# Patient Record
Sex: Female | Born: 1935 | Race: White | Hispanic: No | Smoking: Never smoker
Health system: Southern US, Community
[De-identification: ages and names within clinical notes are randomized; demographics above are authoritative.]

## PROBLEM LIST (undated history)

## (undated) DIAGNOSIS — K5900 Constipation, unspecified: Secondary | ICD-10-CM

## (undated) DIAGNOSIS — IMO0002 Reserved for concepts with insufficient information to code with codable children: Secondary | ICD-10-CM

## (undated) DIAGNOSIS — R5383 Other fatigue: Secondary | ICD-10-CM

## (undated) DIAGNOSIS — I4891 Unspecified atrial fibrillation: Secondary | ICD-10-CM

## (undated) DIAGNOSIS — I739 Peripheral vascular disease, unspecified: Secondary | ICD-10-CM

## (undated) DIAGNOSIS — N951 Menopausal and female climacteric states: Secondary | ICD-10-CM

## (undated) DIAGNOSIS — I10 Essential (primary) hypertension: Secondary | ICD-10-CM

## (undated) DIAGNOSIS — E785 Hyperlipidemia, unspecified: Secondary | ICD-10-CM

## (undated) DIAGNOSIS — N362 Urethral caruncle: Secondary | ICD-10-CM

## (undated) DIAGNOSIS — I493 Ventricular premature depolarization: Secondary | ICD-10-CM

## (undated) DIAGNOSIS — D649 Anemia, unspecified: Secondary | ICD-10-CM

## (undated) DIAGNOSIS — I495 Sick sinus syndrome: Secondary | ICD-10-CM

## (undated) DIAGNOSIS — C4491 Basal cell carcinoma of skin, unspecified: Secondary | ICD-10-CM

## (undated) DIAGNOSIS — R339 Retention of urine, unspecified: Secondary | ICD-10-CM

## (undated) DIAGNOSIS — R002 Palpitations: Secondary | ICD-10-CM

## (undated) DIAGNOSIS — I491 Atrial premature depolarization: Secondary | ICD-10-CM

## (undated) DIAGNOSIS — R194 Change in bowel habit: Secondary | ICD-10-CM

## (undated) DIAGNOSIS — N6019 Diffuse cystic mastopathy of unspecified breast: Secondary | ICD-10-CM

## (undated) DIAGNOSIS — B019 Varicella without complication: Secondary | ICD-10-CM

## (undated) DIAGNOSIS — I499 Cardiac arrhythmia, unspecified: Secondary | ICD-10-CM

## (undated) DIAGNOSIS — R634 Abnormal weight loss: Secondary | ICD-10-CM

## (undated) DIAGNOSIS — R3915 Urgency of urination: Secondary | ICD-10-CM

## (undated) DIAGNOSIS — M76899 Other specified enthesopathies of unspecified lower limb, excluding foot: Secondary | ICD-10-CM

## (undated) DIAGNOSIS — J309 Allergic rhinitis, unspecified: Secondary | ICD-10-CM

## (undated) DIAGNOSIS — B029 Zoster without complications: Secondary | ICD-10-CM

## (undated) HISTORY — DX: Menopausal and female climacteric states: N95.1

## (undated) HISTORY — PX: ABLATION: SHX5711

## (undated) HISTORY — DX: Reserved for concepts with insufficient information to code with codable children: IMO0002

## (undated) HISTORY — DX: Sick sinus syndrome: I49.5

## (undated) HISTORY — PX: HIP SURGERY: SHX245

## (undated) HISTORY — PX: CHOLECYSTECTOMY: SHX55

## (undated) HISTORY — DX: Hyperlipidemia, unspecified: E78.5

## (undated) HISTORY — DX: Cardiac arrhythmia, unspecified: I49.9

## (undated) HISTORY — DX: Other specified enthesopathies of unspecified lower limb, excluding foot: M76.899

## (undated) HISTORY — DX: Retention of urine, unspecified: R33.9

## (undated) HISTORY — DX: Other fatigue: R53.83

## (undated) HISTORY — PX: APPENDECTOMY: SHX54

## (undated) HISTORY — DX: Allergic rhinitis, unspecified: J30.9

## (undated) HISTORY — PX: CARDIAC SURGERY: SHX584

## (undated) HISTORY — DX: Zoster without complications: B02.9

## (undated) HISTORY — DX: Constipation, unspecified: K59.00

## (undated) HISTORY — DX: Urgency of urination: R39.15

## (undated) HISTORY — DX: Urethral caruncle: N36.2

## (undated) HISTORY — DX: Change in bowel habit: R19.4

## (undated) HISTORY — PX: BREAST CYST ASPIRATION: SHX578

## (undated) HISTORY — PX: BASAL CELL CARCINOMA EXCISION: SHX1214

## (undated) HISTORY — PX: TOTAL ABDOMINAL HYSTERECTOMY W/ BILATERAL SALPINGOOPHORECTOMY: SHX83

## (undated) HISTORY — DX: Diffuse cystic mastopathy of unspecified breast: N60.19

## (undated) HISTORY — DX: Basal cell carcinoma of skin, unspecified: C44.91

## (undated) HISTORY — DX: Palpitations: R00.2

## (undated) HISTORY — PX: CARDIOVERSION: SHX1299

## (undated) HISTORY — DX: Atrial premature depolarization: I49.1

## (undated) HISTORY — DX: Unspecified atrial fibrillation: I48.91

## (undated) HISTORY — DX: Anemia, unspecified: D64.9

## (undated) HISTORY — DX: Abnormal weight loss: R63.4

## (undated) HISTORY — DX: Ventricular premature depolarization: I49.3

## (undated) HISTORY — DX: Varicella without complication: B01.9

## (undated) HISTORY — PX: WISDOM TOOTH EXTRACTION: SHX21

## (undated) HISTORY — DX: Peripheral vascular disease, unspecified: I73.9

## (undated) HISTORY — DX: Essential (primary) hypertension: I10

## (undated) HISTORY — PX: WRIST FRACTURE SURGERY: SHX121

---

## 2004-04-09 ENCOUNTER — Ambulatory Visit: Payer: Self-pay | Admitting: Gastroenterology

## 2004-11-10 ENCOUNTER — Ambulatory Visit: Payer: Self-pay | Admitting: Unknown Physician Specialty

## 2005-12-07 ENCOUNTER — Ambulatory Visit: Payer: Self-pay | Admitting: Unknown Physician Specialty

## 2007-01-10 ENCOUNTER — Ambulatory Visit: Payer: Self-pay | Admitting: Unknown Physician Specialty

## 2007-04-29 DIAGNOSIS — B019 Varicella without complication: Secondary | ICD-10-CM

## 2007-04-29 DIAGNOSIS — B029 Zoster without complications: Secondary | ICD-10-CM

## 2007-04-29 HISTORY — DX: Varicella without complication: B01.9

## 2007-04-29 HISTORY — DX: Zoster without complications: B02.9

## 2008-01-24 ENCOUNTER — Ambulatory Visit: Payer: Self-pay | Admitting: Unknown Physician Specialty

## 2009-02-02 ENCOUNTER — Ambulatory Visit: Payer: Self-pay | Admitting: Unknown Physician Specialty

## 2010-02-08 ENCOUNTER — Ambulatory Visit: Payer: Self-pay | Admitting: Unknown Physician Specialty

## 2011-04-19 ENCOUNTER — Ambulatory Visit: Payer: Self-pay | Admitting: Unknown Physician Specialty

## 2012-04-20 ENCOUNTER — Ambulatory Visit: Payer: Self-pay | Admitting: Physician Assistant

## 2012-09-18 DIAGNOSIS — C44310 Basal cell carcinoma of skin of unspecified parts of face: Secondary | ICD-10-CM | POA: Insufficient documentation

## 2013-03-28 HISTORY — PX: CATARACT EXTRACTION: SUR2

## 2013-04-04 ENCOUNTER — Ambulatory Visit: Payer: Self-pay | Admitting: Ophthalmology

## 2013-04-04 DIAGNOSIS — I10 Essential (primary) hypertension: Secondary | ICD-10-CM

## 2013-04-24 ENCOUNTER — Ambulatory Visit: Payer: Self-pay | Admitting: Physician Assistant

## 2013-05-08 ENCOUNTER — Ambulatory Visit: Payer: Self-pay | Admitting: Ophthalmology

## 2013-09-10 ENCOUNTER — Ambulatory Visit: Payer: Self-pay | Admitting: Ophthalmology

## 2014-04-23 DIAGNOSIS — I480 Paroxysmal atrial fibrillation: Secondary | ICD-10-CM | POA: Insufficient documentation

## 2014-04-23 DIAGNOSIS — E782 Mixed hyperlipidemia: Secondary | ICD-10-CM | POA: Insufficient documentation

## 2014-04-23 DIAGNOSIS — I340 Nonrheumatic mitral (valve) insufficiency: Secondary | ICD-10-CM | POA: Insufficient documentation

## 2014-05-22 ENCOUNTER — Ambulatory Visit: Payer: Self-pay | Admitting: Physician Assistant

## 2014-07-19 NOTE — Op Note (Signed)
PATIENT NAME:  Lisa [name], Lisa [name] MR#:  [id number] DATE OF BIRTH:  [birth year]  DATE OF PROCEDURE:  05/08/2013  PREOPERATIVE DIAGNOSIS:  Senile cataract left eye.  POSTOPERATIVE DIAGNOSIS:  Senile cataract left eye.  PROCEDURE:  Phacoemulsification with posterior chamber intraocular lens implantation of the left eye.  LENS:  ZCB00 22.0-diopter posterior chamber intraocular lens.  ULTRASOUND TIME:  16% of 1 minute, 42 seconds.  CDE 16.0  SURGEON:  Mali Bethanee Redondo, MD  ANESTHESIA:  Topical with tetracaine drops and 2% Xylocaine jelly.  COMPLICATIONS:  None.  DESCRIPTION OF PROCEDURE:  The patient was identified in the holding room and transported to the operating room and placed in the supine position under the operating microscope.  The left eye was identified as the operative eye and it was prepped and draped in the usual sterile ophthalmic fashion.  A 1 millimeter clear-corneal paracentesis was made at the 1:30 position.  The anterior chamber was filled with Viscoat viscoelastic.  A 2.4 millimeter keratome was used to make a near-clear corneal incision at the 10:30 position.  A curvilinear capsulorrhexis was made with a cystotome and capsulorrhexis forceps.  Balanced salt solution was used to hydrodissect and hydrodelineate the nucleus.  Phacoemulsification was then used in stop and chop fashion to remove the lens nucleus and epinucleus.  The remaining cortex was then removed using the irrigation and aspiration handpiece. Provisc was then placed into the capsular bag to distend it for lens placement.  A ZCB00 22.0-diopter lens was then injected into the capsular bag.  The remaining viscoelastic was aspirated.  Wounds were hydrated with balanced salt solution.  The anterior chamber was inflated to a physiologic pressure with balanced salt solution.  0.1 mL of cefuroxime 10 mg/mL were injected into the anterior chamber for a dose of 1 mg of intracameral antibiotic at the completion of the case.  Miostat was placed into the anterior chamber to constrict the pupil.  No wound leaks were noted.  Topical Vigamox drops and Maxitrol ointment were applied to the eye.  The patient was taken to the recovery room in stable condition without complications of anesthesia or surgery.    ____________________________ Wyonia Hough, MD crb:dp D: 05/08/2013 14:42:57 ET T: 05/08/2013 15:25:30 ET JOB#: [id number]  cc: Wyonia Hough, MD, <Dictator> Leandrew Koyanagi MD ELECTRONICALLY SIGNED 05/15/2013 12:12

## 2014-07-19 NOTE — Op Note (Signed)
PATIENT NAME:  Lisa [name], Lisa [name] MR#:  [id number] DATE OF BIRTH:  1928/11/27  DATE OF PROCEDURE:  09/10/2013  PREOPERATIVE DIAGNOSIS:  Senile cataract right eye.  POSTOPERATIVE DIAGNOSIS:  Senile cataract right eye.  PROCEDURE:  Phacoemulsification with posterior chamber intraocular lens implantation of the right eye.  LENS IMPLANT:  ZCB00, 22.5-diopter posterior chamber intraocular lens.  ULTRASOUND TIME:  11% of  1 minute 2 seconds.  CDE 6.6.  SURGEON:  Mali Brasington, MD  ANESTHESIA:  Topical with tetracaine drops and 2% Xylocaine jelly.  COMPLICATIONS:  None.  DESCRIPTION OF PROCEDURE:  The patient was identified in the holding room and transported to the operating room and placed in the supine position under the operating microscope.  The right eye was identified as the operative eye and it was prepped and draped in the usual sterile ophthalmic fashion.  A 1 millimeter clear-corneal paracentesis was made at the 12 o'clock position.  The anterior chamber was filled with Viscoat  viscoelastic.  A 2.4 millimeter keratome was used to make a near-clear corneal incision at the  9 o'clock  position.  A curvilinear capsulorrhexis was made with a cystotome and capsulorrhexis forceps.  Balanced salt solution was used to hydrodissect and hydrodelineate the nucleus.  Phacoemulsification was then used in stop and chop fashion to remove the lens nucleus and epinucleus.  The remaining cortex was then removed using the irrigation and aspiration handpiece. Provisc was then placed into the capsular bag to distend it for lens placement.  A ZCB00, 22.5-diopter lens was then injected into the capsular bag.  The remaining viscoelastic was aspirated.  Wounds were hydrated with balanced salt solution.  The anterior chamber was inflated to a physiologic pressure with balanced salt solution.  0.1 mL of cefuroxime 10 mg/mL were injected into the anterior chamber for a dose of 1 mg of intracameral antibiotic at  the completion of the case. Miostat was placed into the anterior chamber to constrict the pupil.  No wound leaks were noted.  Topical Vigamox drops and Maxitrol ointment were applied to the eye.    The patient was taken to the recovery room in stable condition without complications of anesthesia or surgery.     ____________________________ Wyonia Hough, MD crb:dmm D: 09/10/2013 12:09:48 ET T: 09/10/2013 12:23:21 ET JOB#: [id number]  cc: Wyonia Hough, MD, <Dictator> Leandrew Koyanagi MD ELECTRONICALLY SIGNED 09/11/2013 9:38

## 2014-11-13 ENCOUNTER — Encounter: Payer: Self-pay | Admitting: Obstetrics and Gynecology

## 2014-11-13 ENCOUNTER — Ambulatory Visit (INDEPENDENT_AMBULATORY_CARE_PROVIDER_SITE_OTHER): Payer: 59 | Admitting: Obstetrics and Gynecology

## 2014-11-13 VITALS — BP 127/71 | HR 65 | Ht 62.5 in | Wt 133.0 lb

## 2014-11-13 DIAGNOSIS — R339 Retention of urine, unspecified: Secondary | ICD-10-CM | POA: Diagnosis not present

## 2014-11-13 DIAGNOSIS — N8110 Cystocele, unspecified: Secondary | ICD-10-CM | POA: Diagnosis not present

## 2014-11-13 DIAGNOSIS — D649 Anemia, unspecified: Secondary | ICD-10-CM | POA: Insufficient documentation

## 2014-11-13 DIAGNOSIS — I493 Ventricular premature depolarization: Secondary | ICD-10-CM | POA: Insufficient documentation

## 2014-11-13 DIAGNOSIS — R002 Palpitations: Secondary | ICD-10-CM | POA: Insufficient documentation

## 2014-11-13 DIAGNOSIS — I10 Essential (primary) hypertension: Secondary | ICD-10-CM | POA: Insufficient documentation

## 2014-11-13 DIAGNOSIS — I491 Atrial premature depolarization: Secondary | ICD-10-CM | POA: Insufficient documentation

## 2014-11-13 DIAGNOSIS — N815 Vaginal enterocele: Secondary | ICD-10-CM

## 2014-11-13 DIAGNOSIS — IMO0002 Reserved for concepts with insufficient information to code with codable children: Secondary | ICD-10-CM

## 2014-11-13 DIAGNOSIS — I499 Cardiac arrhythmia, unspecified: Secondary | ICD-10-CM | POA: Insufficient documentation

## 2014-11-13 NOTE — Patient Instructions (Signed)
1.  Continue with Premarin 0.3 mg 3 times a week. 2.  Continue with [place] gel weekly. 3.  Return in 3 months for recheck on pessary

## 2014-11-13 NOTE — Progress Notes (Signed)
Chief complaint: 1.  Pessary check.  The patient is an 81 year old white female status post TAH/BSO in 1975, with history of cystocele, incomplete bladder emptying, and vaginal enterocele, who presents for 3 month follow-up on pessary maintenance.  She is using a gelhorn pessary 2-3/4 inchWith success.  She is taking Premarin 0.3 mg orally 3 times a week and is using TRIMO san gel weekly. The patient denies any vaginal bleeding, discharge, or odor.  She is not experiencing any pelvic pain. She does state that occasionally it feels that the pessary slips to the left with some bladder prolapse.  Past Medical History  Diagnosis Date  . Hyperlipemia   . Hypertension   . Constipation   . Basal cell carcinoma   . Cystocele   . Incomplete bladder emptying   . Menopausal state   . Urethral caruncle   . Urinary urgency    Past Surgical History  Procedure Laterality Date  . Appendectomy    . Cholecystectomy    . Abdominal hysterectomy  1975    and bso  . Cataract extraction    . Basal cell carcinoma excision      Review of Systems  Constitutional: Negative.   HENT:       Recent I, tooth loss  Respiratory: Negative.   Gastrointestinal: Negative.   Genitourinary: Negative.   Musculoskeletal: Negative.   Skin: Negative.    OBJECTIVE: BP 127/71 mmHg  Pulse 65  Ht 5' 2.5" (1.588 m)  Wt 133 lb (60.328 kg)  BMI 23.92 kg/m2 Pleasant, well-appearing white female in no acute distress who appears younger than stated age. Abdomen soft, nontender. Pelvic exam: External genitalia-atrophic changes. BUS-normal. Vagina-vaginal mucosa appears healthy without evidence of ulceration or lesion; minimal white discharge noted; no odor. Cervix-surgically absent. Uterus-surgically absent. Rectovaginal-external exam normal.  Procedure: The gel horn pessary is removed, cleaned, and reinserted.  ASSESSMENT: 1.  Pelvic organ prolapse, stable with pessary use. 2.  Cystocele. 3.  Enterocele. 4.   Normal pessary check.-3 months.  PLAN: 1.  Continue with Premarin 0.3 mg 3 times a week. 2.  Continue with TRIMO san gel weekly 3.  Patient may attempt to adjust pessary if she notes a slipping of the pessary to the side with bladder prolapse. 4.  Return in 3 months for recheck.  Brayton Mars, MD

## 2014-11-13 NOTE — Progress Notes (Signed)
Patient ID: Lisa [name], female   DOB: Aug 08, 1928, 81 y.o.   MRN: [id number] 3 month pessary check No vb,vd, ordor, or pain trimosan- weekly No uti sx

## 2014-12-08 DIAGNOSIS — I6523 Occlusion and stenosis of bilateral carotid arteries: Secondary | ICD-10-CM | POA: Insufficient documentation

## 2015-02-10 ENCOUNTER — Other Ambulatory Visit: Payer: Self-pay | Admitting: Obstetrics and Gynecology

## 2015-02-12 ENCOUNTER — Ambulatory Visit (INDEPENDENT_AMBULATORY_CARE_PROVIDER_SITE_OTHER): Payer: 59 | Admitting: Obstetrics and Gynecology

## 2015-02-12 ENCOUNTER — Encounter: Payer: Self-pay | Admitting: Obstetrics and Gynecology

## 2015-02-12 VITALS — BP 133/84 | HR 65 | Ht 62.5 in | Wt 138.1 lb

## 2015-02-12 DIAGNOSIS — IMO0002 Reserved for concepts with insufficient information to code with codable children: Secondary | ICD-10-CM

## 2015-02-12 DIAGNOSIS — N8110 Cystocele, unspecified: Secondary | ICD-10-CM

## 2015-02-12 DIAGNOSIS — N815 Vaginal enterocele: Secondary | ICD-10-CM | POA: Diagnosis not present

## 2015-02-12 DIAGNOSIS — R339 Retention of urine, unspecified: Secondary | ICD-10-CM | POA: Diagnosis not present

## 2015-02-12 DIAGNOSIS — K469 Unspecified abdominal hernia without obstruction or gangrene: Secondary | ICD-10-CM

## 2015-02-12 NOTE — Patient Instructions (Signed)
1 

## 2015-02-12 NOTE — Progress Notes (Signed)
Patient ID: Lisa [name], female   DOB: 06/02/1928, 81 y.o.   MRN: [id number]   Chief complaint: 1. Pessary check 2. History of incomplete bladder emptying 3. History of cystocele, vaginal enterocele  3 month pessary check-2 and three-quarter inch Gellhorn pessary uti- 3 weeks ago treated with cipro at pcp- no sx today No vb, pain, d/c  Past medical history, past surgical history, problem list, medications, and allergies are reviewed. Status postTAH/BSO   Review of systems: Per history of present illness  OBJECTIVE: BP 133/84 mmHg  Pulse 65  Ht 5' 2.5" (1.588 m)  Wt 138 lb 1.6 oz (62.642 kg)  BMI 24.84 kg/m2  Pleasant, well-appearing white female in no acute distress who appears younger than stated age. Abdomen soft, nontender. Pelvic exam: External genitalia-atrophic changes. BUS-normal. Vagina-vaginal mucosa appears healthy without evidence of ulceration or lesion; minimal white discharge noted; no odor. Cervix-surgically absent. Uterus-surgically absent. Rectovaginal-external exam normal.  Procedure: The gel horn pessary is removed, cleaned, and reinserted.  ASSESSMENT: 1. Pelvic organ prolapse, stable with pessary use. 2. Cystocele. 3. Enterocele. 4. Normal pessary check.-3 months.  PLAN: 1. Continue with Premarin 0.3 mg 3 times a week. 2. Continue with TRIMO san gel weekly 3. Patient may attempt to adjust pessary if she notes a slipping of the pessary to the side with bladder prolapse. 4. Return in 3 months for recheck.  Brayton Mars, MD  Note: This dictation was prepared with Dragon dictation along with smaller phrase technology. Any transcriptional errors that result from this process are unintentional.

## 2015-03-03 ENCOUNTER — Telehealth: Payer: Self-pay | Admitting: Obstetrics and Gynecology

## 2015-03-03 NOTE — Telephone Encounter (Signed)
[facility] [address] told this pt that Dr Tennis Must needed to call ins company in order for her Premarin to be filled.

## 2015-03-03 NOTE — Telephone Encounter (Signed)
Pt was advised her insurance will not pay for premarin tablets. Advised per mad she may try to pay OOP for premarin. If not she can make an appt to discuss alternatives that her insurance will pay for. Pt will call me back with her decision.

## 2015-05-14 ENCOUNTER — Ambulatory Visit (INDEPENDENT_AMBULATORY_CARE_PROVIDER_SITE_OTHER): Payer: 59 | Admitting: Obstetrics and Gynecology

## 2015-05-14 ENCOUNTER — Encounter: Payer: Self-pay | Admitting: Obstetrics and Gynecology

## 2015-05-14 VITALS — BP 155/89 | HR 86 | Ht 62.5 in | Wt 142.7 lb

## 2015-05-14 DIAGNOSIS — N815 Vaginal enterocele: Secondary | ICD-10-CM | POA: Diagnosis not present

## 2015-05-14 DIAGNOSIS — R339 Retention of urine, unspecified: Secondary | ICD-10-CM

## 2015-05-14 DIAGNOSIS — N8110 Cystocele, unspecified: Secondary | ICD-10-CM

## 2015-05-14 DIAGNOSIS — IMO0002 Reserved for concepts with insufficient information to code with codable children: Secondary | ICD-10-CM

## 2015-05-14 NOTE — Patient Instructions (Addendum)
1. Continue with Premarin 0.3 mg 3 times a week. 2. Continue with TRIMO san gel weekly 3. Patient may attempt to adjust pessary if she notes a slipping of the pessary to the side with bladder prolapse. 4. Return in 3 months for recheck.

## 2015-05-14 NOTE — Progress Notes (Signed)
Chief complaint: 1. Pessary check 2. History of incomplete bladder emptying 3. History of cystocele, vaginal enterocele  3 month pessary check-2 and three-quarter inch Gellhorn pessary No vb, pain, d/c  Past medical history, past surgical history, problem list, medications, and allergies are reviewed. Status postTAH/BSO   Review of systems: Per history of present illness  OBJECTIVE: BP 155/89 mmHg  Pulse 86  Ht 5' 2.5" (1.588 m)  Wt 142 lb 11.2 oz (64.728 kg)  BMI 25.67 kg/m2  Pleasant, well-appearing white female in no acute distress who appears younger than stated age. Abdomen soft, nontender. Pelvic exam: External genitalia-atrophic changes. BUS-Urethral caruncle present Vagina-vaginal mucosa appears healthy without evidence of ulceration or lesion; minimal white discharge noted; no odor. Cervix-surgically absent. Uterus-surgically absent. Rectovaginal-external exam normal.  Procedure: The gel horn pessary is removed, cleaned, and reinserted.  ASSESSMENT: 1. Pelvic organ prolapse, stable with pessary use. 2. Cystocele. 3. Enterocele. 4. Normal pessary check.-3 months.  PLAN: 1. Patient stopped Premarin due to cost 2. Continue with TRIMO san gel weekly 3. Patient may attempt to adjust pessary if she notes a slipping of the pessary to the side with bladder prolapse. 4. Return in 3 months for recheck.  A total of 15 minutes were spent face-to-face with the patient during this encounter and over half of that time dealt with counseling and coordination of care.   Brayton Mars, MD  Note: This dictation was prepared with Dragon dictation along with smaller phrase technology. Any transcriptional errors that result from this process are unintentional.

## 2015-07-22 ENCOUNTER — Other Ambulatory Visit: Payer: Self-pay | Admitting: Physician Assistant

## 2015-07-22 DIAGNOSIS — Z1231 Encounter for screening mammogram for malignant neoplasm of breast: Secondary | ICD-10-CM

## 2015-08-03 ENCOUNTER — Ambulatory Visit
Admission: RE | Admit: 2015-08-03 | Discharge: 2015-08-03 | Disposition: A | Discharge status: Home / Self Care | Payer: Medicare Other | Source: Ambulatory Visit | Attending: Physician Assistant | Admitting: Physician Assistant

## 2015-08-03 ENCOUNTER — Other Ambulatory Visit: Payer: Self-pay | Admitting: Physician Assistant

## 2015-08-03 DIAGNOSIS — Z1231 Encounter for screening mammogram for malignant neoplasm of breast: Secondary | ICD-10-CM

## 2015-08-13 ENCOUNTER — Encounter: Payer: Self-pay | Admitting: Obstetrics and Gynecology

## 2015-08-13 ENCOUNTER — Ambulatory Visit (INDEPENDENT_AMBULATORY_CARE_PROVIDER_SITE_OTHER): Payer: Medicare Other | Admitting: Obstetrics and Gynecology

## 2015-08-13 VITALS — BP 127/74 | HR 71 | Ht 64.5 in | Wt 145.5 lb

## 2015-08-13 DIAGNOSIS — R339 Retention of urine, unspecified: Secondary | ICD-10-CM | POA: Diagnosis not present

## 2015-08-13 DIAGNOSIS — N815 Vaginal enterocele: Secondary | ICD-10-CM

## 2015-08-13 DIAGNOSIS — Z4689 Encounter for fitting and adjustment of other specified devices: Secondary | ICD-10-CM

## 2015-08-13 DIAGNOSIS — N8110 Cystocele, unspecified: Secondary | ICD-10-CM

## 2015-08-13 DIAGNOSIS — IMO0002 Reserved for concepts with insufficient information to code with codable children: Secondary | ICD-10-CM

## 2015-08-13 NOTE — Progress Notes (Signed)
Chief complaint: 1. Pessary check 2. History of incomplete bladder emptying 3. History of cystocele, vaginal enterocele  3 month pessary check-2 and three-quarter inch Gellhorn pessary No vb, pain, d/c. Patient reports occasional slippage of bladder  around pessary ; she is dealing with this.  Past medical history, past surgical history, problem list, medications, and allergies are reviewed. Status postTAH/BSO   Review of systems: Per history of present illness   OBJECTIVE : BP 127/74 mmHg  Pulse 71  Ht 5' 4.5" (1.638 m)  Wt 145 lb 8 oz (65.998 kg)  BMI 24.60 kg/m2  Pleasant, well-appearing white female in no acute distress who appears younger than stated age. Abdomen soft, nontender. Pelvic exam: External genitalia-atrophic changes. BUS-Urethral caruncle present Vagina-vaginal mucosa appears healthy without evidence of ulceration or lesion; minimal white discharge noted; no odor. Cervix-surgically absent. Uterus-surgically absent. Rectovaginal-external exam normal.  Procedure: The gel horn pessary is removed, cleaned, and reinserted.  ASSESSMENT: 1. Pelvic organ prolapse, stable with pessary use. 2. Cystocele. 3. Enterocele. 4. Normal pessary check.-3 months.   PLAN: 1. Continue Trimosan gel weekly  2. Return in 3 months for follow-up pessary maintenance  3. Discussed the issue of estrogen replacement therapy. Patient understands that estradiol 1 mg a day may be acceptable (cost wise) since discontinuing her Premarin 6 months ago. She would need to discuss the impact of estradiol on her atrial fibrillation condition that has recently been diagnosed. Cardiology may not want her to be on medication if her A. Fib is not well controlled.  A total of 15 minutes were spent face-to-face with the patient during this encounter and over half of that time dealt with counseling and coordination of care.  Brayton Mars, MD  Note: This dictation was prepared with Dragon  dictation along with smaller phrase technology. Any transcriptional errors that result from this process are unintentional.

## 2015-08-13 NOTE — Patient Instructions (Addendum)
1. Return in 3 months for pessary maintenance  2. Patient may consider discussing estrogen replacement therapy with her cardiologist, being that she has not felt as well since coming off of her estrogen  6 months ago.

## 2015-09-02 ENCOUNTER — Ambulatory Visit
Admission: RE | Admit: 2015-09-02 | Discharge: 2015-09-02 | Disposition: A | Discharge status: Home / Self Care | Payer: Medicare Other | Source: Ambulatory Visit | Attending: Internal Medicine | Admitting: Internal Medicine

## 2015-09-02 ENCOUNTER — Ambulatory Visit: Payer: Medicare Other | Admitting: Registered Nurse

## 2015-09-02 ENCOUNTER — Encounter: Payer: Self-pay | Admitting: *Deleted

## 2015-09-02 ENCOUNTER — Encounter
Admission: RE | Disposition: A | Discharge status: Home / Self Care | Payer: Self-pay | Source: Ambulatory Visit | Attending: Internal Medicine

## 2015-09-02 DIAGNOSIS — D649 Anemia, unspecified: Secondary | ICD-10-CM | POA: Diagnosis not present

## 2015-09-02 DIAGNOSIS — N6019 Diffuse cystic mastopathy of unspecified breast: Secondary | ICD-10-CM | POA: Insufficient documentation

## 2015-09-02 DIAGNOSIS — Z9842 Cataract extraction status, left eye: Secondary | ICD-10-CM | POA: Diagnosis not present

## 2015-09-02 DIAGNOSIS — Z90710 Acquired absence of both cervix and uterus: Secondary | ICD-10-CM | POA: Insufficient documentation

## 2015-09-02 DIAGNOSIS — Z8619 Personal history of other infectious and parasitic diseases: Secondary | ICD-10-CM | POA: Diagnosis not present

## 2015-09-02 DIAGNOSIS — Z7901 Long term (current) use of anticoagulants: Secondary | ICD-10-CM | POA: Insufficient documentation

## 2015-09-02 DIAGNOSIS — E784 Other hyperlipidemia: Secondary | ICD-10-CM | POA: Insufficient documentation

## 2015-09-02 DIAGNOSIS — Z79899 Other long term (current) drug therapy: Secondary | ICD-10-CM | POA: Diagnosis not present

## 2015-09-02 DIAGNOSIS — Z803 Family history of malignant neoplasm of breast: Secondary | ICD-10-CM | POA: Diagnosis not present

## 2015-09-02 DIAGNOSIS — Z9049 Acquired absence of other specified parts of digestive tract: Secondary | ICD-10-CM | POA: Insufficient documentation

## 2015-09-02 DIAGNOSIS — Z8249 Family history of ischemic heart disease and other diseases of the circulatory system: Secondary | ICD-10-CM | POA: Diagnosis not present

## 2015-09-02 DIAGNOSIS — I495 Sick sinus syndrome: Secondary | ICD-10-CM | POA: Diagnosis not present

## 2015-09-02 DIAGNOSIS — Z9841 Cataract extraction status, right eye: Secondary | ICD-10-CM | POA: Insufficient documentation

## 2015-09-02 DIAGNOSIS — Z801 Family history of malignant neoplasm of trachea, bronchus and lung: Secondary | ICD-10-CM | POA: Diagnosis not present

## 2015-09-02 DIAGNOSIS — M792 Neuralgia and neuritis, unspecified: Secondary | ICD-10-CM | POA: Diagnosis not present

## 2015-09-02 DIAGNOSIS — Z8049 Family history of malignant neoplasm of other genital organs: Secondary | ICD-10-CM | POA: Diagnosis not present

## 2015-09-02 DIAGNOSIS — Z9889 Other specified postprocedural states: Secondary | ICD-10-CM | POA: Insufficient documentation

## 2015-09-02 DIAGNOSIS — I10 Essential (primary) hypertension: Secondary | ICD-10-CM | POA: Diagnosis not present

## 2015-09-02 DIAGNOSIS — I480 Paroxysmal atrial fibrillation: Secondary | ICD-10-CM | POA: Diagnosis present

## 2015-09-02 HISTORY — PX: ELECTROPHYSIOLOGIC STUDY: SHX172A

## 2015-09-02 SURGERY — CARDIOVERSION (CATH LAB)
Anesthesia: General

## 2015-09-02 MED ORDER — SODIUM CHLORIDE 0.9 % IV SOLN
INTRAVENOUS | Status: DC
  Administered 2015-09-02: 08:00:00 via INTRAVENOUS

## 2015-09-02 MED ORDER — PROPOFOL 10 MG/ML IV BOLUS
INTRAVENOUS | Status: DC | PRN
  Administered 2015-09-02: 40 mg via INTRAVENOUS
  Administered 2015-09-02: 10 mg via INTRAVENOUS

## 2015-09-02 NOTE — Anesthesia Postprocedure Evaluation (Signed)
Anesthesia Post Note  Patient: [name]  Procedure(s) Performed: Procedure(s) (LRB): Cardioversion (N/A)  Patient location during evaluation: PACU Anesthesia Type: General Level of consciousness: awake and alert Pain management: pain level controlled Vital Signs Assessment: post-procedure vital signs reviewed and stable Respiratory status: spontaneous breathing, nonlabored ventilation, respiratory function stable and patient connected to nasal cannula oxygen Cardiovascular status: blood pressure returned to baseline and stable Postop Assessment: no signs of nausea or vomiting Anesthetic complications: no    Last Vitals:  Filed Vitals:   09/02/15 0807 09/02/15 0823  BP:  136/81  Pulse: 65 70  Temp:    Resp: 13 18    Last Pain: There were no vitals filed for this visit.               Martha Clan

## 2015-09-02 NOTE — Discharge Instructions (Signed)
Electrical Cardioversion, Care After °Refer to this sheet in the next few weeks. These instructions provide you with information on caring for yourself after your procedure. Your health care provider may also give you more specific instructions. Your treatment has been planned according to current medical practices, but problems sometimes occur. Call your health care provider if you have any problems or questions after your procedure. °WHAT TO EXPECT AFTER THE PROCEDURE °After your procedure, it is typical to have the following sensations: °· Some redness on the skin where the shocks were delivered. If this is tender, a sunburn lotion or hydrocortisone cream may help. °· Possible return of an abnormal heart rhythm within hours or days after the procedure. °HOME CARE INSTRUCTIONS °· Take medicines only as directed by your health care provider. Be sure you understand how and when to take your medicine. °· Learn how to feel your pulse and check it often. °· Limit your activity for 48 hours after the procedure or as directed by your health care provider. °· Avoid or minimize caffeine and other stimulants as directed by your health care provider. °SEEK MEDICAL CARE IF: °· You feel like your heart is beating too fast or your pulse is not regular. °· You have any questions about your medicines. °· You have bleeding that will not stop. °SEEK IMMEDIATE MEDICAL CARE IF: °· You are dizzy or feel faint. °· It is hard to breathe or you feel short of breath. °· There is a change in discomfort in your chest. °· Your speech is slurred or you have trouble moving an arm or leg on one side of your body. °· You get a serious muscle cramp that does not go away. °· Your fingers or toes turn cold or blue. °  °This information is not intended to replace advice given to you by your health care provider. Make sure you discuss any questions you have with your health care provider. °  °Document Released: 01/02/2013 Document Revised: 04/04/2014  Document Reviewed: 01/02/2013 °Elsevier Interactive Patient Education ©2016 [employer] ° °

## 2015-09-02 NOTE — CV Procedure (Signed)
Electrical Cardioversion Procedure Note MAUDA MARKIN [id number] [birth year]  Procedure: Electrical Cardioversion Indications:  Atrial Fibrillation  Procedure Details Consent: Risks of procedure as well as the alternatives and risks of each were explained to the (patient/caregiver).  Consent for procedure obtained. Time Out: Verified patient identification, verified procedure, site/side was marked, verified correct patient position, special equipment/implants available, medications/allergies/relevent history reviewed, required imaging and test results available.  Performed  Patient placed on cardiac monitor, pulse oximetry, supplemental oxygen as necessary.  Sedation given: Benzodiazepines and Short-acting barbiturates Pacer pads placed anterior and posterior chest.  Cardioverted 1 time(s).  Cardioverted at 120J.  Evaluation Findings: Post procedure EKG shows: NSR Complications: None Patient did tolerate procedure well.   Corey Skains 09/02/2015, 7:52 AM

## 2015-09-02 NOTE — Anesthesia Procedure Notes (Signed)
Date/Time: 09/02/2015 7:44 AM Performed by: Doreen Salvage Pre-anesthesia Checklist: Patient identified, Emergency Drugs available, Suction available and Patient being monitored Patient Re-evaluated:Patient Re-evaluated prior to inductionOxygen Delivery Method: Nasal cannula Intubation Type: IV induction Dental Injury: Teeth and Oropharynx as per pre-operative assessment  Comments: Nasal cannula with etCO2 monitoring

## 2015-09-02 NOTE — Transfer of Care (Signed)
Immediate Anesthesia Transfer of Care Note  Patient: Lisa [name]  Procedure(s) Performed: Procedure(s): Cardioversion (N/A)  Patient Location: PACU and Short Stay  Anesthesia Type:General  Level of Consciousness: awake, alert  and oriented  Airway & Oxygen Therapy: Patient Spontanous Breathing and Patient connected to nasal cannula oxygen  Post-op Assessment: Report given to RN and Post -op Vital signs reviewed and stable  Post vital signs: Reviewed and stable  Last Vitals:  Filed Vitals:   09/02/15 0753 09/02/15 0754  BP:  128/68  Pulse: 66 68  Temp:    Resp: 16 17    Complications: No apparent anesthesia complications

## 2015-09-02 NOTE — Anesthesia Preprocedure Evaluation (Signed)
Anesthesia Evaluation  Patient identified by MRN, date of birth, ID band Patient awake    Reviewed: Allergy & Precautions, H&P , NPO status , Patient's Chart, lab work & pertinent test results, reviewed documented beta blocker date and time   History of Anesthesia Complications Negative for: history of anesthetic complications  Airway Mallampati: III  TM Distance: >3 FB Neck ROM: full    Dental no notable dental hx. (+) Implants, Teeth Intact   Pulmonary shortness of breath (since a-fib started) and with exertion, neg sleep apnea, neg COPD, neg recent URI,    Pulmonary exam normal breath sounds clear to auscultation       Cardiovascular Exercise Tolerance: Good hypertension, (-) angina(-) CAD, (-) Past MI, (-) Cardiac Stents and (-) CABG Normal cardiovascular exam+ dysrhythmias Atrial Fibrillation + Valvular Problems/Murmurs  Rhythm:regular Rate:Normal     Neuro/Psych negative neurological ROS  negative psych ROS   GI/Hepatic negative GI ROS, Neg liver ROS,   Endo/Other  negative endocrine ROS  Renal/GU negative Renal ROS  negative genitourinary   Musculoskeletal   Abdominal   Peds  Hematology negative hematology ROS (+)   Anesthesia Other Findings Past Medical History:   Hyperlipemia                                                 Hypertension                                                 Constipation                                                 Cystocele                                                    Incomplete bladder emptying                                  Menopausal state                                             Urethral caruncle                                            Urinary urgency                                              A-fib (HCC)  Basal cell carcinoma                                         Reproductive/Obstetrics negative OB  ROS                             Anesthesia Physical Anesthesia Plan  ASA: II  Anesthesia Plan: General   Post-op Pain Management:    Induction:   Airway Management Planned:   Additional Equipment:   Intra-op Plan:   Post-operative Plan:   Informed Consent: I have reviewed the patients History and Physical, chart, labs and discussed the procedure including the risks, benefits and alternatives for the proposed anesthesia with the patient or authorized representative who has indicated his/her understanding and acceptance.   Dental Advisory Given  Plan Discussed with: Anesthesiologist, CRNA and Surgeon  Anesthesia Plan Comments:         Anesthesia Quick Evaluation

## 2015-09-03 ENCOUNTER — Encounter: Payer: Self-pay | Admitting: Internal Medicine

## 2015-10-08 ENCOUNTER — Ambulatory Visit: Payer: Medicare Other | Admitting: Anesthesiology

## 2015-10-08 ENCOUNTER — Ambulatory Visit
Admission: RE | Admit: 2015-10-08 | Discharge: 2015-10-08 | Disposition: A | Discharge status: Home / Self Care | Payer: Medicare Other | Source: Ambulatory Visit | Attending: Internal Medicine | Admitting: Internal Medicine

## 2015-10-08 ENCOUNTER — Encounter: Payer: Self-pay | Admitting: *Deleted

## 2015-10-08 ENCOUNTER — Encounter
Admission: RE | Disposition: A | Discharge status: Home / Self Care | Payer: Self-pay | Source: Ambulatory Visit | Attending: Internal Medicine

## 2015-10-08 DIAGNOSIS — Z9842 Cataract extraction status, left eye: Secondary | ICD-10-CM | POA: Insufficient documentation

## 2015-10-08 DIAGNOSIS — Z9841 Cataract extraction status, right eye: Secondary | ICD-10-CM | POA: Diagnosis not present

## 2015-10-08 DIAGNOSIS — Z8619 Personal history of other infectious and parasitic diseases: Secondary | ICD-10-CM | POA: Diagnosis not present

## 2015-10-08 DIAGNOSIS — Z9049 Acquired absence of other specified parts of digestive tract: Secondary | ICD-10-CM | POA: Insufficient documentation

## 2015-10-08 DIAGNOSIS — E784 Other hyperlipidemia: Secondary | ICD-10-CM | POA: Diagnosis not present

## 2015-10-08 DIAGNOSIS — Z9889 Other specified postprocedural states: Secondary | ICD-10-CM | POA: Insufficient documentation

## 2015-10-08 DIAGNOSIS — Z7902 Long term (current) use of antithrombotics/antiplatelets: Secondary | ICD-10-CM | POA: Insufficient documentation

## 2015-10-08 DIAGNOSIS — I495 Sick sinus syndrome: Secondary | ICD-10-CM | POA: Insufficient documentation

## 2015-10-08 DIAGNOSIS — Z79899 Other long term (current) drug therapy: Secondary | ICD-10-CM | POA: Diagnosis not present

## 2015-10-08 DIAGNOSIS — Z90710 Acquired absence of both cervix and uterus: Secondary | ICD-10-CM | POA: Insufficient documentation

## 2015-10-08 DIAGNOSIS — Z8249 Family history of ischemic heart disease and other diseases of the circulatory system: Secondary | ICD-10-CM | POA: Insufficient documentation

## 2015-10-08 DIAGNOSIS — M792 Neuralgia and neuritis, unspecified: Secondary | ICD-10-CM | POA: Insufficient documentation

## 2015-10-08 DIAGNOSIS — Z888 Allergy status to other drugs, medicaments and biological substances status: Secondary | ICD-10-CM | POA: Diagnosis not present

## 2015-10-08 DIAGNOSIS — Z801 Family history of malignant neoplasm of trachea, bronchus and lung: Secondary | ICD-10-CM | POA: Diagnosis not present

## 2015-10-08 DIAGNOSIS — Z8049 Family history of malignant neoplasm of other genital organs: Secondary | ICD-10-CM | POA: Insufficient documentation

## 2015-10-08 DIAGNOSIS — I10 Essential (primary) hypertension: Secondary | ICD-10-CM | POA: Insufficient documentation

## 2015-10-08 DIAGNOSIS — I38 Endocarditis, valve unspecified: Secondary | ICD-10-CM | POA: Insufficient documentation

## 2015-10-08 DIAGNOSIS — I481 Persistent atrial fibrillation: Secondary | ICD-10-CM | POA: Insufficient documentation

## 2015-10-08 DIAGNOSIS — Z803 Family history of malignant neoplasm of breast: Secondary | ICD-10-CM | POA: Diagnosis not present

## 2015-10-08 DIAGNOSIS — J309 Allergic rhinitis, unspecified: Secondary | ICD-10-CM | POA: Insufficient documentation

## 2015-10-08 HISTORY — PX: ELECTROPHYSIOLOGIC STUDY: SHX172A

## 2015-10-08 SURGERY — CARDIOVERSION (CATH LAB)
Anesthesia: General

## 2015-10-08 MED ORDER — LIDOCAINE HCL (CARDIAC) 20 MG/ML IV SOLN
INTRAVENOUS | Status: DC | PRN
  Administered 2015-10-08: 40 mg via INTRATRACHEAL

## 2015-10-08 MED ORDER — SODIUM CHLORIDE 0.9 % IV SOLN
INTRAVENOUS | Status: DC | PRN
  Administered 2015-10-08: 07:00:00 via INTRAVENOUS

## 2015-10-08 MED ORDER — PROPOFOL 10 MG/ML IV BOLUS
INTRAVENOUS | Status: DC | PRN
  Administered 2015-10-08: 40 mg via INTRAVENOUS

## 2015-10-08 NOTE — CV Procedure (Signed)
Electrical Cardioversion Procedure Note Lisa [name] [id number] 02-06-1929  Procedure: Electrical Cardioversion Indications:  Atrial Fibrillation  Procedure Details Consent: Risks of procedure as well as the alternatives and risks of each were explained to the (patient/caregiver).  Consent for procedure obtained. Time Out: Verified patient identification, verified procedure, site/side was marked, verified correct patient position, special equipment/implants available, medications/allergies/relevent history reviewed, required imaging and test results available.  Performed  Patient placed on cardiac monitor, pulse oximetry, supplemental oxygen as necessary.  Sedation given: Benzodiazepines and Short-acting barbiturates Pacer pads placed anterior and posterior chest.  Cardioverted 1 time(s).  Cardioverted at 120J.  Evaluation Findings: Post procedure EKG shows: NSR Complications: None Patient did tolerate procedure well.   Corey Skains 10/08/2015, 8:00 AM

## 2015-10-08 NOTE — Transfer of Care (Signed)
Immediate Anesthesia Transfer of Care Note  Patient: Lisa [name]  Procedure(s) Performed: Procedure(s): CARDIOVERSION (N/A)  Patient Location: Radiology  Anesthesia Type:General  Level of Consciousness: awake, alert , oriented and patient cooperative  Airway & Oxygen Therapy: Patient Spontanous Breathing and Patient connected to nasal cannula oxygen  Post-op Assessment: Report given to RN, Post -op Vital signs reviewed and stable and Patient moving all extremities X 4  Post vital signs: Reviewed and stable  Last Vitals:  Filed Vitals:   10/08/15 0740 10/08/15 0741  BP:  131/65  Pulse: 54 57  Temp:    Resp: 13 27    Last Pain: There were no vitals filed for this visit.       Complications: No apparent anesthesia complications

## 2015-10-08 NOTE — Discharge Instructions (Signed)
Electrical Cardioversion, Care After °Refer to this sheet in the next few weeks. These instructions provide you with information on caring for yourself after your procedure. Your health care provider may also give you more specific instructions. Your treatment has been planned according to current medical practices, but problems sometimes occur. Call your health care provider if you have any problems or questions after your procedure. °WHAT TO EXPECT AFTER THE PROCEDURE °After your procedure, it is typical to have the following sensations: °· Some redness on the skin where the shocks were delivered. If this is tender, a sunburn lotion or hydrocortisone cream may help. °· Possible return of an abnormal heart rhythm within hours or days after the procedure. °HOME CARE INSTRUCTIONS °· Take medicines only as directed by your health care provider. Be sure you understand how and when to take your medicine. °· Learn how to feel your pulse and check it often. °· Limit your activity for 48 hours after the procedure or as directed by your health care provider. °· Avoid or minimize caffeine and other stimulants as directed by your health care provider. °SEEK MEDICAL CARE IF: °· You feel like your heart is beating too fast or your pulse is not regular. °· You have any questions about your medicines. °· You have bleeding that will not stop. °SEEK IMMEDIATE MEDICAL CARE IF: °· You are dizzy or feel faint. °· It is hard to breathe or you feel short of breath. °· There is a change in discomfort in your chest. °· Your speech is slurred or you have trouble moving an arm or leg on one side of your body. °· You get a serious muscle cramp that does not go away. °· Your fingers or toes turn cold or blue. °  °This information is not intended to replace advice given to you by your health care provider. Make sure you discuss any questions you have with your health care provider. °  °Document Released: 01/02/2013 Document Revised: 04/04/2014  Document Reviewed: 01/02/2013 °Elsevier Interactive Patient Education ©2016 [employer] ° °

## 2015-10-08 NOTE — Anesthesia Preprocedure Evaluation (Signed)
Anesthesia Evaluation   Patient awake    Reviewed: Allergy & Precautions, NPO status , Patient's Chart, lab work & pertinent test results  Airway Mallampati: II       Dental  (+) Implants   Pulmonary neg pulmonary ROS,    Pulmonary exam normal        Cardiovascular hypertension, Pt. on medications + dysrhythmias Atrial Fibrillation  Rhythm:Regular     Neuro/Psych negative neurological ROS     GI/Hepatic negative GI ROS, Neg liver ROS,   Endo/Other  negative endocrine ROS  Renal/GU negative Renal ROS     Musculoskeletal   Abdominal Normal abdominal exam  (+)   Peds  Hematology negative hematology ROS (+) anemia ,   Anesthesia Other Findings   Reproductive/Obstetrics                             Anesthesia Physical Anesthesia Plan  ASA: III  Anesthesia Plan: General   Post-op Pain Management:    Induction: Intravenous  Airway Management Planned: Natural Airway and Nasal Cannula  Additional Equipment:   Intra-op Plan:   Post-operative Plan:   Informed Consent: I have reviewed the patients History and Physical, chart, labs and discussed the procedure including the risks, benefits and alternatives for the proposed anesthesia with the patient or authorized representative who has indicated his/her understanding and acceptance.     Plan Discussed with: CRNA  Anesthesia Plan Comments:         Anesthesia Quick Evaluation

## 2015-10-08 NOTE — Anesthesia Postprocedure Evaluation (Signed)
Anesthesia Post Note  Patient: Lisa [name]  Procedure(s) Performed: Procedure(s) (LRB): CARDIOVERSION (N/A)  Patient location during evaluation: PACU Anesthesia Type: General Level of consciousness: awake Pain management: satisfactory to patient Vital Signs Assessment: post-procedure vital signs reviewed and stable Respiratory status: nonlabored ventilation Cardiovascular status: stable Anesthetic complications: no    Last Vitals:  Filed Vitals:   10/08/15 0741 10/08/15 0745  BP: 131/65 127/63  Pulse: 57 56  Temp:    Resp: 27 13    Last Pain: There were no vitals filed for this visit.               VAN STAVEREN,Akiel Fennell

## 2015-11-12 ENCOUNTER — Ambulatory Visit: Payer: Medicare Other | Admitting: Obstetrics and Gynecology

## 2015-11-19 ENCOUNTER — Ambulatory Visit: Payer: Medicare Other | Admitting: Obstetrics and Gynecology

## 2015-12-10 ENCOUNTER — Encounter: Payer: Self-pay | Admitting: Obstetrics and Gynecology

## 2015-12-10 ENCOUNTER — Ambulatory Visit (INDEPENDENT_AMBULATORY_CARE_PROVIDER_SITE_OTHER): Payer: Medicare Other | Admitting: Obstetrics and Gynecology

## 2015-12-10 VITALS — BP 146/83 | HR 110 | Ht 62.5 in | Wt 140.1 lb

## 2015-12-10 DIAGNOSIS — N8110 Cystocele, unspecified: Secondary | ICD-10-CM

## 2015-12-10 DIAGNOSIS — N815 Vaginal enterocele: Secondary | ICD-10-CM

## 2015-12-10 DIAGNOSIS — Z4689 Encounter for fitting and adjustment of other specified devices: Secondary | ICD-10-CM | POA: Diagnosis not present

## 2015-12-10 DIAGNOSIS — IMO0002 Reserved for concepts with insufficient information to code with codable children: Secondary | ICD-10-CM

## 2015-12-10 NOTE — Patient Instructions (Signed)
1. Return in 3 months for pessary maintenance 2. Continue using Trimosan gel 2-3 times a week

## 2015-12-10 NOTE — Progress Notes (Signed)
Pt presents for 21mo pessary check.  Chief complaint: (Last visit 08/13/2015) 1. Pessary check  2. History of incomplete bladder emptying 3. History of cystocele, vaginal enterocele  3 month pessary check-2 and three-quarter inch Gellhorn pessary No vb, pain, d/c. Patient reports occasional slippage of bladder  around pessary ; she is dealing with this.  Patient is to have cardiac ablation for cardiac arrhythmia (A. fib) in 2 weeks  Past medical history, past surgical history, problem list, medications, and allergies are reviewed. Status postTAH/BSO   Review of systems: Per history of present illness   OBJECTIVE : BP (!) 146/83 (BP Location: Left Arm, Patient Position: Sitting, Cuff Size: Normal)   Pulse (!) 110   Ht 5' 2.5" (1.588 m)   Wt 140 lb 1.6 oz (63.5 kg)   BMI 25.22 kg/m  Pleasant, well-appearing white female in no acute distress who appears younger than stated age. Abdomen soft, nontender. Pelvic exam: External genitalia-atrophic changes. BUS-Urethral caruncle present Vagina-vaginal mucosa appears healthy without evidence of ulceration or lesion; minimal white discharge noted; no odor. Cervix-surgically absent. Uterus-surgically absent. Rectovaginal-external exam normal.  Procedure: The gel horn pessary is removed, cleaned, and reinserted.  ASSESSMENT: 1. Pelvic organ prolapse, stable with pessary use. 2. Cystocele. 3. Enterocele. 4. Normal pessary check.-3 months.   PLAN: 1. Continue Trimosan gel weekly  2. Return in 3 months for follow-up pessary maintenance  3. Cardiac ablation in 2 weeks  A total of 15 minutes were spent face-to-face with the patient during this encounter and over half of that time dealt with counseling and coordination of care.  Brayton Mars, MD  Note: This dictation was prepared with Dragon dictation along with smaller phrase technology. Any transcriptional errors that result from this process are  unintentional.

## 2016-03-10 ENCOUNTER — Ambulatory Visit: Payer: Medicare Other | Admitting: Obstetrics and Gynecology

## 2016-04-12 ENCOUNTER — Ambulatory Visit: Payer: Medicare Other | Admitting: Obstetrics and Gynecology

## 2016-04-28 ENCOUNTER — Encounter: Payer: Self-pay | Admitting: Obstetrics and Gynecology

## 2016-04-28 ENCOUNTER — Ambulatory Visit (INDEPENDENT_AMBULATORY_CARE_PROVIDER_SITE_OTHER): Payer: Medicare Other | Admitting: Obstetrics and Gynecology

## 2016-04-28 VITALS — BP 144/67 | HR 69 | Ht 62.5 in | Wt 139.9 lb

## 2016-04-28 DIAGNOSIS — R339 Retention of urine, unspecified: Secondary | ICD-10-CM | POA: Insufficient documentation

## 2016-04-28 DIAGNOSIS — N815 Vaginal enterocele: Secondary | ICD-10-CM

## 2016-04-28 DIAGNOSIS — Z4689 Encounter for fitting and adjustment of other specified devices: Secondary | ICD-10-CM | POA: Diagnosis not present

## 2016-04-28 DIAGNOSIS — N8111 Cystocele, midline: Secondary | ICD-10-CM | POA: Diagnosis not present

## 2016-04-28 NOTE — Patient Instructions (Signed)
1. Return in 3 months for pessary maintenance 2. Continue using trauma San gel intravaginally once or twice a week

## 2016-04-28 NOTE — Progress Notes (Signed)
Pt presents for 34mo pessary check.  Chief complaint: (Last visit 12/10/2015) 1. Pessary check  2. History of incomplete bladder emptying 3. History of cystocele, vaginal enterocele  4 month pessary check-2 and three-quarter inch Gellhorn pessary (pessary evaluation was delayed 4 weeks) No vb, pain, d/c. Patient reports occasional slippage of bladder around pessary ; she is dealing with this. Over the past several weeks she has noted increased vaginal odor  She is status post cardiac ablation approximately 3 months ago and is doing well  Past medical history, past surgical history, problem list, medications, and allergies are reviewed. Status postTAH/BSO   Review of systems: Per history of present illness  OBJECTIVE : BP (!) 144/67   Pulse 69   Ht 5' 2.5" (1.588 m)   Wt 139 lb 14.4 oz (63.5 kg)   BMI 25.18 kg/m  Pleasant, well-appearing white female in no acute distress who appears younger than stated age. Abdomen soft, nontender. Pelvic exam: External genitalia-atrophic changes. BUS-Urethral caruncle present Vagina-vaginal mucosa appears healthy without evidence of ulceration or lesion; minimal white discharge noted; no odor. Cervix-surgically absent. Uterus-surgically absent. Rectovaginal-external exam normal.  Procedure: The gel horn pessary is removed, cleaned, and reinserted.  ASSESSMENT: 1. Pelvic organ prolapse, stable with pessary use. 2. Cystocele. 3. Enterocele. 4. Normal pessary check.-4 months.  PLAN: 1. Pessary is removed, cleaned, and reinserted. 2. Continue with primary Trimo San gel weekly or twice weekly 3. Return in 3 months for pessary maintenance  A total of 15 minutes were spent face-to-face with the patient during this encounter and over half of that time dealt with counseling and coordination of care.  Brayton Mars, MD  Note: This dictation was prepared with Dragon dictation along with smaller phrase technology. Any  transcriptional errors that result from this process are unintentional.

## 2016-07-26 ENCOUNTER — Encounter: Payer: Self-pay | Admitting: Obstetrics and Gynecology

## 2016-07-26 ENCOUNTER — Ambulatory Visit (INDEPENDENT_AMBULATORY_CARE_PROVIDER_SITE_OTHER): Payer: Medicare Other | Admitting: Obstetrics and Gynecology

## 2016-07-26 VITALS — BP 148/69 | HR 72 | Ht 62.5 in | Wt 133.9 lb

## 2016-07-26 DIAGNOSIS — Z4689 Encounter for fitting and adjustment of other specified devices: Secondary | ICD-10-CM

## 2016-07-26 DIAGNOSIS — N8111 Cystocele, midline: Secondary | ICD-10-CM | POA: Diagnosis not present

## 2016-07-26 DIAGNOSIS — K469 Unspecified abdominal hernia without obstruction or gangrene: Secondary | ICD-10-CM

## 2016-07-26 DIAGNOSIS — R339 Retention of urine, unspecified: Secondary | ICD-10-CM | POA: Diagnosis not present

## 2016-07-26 NOTE — Patient Instructions (Signed)
1. Return in 3 months for pessary maintenance 2. Continue to use Trimosan gel intravaginal 2 or 3 times equally 3. At next visit, pessary may be left in situ if no symptoms are noted

## 2016-07-26 NOTE — Progress Notes (Signed)
Pt presents for 3 mo pessary check.  Chief complaint: (Last visit 04/28/2016) 1. Pessary check  2. History of incomplete bladder emptying 3. History of cystocele, vaginal enterocele  4 month pessary check-2 and three-quarter inch Gellhorn pessary (pessary evaluation was delayed 4 weeks) No vb, pain, d/c. Patient reports occasional slippage of bladder around pessary ; she is dealing with this. Over the past several weeks she has noted increased vaginal odor  She is status post cardiac ablation approximately 3 months ago and is doing well  Past medical history, past surgical history, problem list, medications, and allergies are reviewed. Status postTAH/BSO   Review of systems: Per history of present illness  OBJECTIVE : BP (!) 148/69   Pulse 72   Ht 5' 2.5" (1.588 m)   Wt 133 lb 14.4 oz (60.7 kg)   BMI 24.10 kg/m  Pleasant, well-appearing white female in no acute distress who appears younger than stated age. Abdomen soft, nontender. Pelvic exam: External genitalia-atrophic changes. BUS-Urethral caruncle present Vagina-vaginal mucosa appears healthy without evidence of ulceration or lesion; no discharge; no odor. Cervix-surgically absent. Uterus-surgically absent. Rectovaginal-external exam normal.  Procedure: The gel horn pessary is removed, cleaned, and reinserted.  ASSESSMENT: 1. Pelvic organ prolapse, stable with pessary use. 2. Cystocele. 3. Enterocele. 4. Normal pessary check.-3 months.  PLAN: 1. Pessary is removed, cleaned, and reinserted. 2. Continue with primary Trimo San gel weekly or twice weekly 3. Return in 3 months for pessary maintenance; may keep pessary in situ if asymptomatic  A total of 15 minutes were spent face-to-face with the patient during this encounter and over half of that time dealt with counseling and coordination of care.  Brayton Mars, MD  Note: This dictation was prepared with Dragon dictation along with  smaller phrase technology. Any transcriptional errors that result from this process are unintentional.

## 2016-08-10 ENCOUNTER — Other Ambulatory Visit: Payer: Self-pay | Admitting: Physician Assistant

## 2016-08-10 DIAGNOSIS — Z1231 Encounter for screening mammogram for malignant neoplasm of breast: Secondary | ICD-10-CM

## 2016-09-12 ENCOUNTER — Ambulatory Visit
Admission: RE | Admit: 2016-09-12 | Discharge: 2016-09-12 | Disposition: A | Discharge status: Home / Self Care | Payer: Medicare Other | Source: Ambulatory Visit | Attending: Physician Assistant | Admitting: Physician Assistant

## 2016-09-12 DIAGNOSIS — Z1231 Encounter for screening mammogram for malignant neoplasm of breast: Secondary | ICD-10-CM | POA: Diagnosis not present

## 2016-09-30 ENCOUNTER — Other Ambulatory Visit
Admission: RE | Admit: 2016-09-30 | Discharge: 2016-09-30 | Disposition: A | Discharge status: Home / Self Care | Payer: Medicare Other | Source: Ambulatory Visit | Attending: Nurse Practitioner | Admitting: Nurse Practitioner

## 2016-09-30 ENCOUNTER — Other Ambulatory Visit: Payer: Self-pay | Admitting: Nurse Practitioner

## 2016-09-30 DIAGNOSIS — R5383 Other fatigue: Secondary | ICD-10-CM

## 2016-09-30 DIAGNOSIS — R194 Change in bowel habit: Secondary | ICD-10-CM

## 2016-09-30 DIAGNOSIS — R634 Abnormal weight loss: Secondary | ICD-10-CM

## 2016-09-30 HISTORY — DX: Abnormal weight loss: R63.4

## 2016-09-30 HISTORY — DX: Other fatigue: R53.83

## 2016-09-30 HISTORY — DX: Change in bowel habit: R19.4

## 2016-10-06 ENCOUNTER — Ambulatory Visit: Payer: Medicare Other

## 2016-10-10 ENCOUNTER — Other Ambulatory Visit
Admission: RE | Admit: 2016-10-10 | Discharge: 2016-10-10 | Disposition: A | Discharge status: Home / Self Care | Payer: Medicare Other | Source: Ambulatory Visit | Attending: Nurse Practitioner | Admitting: Nurse Practitioner

## 2016-10-10 DIAGNOSIS — R197 Diarrhea, unspecified: Secondary | ICD-10-CM | POA: Diagnosis present

## 2016-10-10 LAB — GASTROINTESTINAL PANEL BY PCR, STOOL (REPLACES STOOL CULTURE)

## 2016-10-10 LAB — C DIFFICILE QUICK SCREEN W PCR REFLEX
C Diff antigen: NEGATIVE
C Diff interpretation: NOT DETECTED
C Diff toxin: NEGATIVE

## 2016-10-11 ENCOUNTER — Ambulatory Visit
Admission: RE | Admit: 2016-10-11 | Discharge: 2016-10-11 | Disposition: A | Discharge status: Home / Self Care | Payer: Medicare Other | Source: Ambulatory Visit | Attending: Nurse Practitioner | Admitting: Nurse Practitioner

## 2016-10-11 DIAGNOSIS — R634 Abnormal weight loss: Secondary | ICD-10-CM | POA: Insufficient documentation

## 2016-10-11 DIAGNOSIS — K5730 Diverticulosis of large intestine without perforation or abscess without bleeding: Secondary | ICD-10-CM | POA: Diagnosis not present

## 2016-10-11 DIAGNOSIS — I700 Atherosclerosis of aorta: Secondary | ICD-10-CM | POA: Insufficient documentation

## 2016-10-11 MED ORDER — IOPAMIDOL (ISOVUE-300) INJECTION 61%
100.0000 mL | Freq: Once | INTRAVENOUS | Status: AC | PRN
  Administered 2016-10-11: 100 mL via INTRAVENOUS

## 2016-10-11 MED ORDER — IOPAMIDOL (ISOVUE-M 300) INJECTION 61%: 15.0000 mL | Freq: Once | INTRAMUSCULAR | Status: DC | PRN

## 2016-11-01 ENCOUNTER — Encounter: Payer: Medicare Other | Admitting: Obstetrics and Gynecology

## 2016-11-05 ENCOUNTER — Encounter: Payer: Self-pay | Admitting: *Deleted

## 2016-11-05 ENCOUNTER — Observation Stay
Admission: EM | Admit: 2016-11-05 | Discharge: 2016-11-09 | Disposition: A | Discharge status: Home / Self Care | Payer: Medicare Other | Attending: Internal Medicine | Admitting: Internal Medicine

## 2016-11-05 ENCOUNTER — Emergency Department: Payer: Medicare Other

## 2016-11-05 DIAGNOSIS — M5126 Other intervertebral disc displacement, lumbar region: Secondary | ICD-10-CM | POA: Insufficient documentation

## 2016-11-05 DIAGNOSIS — E785 Hyperlipidemia, unspecified: Secondary | ICD-10-CM | POA: Diagnosis not present

## 2016-11-05 DIAGNOSIS — I10 Essential (primary) hypertension: Secondary | ICD-10-CM | POA: Diagnosis not present

## 2016-11-05 DIAGNOSIS — Z7902 Long term (current) use of antithrombotics/antiplatelets: Secondary | ICD-10-CM | POA: Diagnosis not present

## 2016-11-05 DIAGNOSIS — W010XXA Fall on same level from slipping, tripping and stumbling without subsequent striking against object, initial encounter: Secondary | ICD-10-CM | POA: Diagnosis not present

## 2016-11-05 DIAGNOSIS — I700 Atherosclerosis of aorta: Secondary | ICD-10-CM | POA: Insufficient documentation

## 2016-11-05 DIAGNOSIS — Z955 Presence of coronary angioplasty implant and graft: Secondary | ICD-10-CM | POA: Insufficient documentation

## 2016-11-05 DIAGNOSIS — S32010A Wedge compression fracture of first lumbar vertebra, initial encounter for closed fracture: Secondary | ICD-10-CM | POA: Diagnosis present

## 2016-11-05 DIAGNOSIS — Z419 Encounter for procedure for purposes other than remedying health state, unspecified: Secondary | ICD-10-CM

## 2016-11-05 DIAGNOSIS — R52 Pain, unspecified: Secondary | ICD-10-CM | POA: Diagnosis present

## 2016-11-05 DIAGNOSIS — M545 Low back pain: Secondary | ICD-10-CM

## 2016-11-05 DIAGNOSIS — D72829 Elevated white blood cell count, unspecified: Secondary | ICD-10-CM | POA: Diagnosis not present

## 2016-11-05 DIAGNOSIS — M48061 Spinal stenosis, lumbar region without neurogenic claudication: Secondary | ICD-10-CM | POA: Insufficient documentation

## 2016-11-05 DIAGNOSIS — R0602 Shortness of breath: Secondary | ICD-10-CM

## 2016-11-05 DIAGNOSIS — M5459 Other low back pain: Secondary | ICD-10-CM

## 2016-11-05 DIAGNOSIS — Z85828 Personal history of other malignant neoplasm of skin: Secondary | ICD-10-CM | POA: Insufficient documentation

## 2016-11-05 DIAGNOSIS — Z79899 Other long term (current) drug therapy: Secondary | ICD-10-CM | POA: Diagnosis not present

## 2016-11-05 DIAGNOSIS — N281 Cyst of kidney, acquired: Secondary | ICD-10-CM | POA: Insufficient documentation

## 2016-11-05 DIAGNOSIS — I4891 Unspecified atrial fibrillation: Secondary | ICD-10-CM | POA: Insufficient documentation

## 2016-11-05 LAB — URINALYSIS, COMPLETE (UACMP) WITH MICROSCOPIC
BACTERIA UA: NONE SEEN
Bilirubin Urine: NEGATIVE
Glucose, UA: NEGATIVE mg/dL
Hgb urine dipstick: NEGATIVE
KETONES UR: 20 mg/dL — AB
Nitrite: NEGATIVE
PH: 5 (ref 5.0–8.0)
Protein, ur: 100 mg/dL — AB
SQUAMOUS EPITHELIAL / LPF: NONE SEEN
Specific Gravity, Urine: 1.032 — ABNORMAL HIGH (ref 1.005–1.030)

## 2016-11-05 LAB — CBC WITH DIFFERENTIAL/PLATELET
Basophils Absolute: 0.1 10*3/uL (ref 0–0.1)
Basophils Relative: 1 %
EOS PCT: 0 %
Eosinophils Absolute: 0 10*3/uL (ref 0–0.7)
HCT: 41.3 % (ref 35.0–47.0)
Hemoglobin: 13.7 g/dL (ref 12.0–16.0)
LYMPHS ABS: 1.1 10*3/uL (ref 1.0–3.6)
LYMPHS PCT: 8 %
MCH: 30.2 pg (ref 26.0–34.0)
MCHC: 33.2 g/dL (ref 32.0–36.0)
MCV: 90.8 fL (ref 80.0–100.0)
MONO ABS: 0.9 10*3/uL (ref 0.2–0.9)
Monocytes Relative: 7 %
Neutro Abs: 11.2 10*3/uL — ABNORMAL HIGH (ref 1.4–6.5)
Neutrophils Relative %: 84 %
PLATELETS: 180 10*3/uL (ref 150–440)
RBC: 4.55 MIL/uL (ref 3.80–5.20)
RDW: 14.6 % — AB (ref 11.5–14.5)
WBC: 13.4 10*3/uL — ABNORMAL HIGH (ref 3.6–11.0)

## 2016-11-05 LAB — COMPREHENSIVE METABOLIC PANEL
ALT: 23 U/L (ref 14–54)
AST: 26 U/L (ref 15–41)
Albumin: 3.6 g/dL (ref 3.5–5.0)
Alkaline Phosphatase: 79 U/L (ref 38–126)
Anion gap: 8 (ref 5–15)
BUN: 27 mg/dL — AB (ref 6–20)
CHLORIDE: 105 mmol/L (ref 101–111)
CO2: 26 mmol/L (ref 22–32)
Calcium: 9 mg/dL (ref 8.9–10.3)
Creatinine, Ser: 0.71 mg/dL (ref 0.44–1.00)
GFR calc Af Amer: >60 mL/min (ref >60)
Glucose, Bld: 126 mg/dL — ABNORMAL HIGH (ref 65–99)
Potassium: 3.5 mmol/L (ref 3.5–5.1)
Sodium: 139 mmol/L (ref 135–145)
Total Bilirubin: 0.8 mg/dL (ref 0.3–1.2)
Total Protein: 6.4 g/dL — ABNORMAL LOW (ref 6.5–8.1)

## 2016-11-05 MED ORDER — MORPHINE SULFATE (PF) 2 MG/ML IV SOLN
2.0000 mg | Freq: Once | INTRAVENOUS | Status: AC
  Administered 2016-11-05: 2 mg via INTRAVENOUS

## 2016-11-05 MED ORDER — ONDANSETRON HCL 4 MG/2ML IJ SOLN
4.0000 mg | Freq: Once | INTRAMUSCULAR | Status: AC
  Administered 2016-11-05: 4 mg via INTRAVENOUS

## 2016-11-05 MED ORDER — SODIUM CHLORIDE 0.9 % IV SOLN
1000.0000 mL | Freq: Once | INTRAVENOUS | Status: AC
  Administered 2016-11-05: 1000 mL via INTRAVENOUS

## 2016-11-05 MED ORDER — MORPHINE SULFATE (PF) 2 MG/ML IV SOLN
INTRAVENOUS | Status: AC
  Filled 2016-11-05: qty 1

## 2016-11-05 MED ORDER — ONDANSETRON HCL 4 MG/2ML IJ SOLN
INTRAMUSCULAR | Status: AC
  Filled 2016-11-05: qty 2

## 2016-11-05 NOTE — ED Notes (Signed)
Patient taken to CT scan.

## 2016-11-05 NOTE — ED Notes (Signed)
Volume obtained on bladder scan was 97ml. Dr. Corky Downs aware.

## 2016-11-05 NOTE — ED Provider Notes (Signed)
[facility] Emergency Department Provider Note   ____________________________________________    I have reviewed the triage vital signs and the nursing notes.   HISTORY  Chief Complaint Urinary Retention     HPI Lisa [name] is a 83 y.o. female Who presents with complaints of difficulty urinating. Patient reports yesterday she started having difficulty urinating and was only able to urinate a small amount.Todayshe has had an even harder time. She reports she fell 4 days ago onto her back and has had some lower back pain. No numbness tingling or weakness in her lower legs. She does have a pessary for a prolapsed bladder. She denies fevers or chills. No nausea or vomiting. No abdominal pain. She is on eliquis for atrial fibrillation. She reports she had urinary retention once many many years ago because of diphenhydramine   Past Medical History:  Diagnosis Date  . A-fib ([place])   . Basal cell carcinoma   . Constipation   . Cystocele   . Hyperlipemia   . Hypertension   . Incomplete bladder emptying   . Menopausal state   . Urethral caruncle   . Urinary urgency     Patient Active Problem List   Diagnosis Date Noted  . Cystocele, midline 04/28/2016  . Incomplete bladder emptying 04/28/2016  . Cystocele 02/12/2015  . Vaginal enterocele 02/12/2015  . Absolute anemia 11/13/2014  . Benign essential HTN 11/13/2014  . APC (atrial premature contractions) 11/13/2014  . Awareness of heartbeats 11/13/2014  . Beat, premature ventricular 11/13/2014  . Arrhythmia, sinus node 11/13/2014  . Combined fat and carbohydrate induced hyperlipemia 04/23/2014  . MI (mitral incompetence) 04/23/2014  . AF (paroxysmal atrial fibrillation) ([place]) 04/23/2014  . Basal cell carcinoma of face 09/18/2012    Past Surgical History:  Procedure Laterality Date  . ABDOMINAL HYSTERECTOMY  1975   and bso  . APPENDECTOMY    . BASAL CELL CARCINOMA EXCISION    . BREAST  CYST ASPIRATION Left   . CARDIOVERSION    . CATARACT EXTRACTION    . CHOLECYSTECTOMY    . ELECTROPHYSIOLOGIC STUDY N/A 09/02/2015   Procedure: Cardioversion;  Surgeon: Corey Skains, MD;  Location: [facility] ORS;  Service: Cardiovascular;  Laterality: N/A;  . ELECTROPHYSIOLOGIC STUDY N/A 10/08/2015   Procedure: CARDIOVERSION;  Surgeon: Corey Skains, MD;  Location: [facility] ORS;  Service: Cardiovascular;  Laterality: N/A;    Prior to Admission medications   Medication Sig Start Date End Date Taking? Authorizing Provider  amiodarone (PACERONE) 400 MG tablet Take 200 mg by mouth daily.     [provider]  amLODipine (NORVASC) 5 MG tablet Take 10 mg by mouth.  04/18/16 04/18/17  [provider]  apixaban (ELIQUIS) 5 MG TABS tablet Take 5 mg by mouth 2 (two) times daily.  03/16/15   [provider]  calcium carbonate (TUMS) 500 MG chewable tablet Chew by mouth.    [provider]  Cholecalciferol (VITAMIN D3) 1000 UNITS CAPS Take 1 capsule by mouth daily.     [provider]  cyanocobalamin ([facility] VITAMIN B12) 2000 MCG tablet Take 2,000 mcg by mouth daily.     [provider]  diltiazem (CARDIZEM CD) 180 MG 24 hr capsule Take 1 capsule by mouth 2 (two) times daily. 09/19/15   [provider]  Lactobacillus ([facility] PROBIOTIC ACIDOPHILUS) 10 MG CAPS Take by mouth.    [provider]  losartan (COZAAR) 25 MG tablet Take by mouth. 05/17/16 05/17/17  [provider]  magnesium oxide (MAG-OX) 400 MG tablet Take 400 mg by mouth daily.     [provider]  Multiple Vitamin (MULTI-VITAMINS) TABS Take by mouth.    [provider]  OXYQUINOLONE SULFATE VAGINAL (TRIMO-SAN) 0.025 % GEL Place vaginally.    [provider]  Psyllium (METAMUCIL FIBER PO) Take 1 packet by mouth daily.    [provider]  pyridoxine (B-6) 100 MG tablet Take 100 mg by mouth daily.    [provider]  rosuvastatin  (CRESTOR) 5 MG tablet Take 5 mg by mouth daily at 6 PM.  01/14/15   [provider]     Allergies Ace inhibitors; Benadryl [diphenhydramine hcl (sleep)]; Diphenhydramine-acetaminophen; and Tylenol pm extra [diphenhydramine-apap (sleep)]  Family History  Problem Relation Age of Onset  . Heart failure Father   . Breast cancer Sister 73  . Ovarian cancer Sister   . Heart failure Brother   . Colon cancer Neg Hx     Social History Social History  Substance Use Topics  . Smoking status: Never Smoker  . Smokeless tobacco: Never Used  . Alcohol use No    Review of Systems  Constitutional: No fever/chills Eyes: No visual changes.  ENT: no neck pain Cardiovascular: Denies chest pain. Respiratory: Denies shortness of breath. Gastrointestinal: No abdominal pain.  No nausea, no vomiting.   Genitourinary: as above. Musculoskeletal: low back pain as above Skin: Negative for rash. Neurological: Negative for numbness or weakness   ____________________________________________   PHYSICAL EXAM:  VITAL SIGNS: ED Triage Vitals  Enc Vitals Group     BP 11/05/16 2047 (!) 211/92     Pulse Rate 11/05/16 2047 92     Resp 11/05/16 2047 18     Temp 11/05/16 2047 98 F (36.7 C)     Temp Source 11/05/16 2047 Oral     SpO2 11/05/16 2047 96 %     Weight 11/05/16 2048 59 kg (130 lb)     Height 11/05/16 2048 1.6 m (5\' 3" )     Head Circumference --      Peak Flow --      Pain Score 11/05/16 2039 3     Pain Loc --      Pain Edu? --      Excl. in [place]? --     Constitutional: Alert and oriented. No acute distress. Pleasant and interactive Eyes: Conjunctivae are normal.  Head: Atraumatic. Nose: No congestion/rhinnorhea. Mouth/Throat: Mucous membranes are moist.   Neck:  Painless ROM Cardiovascular: Normal rate, regular rhythm. Kermit Balo peripheral circulation. Respiratory: Normal respiratory effort.  No retractions.  Gastrointestinal: Soft and nontender. No distention.     Genitourinary: pessary is in place Musculoskeletal: Normal strength in the lower extremities.no numbness or tingling. Warm and well perfused Neurologic:  Normal speech and language. No gross focal neurologic deficits are appreciated.  Skin:  Skin is warm, dry and intact. No rash noted. Psychiatric: Mood and affect are normal.   ____________________________________________   LABS (all labs ordered are listed, but only abnormal results are displayed)  Labs Reviewed  COMPREHENSIVE METABOLIC PANEL - Abnormal; Notable for the following:       Result Value   Glucose, Bld 126 (*)    BUN 27 (*)    Total Protein 6.4 (*)    All other components within normal limits  CBC WITH DIFFERENTIAL/PLATELET - Abnormal; Notable for the following:    WBC 13.4 (*)    RDW 14.6 (*)  Neutro Abs 11.2 (*)    All other components within normal limits  URINALYSIS, COMPLETE (UACMP) WITH MICROSCOPIC - Abnormal; Notable for the following:    Color, Urine AMBER (*)    APPearance HAZY (*)    Specific Gravity, Urine 1.032 (*)    Ketones, ur 20 (*)    Protein, ur 100 (*)    Leukocytes, UA TRACE (*)    All other components within normal limits   ____________________________________________  EKG  None ____________________________________________  RADIOLOGY  CT lumbar spine ____________________________________________   PROCEDURES  Procedure(s) performed: No    Critical Care performed:No ____________________________________________   INITIAL IMPRESSION / ASSESSMENT AND PLAN / ED COURSE  Pertinent labs & imaging results that were available during my care of the patient were reviewed by me and considered in my medical decision making (see chart for details).  Patient presents with complaints of urinary retention. She reports fullness in her bladder and only small amounts. Bladder scan does not show significant urine however Foley placed with only about 20 or 30 cc out. Kidney function is  unremarkable, patient is somewhat dehydrated, IV fluids started. Urinalysis pending  Discussed with [facility] neurosurgery. They feel cauda equina syndrome is unlikely without perineal numbness or llower extremity weakness especially given no retention. I will obtain MRI of the lumbar spine to further evaluate.   ____________________________________________   FINAL CLINICAL IMPRESSION(S) / ED DIAGNOSES  Final diagnoses:  Closed compression fracture of first lumbar vertebra, initial encounter ([place])      NEW MEDICATIONS STARTED DURING THIS VISIT:  New Prescriptions   No medications on file     Note:  This document was prepared using Dragon voice recognition software and may include unintentional dictation errors.    Lavonia Drafts, MD 11/05/16 [phone]

## 2016-11-05 NOTE — ED Triage Notes (Signed)
Per EMS report, patient c/o urinary retention today and some of yesterday. Patient is s/p fall for 6 days and c/o lumbar back pain, but denies numbness or weakness to legs.

## 2016-11-06 ENCOUNTER — Observation Stay: Payer: Medicare Other

## 2016-11-06 DIAGNOSIS — R52 Pain, unspecified: Secondary | ICD-10-CM | POA: Diagnosis present

## 2016-11-06 LAB — TSH: TSH: 2.124 u[IU]/mL (ref 0.350–4.500)

## 2016-11-06 MED ORDER — MORPHINE SULFATE (PF) 2 MG/ML IV SOLN
INTRAVENOUS | Status: AC
  Filled 2016-11-06: qty 1

## 2016-11-06 MED ORDER — DOCUSATE SODIUM 100 MG PO CAPS
100.0000 mg | ORAL_CAPSULE | Freq: Two times a day (BID) | ORAL | Status: DC
  Administered 2016-11-06 – 2016-11-09 (×5): 100 mg via ORAL
  Filled 2016-11-06 (×6): qty 1

## 2016-11-06 MED ORDER — ACETAMINOPHEN 650 MG RE SUPP: 650.0000 mg | Freq: Four times a day (QID) | RECTAL | Status: DC | PRN

## 2016-11-06 MED ORDER — LABETALOL HCL 5 MG/ML IV SOLN
5.0000 mg | INTRAVENOUS | Status: DC | PRN
  Administered 2016-11-07 – 2016-11-08 (×2): 5 mg via INTRAVENOUS
  Filled 2016-11-06 (×2): qty 4

## 2016-11-06 MED ORDER — ACETAMINOPHEN 325 MG PO TABS
650.0000 mg | ORAL_TABLET | Freq: Four times a day (QID) | ORAL | Status: DC | PRN
  Administered 2016-11-08 – 2016-11-09 (×2): 650 mg via ORAL
  Filled 2016-11-06 (×3): qty 2

## 2016-11-06 MED ORDER — ONDANSETRON HCL 4 MG/2ML IJ SOLN: 4.0000 mg | Freq: Four times a day (QID) | INTRAMUSCULAR | Status: DC | PRN

## 2016-11-06 MED ORDER — APIXABAN 2.5 MG PO TABS: 2.5000 mg | ORAL_TABLET | Freq: Two times a day (BID) | ORAL | Status: DC

## 2016-11-06 MED ORDER — MORPHINE SULFATE (PF) 2 MG/ML IV SOLN
2.0000 mg | INTRAVENOUS | Status: DC | PRN
  Administered 2016-11-06: 2 mg via INTRAVENOUS

## 2016-11-06 MED ORDER — LOSARTAN POTASSIUM 50 MG PO TABS
50.0000 mg | ORAL_TABLET | Freq: Every day | ORAL | Status: DC
  Administered 2016-11-06 – 2016-11-09 (×3): 50 mg via ORAL
  Filled 2016-11-06 (×3): qty 1

## 2016-11-06 MED ORDER — SODIUM CHLORIDE 0.9 % IV SOLN
INTRAVENOUS | Status: DC
Start: 2016-11-06 — End: 2016-11-06
  Administered 2016-11-06: 04:00:00 via INTRAVENOUS

## 2016-11-06 MED ORDER — MAGNESIUM HYDROXIDE 400 MG/5ML PO SUSP
30.0000 mL | Freq: Every day | ORAL | Status: DC | PRN
Start: 2016-11-06 — End: 2016-11-09

## 2016-11-06 MED ORDER — TRAMADOL HCL 50 MG PO TABS
100.0000 mg | ORAL_TABLET | Freq: Four times a day (QID) | ORAL | Status: DC | PRN
  Administered 2016-11-06 – 2016-11-07 (×2): 100 mg via ORAL
  Filled 2016-11-06 (×2): qty 2

## 2016-11-06 MED ORDER — VITAMIN B-12 1000 MCG PO TABS
1000.0000 ug | ORAL_TABLET | Freq: Every day | ORAL | Status: DC
  Administered 2016-11-06 – 2016-11-09 (×3): 1000 ug via ORAL
  Filled 2016-11-06 (×3): qty 1

## 2016-11-06 MED ORDER — VITAMIN D 1000 UNITS PO TABS
1000.0000 [IU] | ORAL_TABLET | Freq: Every day | ORAL | Status: DC
  Administered 2016-11-06 – 2016-11-09 (×3): 1000 [IU] via ORAL
  Filled 2016-11-06 (×3): qty 1

## 2016-11-06 MED ORDER — HEPARIN SODIUM (PORCINE) 5000 UNIT/ML IJ SOLN: 5000.0000 [IU] | Freq: Three times a day (TID) | INTRAMUSCULAR | Status: DC

## 2016-11-06 MED ORDER — APIXABAN 5 MG PO TABS: 5.0000 mg | ORAL_TABLET | Freq: Two times a day (BID) | ORAL | Status: DC

## 2016-11-06 MED ORDER — LOSARTAN POTASSIUM 25 MG PO TABS: 25.0000 mg | ORAL_TABLET | Freq: Every day | ORAL | Status: DC

## 2016-11-06 MED ORDER — ONDANSETRON HCL 4 MG PO TABS: 4.0000 mg | ORAL_TABLET | Freq: Four times a day (QID) | ORAL | Status: DC | PRN

## 2016-11-06 NOTE — ED Notes (Signed)
Pt back from MRI 

## 2016-11-06 NOTE — NC FL2 (Signed)
[place] LEVEL OF CARE SCREENING TOOL     IDENTIFICATION  Patient Name: Lisa [name] Birthdate: 1928/11/07 Sex: female Admission Date (Current Location): 11/05/2016  [place] and [place] Number:  Engineering geologist and Address:  [facility], [address] [place], [place] [address]      Provider Number: [id number]  Attending Physician Name and Address:  Vaughan Basta, *  Relative Name and Phone Number:  Zenovia Jordan Daughter [phone]     Current Level of Care: Hospital Recommended Level of Care: [place] Prior Approval Number:    Date Approved/Denied: 11/06/16 PASRR Number: [id number]  Discharge Plan: SNF    Current Diagnoses: Patient Active Problem List   Diagnosis Date Noted  . Intractable pain 11/06/2016  . Cystocele, midline 04/28/2016  . Incomplete bladder emptying 04/28/2016  . Cystocele 02/12/2015  . Vaginal enterocele 02/12/2015  . Absolute anemia 11/13/2014  . Benign essential HTN 11/13/2014  . APC (atrial premature contractions) 11/13/2014  . Awareness of heartbeats 11/13/2014  . Beat, premature ventricular 11/13/2014  . Arrhythmia, sinus node 11/13/2014  . Combined fat and carbohydrate induced hyperlipemia 04/23/2014  . MI (mitral incompetence) 04/23/2014  . AF (paroxysmal atrial fibrillation) ([place]) 04/23/2014  . Basal cell carcinoma of face 09/18/2012    Orientation RESPIRATION BLADDER Height & Weight     Self, Time, Situation, Place  Normal Continent Weight: 140 lb 1.6 oz (63.5 kg) Height:  5\' 3"  (160 cm)  BEHAVIORAL SYMPTOMS/MOOD NEUROLOGICAL BOWEL NUTRITION STATUS      Continent Diet (Heart healthy)  AMBULATORY STATUS COMMUNICATION OF NEEDS Skin   Extensive Assist Verbally Surgical wounds                       Personal Care Assistance Level of Assistance  Bathing, Feeding, Dressing Bathing Assistance: Maximum assistance Feeding assistance:  Independent Dressing Assistance: Maximum assistance     Functional Limitations Info             SPECIAL CARE FACTORS FREQUENCY  PT (By licensed PT)     PT Frequency: Up to 5X per day 5 days per week              Contractures Contractures Info: Not present    Additional Factors Info  Code Status, Allergies Code Status Info: Full Allergies Info: Ace Inhibitors, Benadryl Diphenhydramine Hcl (Sleep), Diphenhydramine-acetaminophen, Tylenol Pm Extra Diphenhydramine-apap (Sleep)           Current Medications (11/06/2016):  This is the current hospital active medication list Current Facility-Administered Medications  Medication Dose Route Frequency Provider Last Rate Last Dose  . acetaminophen (TYLENOL) tablet 650 mg  650 mg Oral Q6H PRN Harrie Foreman, MD       Or  . acetaminophen (TYLENOL) suppository 650 mg  650 mg Rectal Q6H PRN Harrie Foreman, MD      . apixaban Arne Cleveland) tablet 2.5 mg  2.5 mg Oral BID Harrie Foreman, MD      . cholecalciferol (VITAMIN D) tablet 1,000 Units  1,000 Units Oral Daily Harrie Foreman, MD   1,000 Units at 11/06/16 1054  . docusate sodium (COLACE) capsule 100 mg  100 mg Oral BID Harrie Foreman, MD   100 mg at 11/06/16 1055  . labetalol (NORMODYNE,TRANDATE) injection 5-10 mg  5-10 mg Intravenous Q2H PRN Harrie Foreman, MD      . losartan (COZAAR) tablet 50 mg  50 mg Oral Daily Vaughan Basta, MD  50 mg at 11/06/16 1055  . morphine 2 MG/ML injection 2 mg  2 mg Intravenous Q4H PRN Harrie Foreman, MD   2 mg at 11/06/16 0343  . morphine 2 MG/ML injection           . ondansetron (ZOFRAN) tablet 4 mg  4 mg Oral Q6H PRN Harrie Foreman, MD       Or  . ondansetron [facility]) injection 4 mg  4 mg Intravenous Q6H PRN Harrie Foreman, MD      . traMADol Veatrice Bourbon) tablet 100 mg  100 mg Oral Q6H PRN Harrie Foreman, MD   100 mg at 11/06/16 1114  . vitamin B-12 (CYANOCOBALAMIN) tablet 1,000 mcg  1,000 mcg Oral Daily  Harrie Foreman, MD   1,000 mcg at 11/06/16 1055     Discharge Medications: Please see discharge summary for a list of discharge medications.  Relevant Imaging Results:  Relevant Lab Results:   Additional Information SS# [id number]  Zettie Pho, LCSW

## 2016-11-06 NOTE — Care Management Note (Signed)
Case Management Note  Patient Details  Name: Lisa [name] MRN: [id number] Date of Birth: 1929/03/06  Subjective/Objective:    83yo Lisa Lisa [name] was admitted per back pain and difficulty walking and was found to have [name] L1 compression fracture. She lives alone but reports that her sister from [place] will be staying with her for [name] week. She also has neighbors and friends who can "check in on me." She has [name] RW and [name] cane at home. Takes chronic Eliquis for [name]-Fib. Lisa [name] reports that if she cannot drive herself she has friends who can transport her to her appointments. She normally performs all ADLs and drives her own car. [facility] [address]. PCP=Dr Paulita Cradle. No home oxygen. Has no home health services but chose [facility] to be her provider if [facility] services are ordered. Lisa [name] is requesting rehab and the weekend CSW reports that is possible with [facility] primary despite OBS status. Case management will follow for discharge planning.               Action/Plan:   Expected Discharge Date:  11/07/16               Expected Discharge Plan:  [place]  In-House Referral:  NA  Discharge planning Services  NA  Post Acute Care Choice:  Home Health Choice offered to:  Patient  DME Arranged:    DME Agency:     HH Arranged:    [place] Agency:  [facility] (prefers [facility])  Status of Service:  In process, will continue to follow  If discussed at Long Length of Stay Meetings, dates discussed:    Additional Comments:  Lisa Sheek A, RN 11/06/2016, 3:20 PM

## 2016-11-06 NOTE — ED Notes (Signed)
Addressed pt's BP with MD Marcille Blanco. Per MD, pt ok to move to floor with current BP and regular BP medication given in AM.

## 2016-11-06 NOTE — ED Notes (Signed)
Pt and daughter updated on admission process.  Daughter states she will take pt's belongings home except for her glasses.  Pt states she is comfortable right now and doesn't have pain as long as she doesn't move.  Remote for tv given.  Denies any needs at this time.

## 2016-11-06 NOTE — ED Notes (Signed)
Lights dimmed for comfort. Pt updated on admission process. Pt verbalizes understanding. Vss.

## 2016-11-06 NOTE — ED Notes (Signed)
Pt transported to MRI with EDT [place].

## 2016-11-06 NOTE — Progress Notes (Signed)
Apixaban changed to 2.5 mg BID for age >81 y/o and TBW <60 kg per MED 710-02.

## 2016-11-06 NOTE — Care Management Obs Status (Signed)
[facility] NOTIFICATION   Patient Details  Name: Lisa [name] MRN: [id number] Date of Birth: March 05, 1929   Medicare Observation Status Notification Given:  Yes    Juvencio Verdi A, RN 11/06/2016, 1:50 PM

## 2016-11-06 NOTE — Progress Notes (Signed)
Eliquis held due to pending surgery.

## 2016-11-06 NOTE — ED Notes (Signed)
Received call from 1A in order to address BP.

## 2016-11-06 NOTE — Consult Note (Addendum)
ORTHOPAEDIC CONSULTATION  REQUESTING PHYSICIAN: Vaughan Basta, *  Chief Complaint:   Mid lower back pain.  History of Present Illness: Lisa [name] is a 83 y.o. female history of atrial fibrillation, hypertension, and hyperlipidemia who lives independently. Apparently, she slipped and fell onto her buttock 6 days ago while [place] the beach. She elected not to seek treatment at that time, feeling that she merely "pulled something". She returned home 3 days ago and apparently had an x-ray of her lumbar spine anchor to clinic, but was not informed of its results. Because of continued pain, she presented to the emergency room last evening. X-rays and subsequent MRI scanning confirm the presence of a significant L1 compression fracture, so the patient was admitted for pain control. The patient also noted some difficulty initiating urination as well as difficulty emptying her bladder, prompting the MRI scan to be performed last evening. The patient denies any numbness or paresthesias to either lower extremity. She also denies any significant back problems in the past.  Past Medical History:  Diagnosis Date  . A-fib ([place])   . Basal cell carcinoma   . Constipation   . Cystocele   . Hyperlipemia   . Hypertension   . Incomplete bladder emptying   . Menopausal state   . Urethral caruncle   . Urinary urgency    Past Surgical History:  Procedure Laterality Date  . ABDOMINAL HYSTERECTOMY  1975   and bso  . APPENDECTOMY    . BASAL CELL CARCINOMA EXCISION    . BREAST CYST ASPIRATION Left   . CARDIOVERSION    . CATARACT EXTRACTION    . CHOLECYSTECTOMY    . ELECTROPHYSIOLOGIC STUDY N/A 09/02/2015   Procedure: Cardioversion;  Surgeon: Corey Skains, MD;  Location: [facility] ORS;  Service: Cardiovascular;  Laterality: N/A;  . ELECTROPHYSIOLOGIC STUDY N/A 10/08/2015   Procedure: CARDIOVERSION;  Surgeon: Corey Skains, MD;   Location: [facility] ORS;  Service: Cardiovascular;  Laterality: N/A;   Social History   Social History  . Marital status: Widowed    Spouse name: N/A  . Number of children: N/A  . Years of education: N/A   Social History Main Topics  . Smoking status: Never Smoker  . Smokeless tobacco: Never Used  . Alcohol use No  . Drug use: No  . Sexual activity: No   Other Topics Concern  . None   Social History Narrative  . None   Family History  Problem Relation Age of Onset  . Heart failure Father   . Breast cancer Sister 63  . Ovarian cancer Sister   . Heart failure Brother   . Colon cancer Neg Hx    Allergies  Allergen Reactions  . Ace Inhibitors Cough  . Benadryl [Diphenhydramine Hcl (Sleep)]   . Diphenhydramine-Acetaminophen Other (See Comments)    Reaction: Couldn't use kidneys  . Tylenol Pm Extra [Diphenhydramine-Apap (Sleep)]     Reaction: Couldn't use kidneys   Prior to Admission medications   Medication Sig Start Date End Date Taking? Authorizing Provider  apixaban (ELIQUIS) 5 MG TABS tablet Take 5 mg by mouth 2 (two) times daily.  03/16/15  Yes [provider]  Cholecalciferol (VITAMIN D3) 1000 UNITS CAPS Take 1 capsule by mouth daily.    Yes [provider]  losartan (COZAAR) 25 MG tablet Take 25 mg by mouth daily.    Yes [provider]  vitamin B-12 (CYANOCOBALAMIN) 1000 MCG tablet Take 1,000 mcg by mouth daily.   Yes [provider]   Ct Lumbar Spine Wo Contrast  Result Date: 11/05/2016 CLINICAL DATA:  Slipped and fell on back 6 days ago, with persistent lower back pain. Initial encounter. EXAM: CT LUMBAR SPINE WITHOUT CONTRAST TECHNIQUE: Multidetector CT imaging of the lumbar spine was performed without intravenous contrast administration. Multiplanar CT image reconstructions were also generated. COMPARISON:  CT of the abdomen and pelvis performed 10/11/2016 FINDINGS: Segmentation: 5 lumbar type vertebrae. Alignment: Normal.  Vertebrae: There is severe acute compression fracture of vertebral body L1, with approximately 70% loss of height. There is approximately 8 mm of retropulsion, with narrowing of the spinal canal to 9 mm in AP dimension. Mild endplate sclerotic change is noted at L2-L3 and at L5-S1. Paraspinal and other soft tissues: The visualized paraspinal musculature is grossly unremarkable. Scattered calcification is noted along the abdominal aorta and its branches. Visualized small and large bowel loops are grossly unremarkable. A right renal cyst is noted. Disc levels: Mild vacuum phenomenon is noted at L1-L2. Vacuum phenomenon is noted at T11-T12. Posterior disc protrusions are noted at L2-L3 and L5-S1, without definite evidence of impression on exiting nerve roots. Remaining intervertebral disc spaces are preserved. IMPRESSION: 1. Severe acute compression fracture of vertebral body L1, with approximately 70% loss of height. Approximately 8 mm of retropulsion, with narrowing of the spinal canal to 9 mm in AP dimension. 2. Mild degenerative change along the lower thoracic and lumbar spine. 3. Scattered aortic atherosclerosis. 4. Right renal cyst noted. Electronically Signed   By: Garald Balding M.D.   On: 11/05/2016 22:02   Mr Lumbar Spine Limited Wo Contrast  Result Date: 11/06/2016 CLINICAL DATA:  83 y/o F; fell on back 6 days ago with lower back pain. Unable to void 2 days. EXAM: MRI LUMBAR SPINE WITHOUT CONTRAST TECHNIQUE: Multiplanar, multisequence MR imaging of the lumbar spine was performed. No intravenous contrast was administered. COMPARISON:  11/05/2016 CT of the abdomen and pelvis. FINDINGS: Segmentation:  Standard. Alignment:  Physiologic. Vertebrae: L1 vertebral body compression deformity with 70% severe loss of height and edema in the vertebral body indicating recent injury. 6 mm retropulsion of the vertebral body. Edema within opposing L2 and L3 endplates without loss of height may represent degenerative  changes or bone contusion. Edema within the anterior opposing T11-12 endplates is likely degenerative. Conus medullaris: Extends to the L2 level and appears normal. Paraspinal and other soft tissues: Large partially visualized right kidney upper pole cysts. Disc levels: L1-2: Retropulsion of L1 vertebral body results in mild-to-moderate canal stenosis. No significant foraminal stenosis. L2-3: Moderate disc bulge and mild facet hypertrophy. Mild bilateral foraminal stenosis. No significant canal stenosis. L3-4: Small disc bulge eccentric to the right with mild facet hypertrophy. Mild right foraminal stenosis. No significant canal stenosis. L4-5: Small disc bulge with mild facet hypertrophy. Mild bilateral foraminal stenosis. No significant canal stenosis. L5-S1: Small disc bulge with marginal osteophytes and moderate facet hypertrophy. Moderate to severe bilateral foraminal stenosis. No significant canal stenosis. IMPRESSION: 1. Recent L1 severe compression deformity with 70% loss of height and 6 mm bony retropulsion. 2. L1-2 mild-to-moderate canal stenosis due to bony retropulsion. 3. L2-3 endplate edema without vertebral body loss of height may be degenerative disease with bone contusion. 4. No other acute fracture identified. 5. Lumbar spondylosis greatest at L5-S1 level were disc and facet disease results in moderate to severe bilateral foraminal stenosis. Electronically Signed   By: Kristine Garbe M.D.   On: 11/06/2016 01:26   Dg Chest [facility] 1 [address]  Result Date: 11/06/2016 CLINICAL DATA:  L1 compression deformity, shortness of Breath EXAM: PORTABLE CHEST 1 VIEW COMPARISON:  10/11/2016 FINDINGS: Cardiac shadow is mildly enlarged. Aortic calcifications are seen. The lungs are well aerated bilaterally very minimal blunting of left costophrenic angle is noted which is stable from prior CT examination. Calcified granuloma is noted in the left lung base as well. IMPRESSION: Chronic changes without acute  abnormality. Electronically Signed   By: Inez Catalina M.D.   On: 11/06/2016 08:52    Positive ROS: All other systems have been reviewed and were otherwise negative with the exception of those mentioned in the HPI and as above.  Physical Exam: General:  Alert, no acute distress Psychiatric:  Patient is competent for consent with normal mood and affect   Cardiovascular:  No pedal edema Respiratory:  No wheezing, non-labored breathing GI:  Abdomen is soft and non-tender Skin:  No lesions in the area of chief complaint Neurologic:  Sensation intact distally Lymphatic:  No axillary or cervical lymphadenopathy  Orthopedic Exam:  Orthopedic examination is limited to the patient's back and lower extremities. Skin inspection of the lower back is unremarkable. She has moderate focal tenderness to percussion over the upper lumbar spine region. Examination of both lower extremities demonstrates that she is neurovascularly intact to both lower extremities. She is able to actively dorsiflex and plantarflex both ankles and toes bilaterally. Sensation is intact to light touch to all distributions of both lower extremity. She has excellent capillary refill to both feet. She has intact sensation to the perineal region and good anal sphincter tone.  X-rays:  X-rays of the lumbar spine are available for review. The findings are as described above. There is a significant L1 compression fracture measuring approximately 70% with mild retropulsion of the posterior cortex.  An MRI scan of the lumbar spine also is available for review. By report, the scan demonstrates an acute/subacute L1 compression fracture with 6 mm of retropulsion of bone into the canal. However, the canal still appears to have sufficient to accommodate the spinal cord. There does not appear to be any intra-cord damage. Both the MRI scan and plain radiographs were reviewed by myself and discussed with the patient and her  daughter.  Assessment: Significant L1 compression fracture without evidence of cauda equina syndrome.  Plan: The treatment options are discussed with the patient and her daughter, who is at the bedside. After discussing both nonsurgical treatment and the possible use of kyphoplasty to stabilize the fracture, the patient would like to proceed with a kyphoplasty. Therefore, I will discuss this with Dr. Rudene Christians, my partner who performs this procedure. Meanwhile, I would hold her Eliquis so that Dr. Rudene Christians can do this procedure as soon as possible, if he feels that she is an appropriate candidate. Meanwhile, the patient can be mobilized as symptoms permit, and provided appropriate pain medication as necessary for comfort.  Thank you for ask me to participate in the care of this most pleasant woman. I will be happy to follow her with you.   Pascal Lux, MD  Beeper #:  [phone]  11/06/2016 1:01 PM

## 2016-11-06 NOTE — H&P (Signed)
Lisa [name] is an 83 y.o. female.   Chief Complaint: Urinary retention HPI: The patient with past medical history of atrial fibrillation and incomplete bladder emptying presents emergency department complaining of difficulty urinating. The patient reports difficulty initiating and hesitancy with urination. She reports a fall a few days ago where she fell directly onto her tailbone. It is been difficult to stand and walk since that time. She denies numbness or weakness in her legs. CT of her lumbar spine did not demonstrate fracture of her coccyx but did show a compression fracture of L1. MRI of her back confirmed no epidural hematoma or cauda equina compression. However, due to inability to ambulate due to severe pain the emergency department staff called the hospitalist service for admission.  Past Medical History:  Diagnosis Date  . A-fib ([place])   . Basal cell carcinoma   . Constipation   . Cystocele   . Hyperlipemia   . Hypertension   . Incomplete bladder emptying   . Menopausal state   . Urethral caruncle   . Urinary urgency     Past Surgical History:  Procedure Laterality Date  . ABDOMINAL HYSTERECTOMY  1975   and bso  . APPENDECTOMY    . BASAL CELL CARCINOMA EXCISION    . BREAST CYST ASPIRATION Left   . CARDIOVERSION    . CATARACT EXTRACTION    . CHOLECYSTECTOMY    . ELECTROPHYSIOLOGIC STUDY N/A 09/02/2015   Procedure: Cardioversion;  Surgeon: Corey Skains, MD;  Location: [facility] ORS;  Service: Cardiovascular;  Laterality: N/A;  . ELECTROPHYSIOLOGIC STUDY N/A 10/08/2015   Procedure: CARDIOVERSION;  Surgeon: Corey Skains, MD;  Location: [facility] ORS;  Service: Cardiovascular;  Laterality: N/A;    Family History  Problem Relation Age of Onset  . Heart failure Father   . Breast cancer Sister 19  . Ovarian cancer Sister   . Heart failure Brother   . Colon cancer Neg Hx    Social History:  reports that she has never smoked. She has never used smokeless tobacco. She  reports that she does not drink alcohol or use drugs.  Allergies:  Allergies  Allergen Reactions  . Ace Inhibitors Cough  . Benadryl [Diphenhydramine Hcl (Sleep)]   . Diphenhydramine-Acetaminophen Other (See Comments)    Reaction: Couldn't use kidneys  . Tylenol Pm Extra [Diphenhydramine-Apap (Sleep)]     Reaction: Couldn't use kidneys    Medications Prior to Admission  Medication Sig Dispense Refill  . apixaban (ELIQUIS) 5 MG TABS tablet Take 5 mg by mouth 2 (two) times daily.     . Cholecalciferol (VITAMIN D3) 1000 UNITS CAPS Take 1 capsule by mouth daily.     Marland Kitchen losartan (COZAAR) 25 MG tablet Take 25 mg by mouth daily.     . vitamin B-12 (CYANOCOBALAMIN) 1000 MCG tablet Take 1,000 mcg by mouth daily.      Results for orders placed or performed during the hospital encounter of 11/05/16 (from the past 48 hour(s))  Comprehensive metabolic panel     Status: Abnormal   Collection Time: 11/05/16  9:21 PM  Result Value Ref Range   Sodium 139 135 - 145 mmol/L   Potassium 3.5 3.5 - 5.1 mmol/L   Chloride 105 101 - 111 mmol/L   CO2 26 22 - 32 mmol/L   Glucose, Bld 126 (H) 65 - 99 mg/dL   BUN 27 (H) 6 - 20 mg/dL   Creatinine, Ser 0.71 0.44 - 1.00 mg/dL   Calcium 9.0 8.9 -  10.3 mg/dL   Total Protein 6.4 (L) 6.5 - 8.1 g/dL   Albumin 3.6 3.5 - 5.0 g/dL   AST 26 15 - 41 U/L   ALT 23 14 - 54 U/L   Alkaline Phosphatase 79 38 - 126 U/L   Total Bilirubin 0.8 0.3 - 1.2 mg/dL   GFR calc non Af Amer >60 >60 mL/min   GFR calc Af Amer >60 >60 mL/min    Comment: (NOTE) The eGFR has been calculated using the CKD EPI equation. This calculation has not been validated in all clinical situations. eGFR's persistently <60 mL/min signify possible Chronic Kidney Disease.    Anion gap 8 5 - 15  CBC with Differential     Status: Abnormal   Collection Time: 11/05/16  9:21 PM  Result Value Ref Range   WBC 13.4 (H) 3.6 - 11.0 K/uL   RBC 4.55 3.80 - 5.20 MIL/uL   Hemoglobin 13.7 12.0 - 16.0 g/dL   HCT  41.3 35.0 - 47.0 %   MCV 90.8 80.0 - 100.0 fL   MCH 30.2 26.0 - 34.0 pg   MCHC 33.2 32.0 - 36.0 g/dL   RDW 14.6 (H) 11.5 - 14.5 %   Platelets 180 150 - 440 K/uL   Neutrophils Relative % 84 %   Neutro Abs 11.2 (H) 1.4 - 6.5 K/uL   Lymphocytes Relative 8 %   Lymphs Abs 1.1 1.0 - 3.6 K/uL   Monocytes Relative 7 %   Monocytes Absolute 0.9 0.2 - 0.9 K/uL   Eosinophils Relative 0 %   Eosinophils Absolute 0.0 0 - 0.7 K/uL   Basophils Relative 1 %   Basophils Absolute 0.1 0 - 0.1 K/uL  Urinalysis, Complete w Microscopic     Status: Abnormal   Collection Time: 11/05/16  9:21 PM  Result Value Ref Range   Color, Urine AMBER (A) YELLOW    Comment: BIOCHEMICALS MAY BE AFFECTED BY COLOR   APPearance HAZY (A) CLEAR   Specific Gravity, Urine 1.032 (H) 1.005 - 1.030   pH 5.0 5.0 - 8.0   Glucose, UA NEGATIVE NEGATIVE mg/dL   Hgb urine dipstick NEGATIVE NEGATIVE   Bilirubin Urine NEGATIVE NEGATIVE   Ketones, ur 20 (A) NEGATIVE mg/dL   Protein, ur 100 (A) NEGATIVE mg/dL   Nitrite NEGATIVE NEGATIVE   Leukocytes, UA TRACE (A) NEGATIVE   RBC / HPF 6-30 0 - 5 RBC/hpf   WBC, UA 0-5 0 - 5 WBC/hpf   Bacteria, UA NONE SEEN NONE SEEN   Squamous Epithelial / LPF NONE SEEN NONE SEEN   Mucous PRESENT   TSH     Status: None   Collection Time: 11/05/16  9:21 PM  Result Value Ref Range   TSH 2.124 0.350 - 4.500 uIU/mL    Comment: Performed by a 3rd Generation assay with a functional sensitivity of <=0.01 uIU/mL.   Ct Lumbar Spine Wo Contrast  Result Date: 11/05/2016 CLINICAL DATA:  Slipped and fell on back 6 days ago, with persistent lower back pain. Initial encounter. EXAM: CT LUMBAR SPINE WITHOUT CONTRAST TECHNIQUE: Multidetector CT imaging of the lumbar spine was performed without intravenous contrast administration. Multiplanar CT image reconstructions were also generated. COMPARISON:  CT of the abdomen and pelvis performed 10/11/2016 FINDINGS: Segmentation: 5 lumbar type vertebrae. Alignment: Normal.  Vertebrae: There is severe acute compression fracture of vertebral body L1, with approximately 70% loss of height. There is approximately 8 mm of retropulsion, with narrowing of the spinal canal to 9 mm in  AP dimension. Mild endplate sclerotic change is noted at L2-L3 and at L5-S1. Paraspinal and other soft tissues: The visualized paraspinal musculature is grossly unremarkable. Scattered calcification is noted along the abdominal aorta and its branches. Visualized small and large bowel loops are grossly unremarkable. A right renal cyst is noted. Disc levels: Mild vacuum phenomenon is noted at L1-L2. Vacuum phenomenon is noted at T11-T12. Posterior disc protrusions are noted at L2-L3 and L5-S1, without definite evidence of impression on exiting nerve roots. Remaining intervertebral disc spaces are preserved. IMPRESSION: 1. Severe acute compression fracture of vertebral body L1, with approximately 70% loss of height. Approximately 8 mm of retropulsion, with narrowing of the spinal canal to 9 mm in AP dimension. 2. Mild degenerative change along the lower thoracic and lumbar spine. 3. Scattered aortic atherosclerosis. 4. Right renal cyst noted. Electronically Signed   By: Garald Balding M.D.   On: 11/05/2016 22:02   Mr Lumbar Spine Limited Wo Contrast  Result Date: 11/06/2016 CLINICAL DATA:  83 y/o F; fell on back 6 days ago with lower back pain. Unable to void 2 days. EXAM: MRI LUMBAR SPINE WITHOUT CONTRAST TECHNIQUE: Multiplanar, multisequence MR imaging of the lumbar spine was performed. No intravenous contrast was administered. COMPARISON:  11/05/2016 CT of the abdomen and pelvis. FINDINGS: Segmentation:  Standard. Alignment:  Physiologic. Vertebrae: L1 vertebral body compression deformity with 70% severe loss of height and edema in the vertebral body indicating recent injury. 6 mm retropulsion of the vertebral body. Edema within opposing L2 and L3 endplates without loss of height may represent degenerative  changes or bone contusion. Edema within the anterior opposing T11-12 endplates is likely degenerative. Conus medullaris: Extends to the L2 level and appears normal. Paraspinal and other soft tissues: Large partially visualized right kidney upper pole cysts. Disc levels: L1-2: Retropulsion of L1 vertebral body results in mild-to-moderate canal stenosis. No significant foraminal stenosis. L2-3: Moderate disc bulge and mild facet hypertrophy. Mild bilateral foraminal stenosis. No significant canal stenosis. L3-4: Small disc bulge eccentric to the right with mild facet hypertrophy. Mild right foraminal stenosis. No significant canal stenosis. L4-5: Small disc bulge with mild facet hypertrophy. Mild bilateral foraminal stenosis. No significant canal stenosis. L5-S1: Small disc bulge with marginal osteophytes and moderate facet hypertrophy. Moderate to severe bilateral foraminal stenosis. No significant canal stenosis. IMPRESSION: 1. Recent L1 severe compression deformity with 70% loss of height and 6 mm bony retropulsion. 2. L1-2 mild-to-moderate canal stenosis due to bony retropulsion. 3. L2-3 endplate edema without vertebral body loss of height may be degenerative disease with bone contusion. 4. No other acute fracture identified. 5. Lumbar spondylosis greatest at L5-S1 level were disc and facet disease results in moderate to severe bilateral foraminal stenosis. Electronically Signed   By: Kristine Garbe M.D.   On: 11/06/2016 01:26    Review of Systems  Constitutional: Negative for chills and fever.  HENT: Negative for sore throat and tinnitus.   Eyes: Negative for blurred vision and redness.  Respiratory: Negative for cough and shortness of breath.   Cardiovascular: Negative for chest pain, palpitations, orthopnea and PND.  Gastrointestinal: Negative for abdominal pain, diarrhea, nausea and vomiting.  Genitourinary: Negative for dysuria, frequency and urgency.  Musculoskeletal: Positive for back  pain and falls. Negative for joint pain and myalgias.  Skin: Negative for rash.       No lesions  Neurological: Negative for speech change, focal weakness and weakness.  Endo/Heme/Allergies: Does not bruise/bleed easily.       No  temperature intolerance  Psychiatric/Behavioral: Negative for depression and suicidal ideas.    Blood pressure (!) 197/81, pulse 83, temperature 97.8 F (36.6 C), temperature source Oral, resp. rate 18, height 5' 3"  (1.6 m), weight 63.5 kg (140 lb 1.6 oz), SpO2 94 %. Physical Exam  Vitals reviewed. Constitutional: She is oriented to person, place, and time. She appears well-developed and well-nourished. No distress.  HENT:  Head: Normocephalic and atraumatic.  Mouth/Throat: Oropharynx is clear and moist.  Eyes: Pupils are equal, round, and reactive to light. Conjunctivae and EOM are normal. No scleral icterus.  Neck: Normal range of motion. Neck supple. No JVD present. No tracheal deviation present. No thyromegaly present.  Cardiovascular: Normal rate, regular rhythm and normal heart sounds.  Exam reveals no gallop and no friction rub.   No murmur heard. Respiratory: Effort normal and breath sounds normal.  GI: Soft. Bowel sounds are normal. She exhibits no distension. There is no tenderness.  Genitourinary:  Genitourinary Comments: Deferred  Musculoskeletal: Normal range of motion. She exhibits no edema.  Lymphadenopathy:    She has no cervical adenopathy.  Neurological: She is alert and oriented to person, place, and time. No cranial nerve deficit. She exhibits normal muscle tone.  Skin: Skin is warm and dry. No rash noted. No erythema.  Psychiatric: She has a normal mood and affect. Her behavior is normal. Judgment and thought content normal.     Assessment/Plan This is an 83 year old female admitted for intractable pain. 1. Intractable pain: Lower back; fractured lumbar vertebrae. Manage pain and consult orthopedic surgery. 2. Leukocytosis: Unclear  etiology. Chest x-ray not yet obtained. Rule out pneumonia. Urine is negative for infection. May be inflammatory response from fall and pain. 3. A. fib: Rate controlled; continue Eliquis 4. Hypertension: Uncontrolled; likely secondary to pain. Continue antihypertensives per home regimen. Labetalol as needed 5. DVT prophylaxis: SCDs 6. GI prophylaxis: None The patient is a full code. Time spent on admission orders and patient care possibly 45 minutes   Harrie Foreman, MD 11/06/2016, 7:06 AM

## 2016-11-06 NOTE — Progress Notes (Signed)
Per Dr. Roland Rack, please hold Eliquis pending surgical consult with Dr. Rudene Christians tomorrow. Order was not d/c'd, report given to oncoming shift

## 2016-11-06 NOTE — Progress Notes (Signed)
[facility] [place] NAME: Lisa [name]    MR#:  [id number]  DATE OF BIRTH:  April 19, 1928  SUBJECTIVE:  CHIEF COMPLAINT:   Chief Complaint  Patient presents with  . Urinary Retention    Came with a fall, and have pain in back- have L1 compression fracture and have pain with movements today. No localized weakness.  REVIEW OF SYSTEMS:  CONSTITUTIONAL: No fever, fatigue or weakness.  EYES: No blurred or double vision.  EARS, NOSE, AND THROAT: No tinnitus or ear pain.  RESPIRATORY: No cough, shortness of breath, wheezing or hemoptysis.  CARDIOVASCULAR: No chest pain, orthopnea, edema.  GASTROINTESTINAL: No nausea, vomiting, diarrhea or abdominal pain.  GENITOURINARY: No dysuria, hematuria.  ENDOCRINE: No polyuria, nocturia,  HEMATOLOGY: No anemia, easy bruising or bleeding SKIN: No rash or lesion. MUSCULOSKELETAL: No joint pain or arthritis.  Have back pain. NEUROLOGIC: No tingling, numbness, weakness.  PSYCHIATRY: No anxiety or depression.   ROS  DRUG ALLERGIES:   Allergies  Allergen Reactions  . Ace Inhibitors Cough  . Benadryl [Diphenhydramine Hcl (Sleep)]   . Diphenhydramine-Acetaminophen Other (See Comments)    Reaction: Couldn't use kidneys  . Tylenol Pm Extra [Diphenhydramine-Apap (Sleep)]     Reaction: Couldn't use kidneys    VITALS:  Blood pressure (!) 169/73, pulse 77, temperature 98.6 F (37 C), temperature source Oral, resp. rate 16, height 5\' 3"  (1.6 m), weight 63.5 kg (140 lb 1.6 oz), SpO2 94 %.  PHYSICAL EXAMINATION:  GENERAL:  83 y.o.-year-old patient lying in the bed with no acute distress.  EYES: Pupils equal, round, reactive to light and accommodation. No scleral icterus. Extraocular muscles intact.  HEENT: Head atraumatic, normocephalic. Oropharynx and nasopharynx clear.  NECK:  Supple, no jugular venous distention. No thyroid enlargement, no tenderness.  LUNGS: Normal breath sounds bilaterally, no  wheezing, rales,rhonchi or crepitation. No use of accessory muscles of respiration.  CARDIOVASCULAR: S1, S2 normal. No murmurs, rubs, or gallops.  ABDOMEN: Soft, nontender, nondistended. Bowel sounds present. No organomegaly or mass. Tender in lower back EXTREMITIES: No pedal edema, cyanosis, or clubbing.  NEUROLOGIC: Cranial nerves II through XII are intact. Muscle strength 5/5 in all extremities. Sensation intact. Gait not checked.  PSYCHIATRIC: The patient is alert and oriented x 3.  SKIN: No obvious rash, lesion, or ulcer.   Physical Exam LABORATORY PANEL:   CBC  Recent Labs Lab 11/05/16 2121  WBC 13.4*  HGB 13.7  HCT 41.3  PLT 180   ------------------------------------------------------------------------------------------------------------------  Chemistries   Recent Labs Lab 11/05/16 2121  NA 139  K 3.5  CL 105  CO2 26  GLUCOSE 126*  BUN 27*  CREATININE 0.71  CALCIUM 9.0  AST 26  ALT 23  ALKPHOS 79  BILITOT 0.8   ------------------------------------------------------------------------------------------------------------------  Cardiac Enzymes No results for input(s): TROPONINI in the last 168 hours. ------------------------------------------------------------------------------------------------------------------  RADIOLOGY:  Ct Lumbar Spine Wo Contrast  Result Date: 11/05/2016 CLINICAL DATA:  Slipped and fell on back 6 days ago, with persistent lower back pain. Initial encounter. EXAM: CT LUMBAR SPINE WITHOUT CONTRAST TECHNIQUE: Multidetector CT imaging of the lumbar spine was performed without intravenous contrast administration. Multiplanar CT image reconstructions were also generated. COMPARISON:  CT of the abdomen and pelvis performed 10/11/2016 FINDINGS: Segmentation: 5 lumbar type vertebrae. Alignment: Normal. Vertebrae: There is severe acute compression fracture of vertebral body L1, with approximately 70% loss of height. There is approximately 8 mm of  retropulsion, with narrowing of the spinal canal to 9  mm in AP dimension. Mild endplate sclerotic change is noted at L2-L3 and at L5-S1. Paraspinal and other soft tissues: The visualized paraspinal musculature is grossly unremarkable. Scattered calcification is noted along the abdominal aorta and its branches. Visualized small and large bowel loops are grossly unremarkable. A right renal cyst is noted. Disc levels: Mild vacuum phenomenon is noted at L1-L2. Vacuum phenomenon is noted at T11-T12. Posterior disc protrusions are noted at L2-L3 and L5-S1, without definite evidence of impression on exiting nerve roots. Remaining intervertebral disc spaces are preserved. IMPRESSION: 1. Severe acute compression fracture of vertebral body L1, with approximately 70% loss of height. Approximately 8 mm of retropulsion, with narrowing of the spinal canal to 9 mm in AP dimension. 2. Mild degenerative change along the lower thoracic and lumbar spine. 3. Scattered aortic atherosclerosis. 4. Right renal cyst noted. Electronically Signed   By: Garald Balding M.D.   On: 11/05/2016 22:02   Mr Lumbar Spine Limited Wo Contrast  Result Date: 11/06/2016 CLINICAL DATA:  83 y/o F; fell on back 6 days ago with lower back pain. Unable to void 2 days. EXAM: MRI LUMBAR SPINE WITHOUT CONTRAST TECHNIQUE: Multiplanar, multisequence MR imaging of the lumbar spine was performed. No intravenous contrast was administered. COMPARISON:  11/05/2016 CT of the abdomen and pelvis. FINDINGS: Segmentation:  Standard. Alignment:  Physiologic. Vertebrae: L1 vertebral body compression deformity with 70% severe loss of height and edema in the vertebral body indicating recent injury. 6 mm retropulsion of the vertebral body. Edema within opposing L2 and L3 endplates without loss of height may represent degenerative changes or bone contusion. Edema within the anterior opposing T11-12 endplates is likely degenerative. Conus medullaris: Extends to the L2 level and  appears normal. Paraspinal and other soft tissues: Large partially visualized right kidney upper pole cysts. Disc levels: L1-2: Retropulsion of L1 vertebral body results in mild-to-moderate canal stenosis. No significant foraminal stenosis. L2-3: Moderate disc bulge and mild facet hypertrophy. Mild bilateral foraminal stenosis. No significant canal stenosis. L3-4: Small disc bulge eccentric to the right with mild facet hypertrophy. Mild right foraminal stenosis. No significant canal stenosis. L4-5: Small disc bulge with mild facet hypertrophy. Mild bilateral foraminal stenosis. No significant canal stenosis. L5-S1: Small disc bulge with marginal osteophytes and moderate facet hypertrophy. Moderate to severe bilateral foraminal stenosis. No significant canal stenosis. IMPRESSION: 1. Recent L1 severe compression deformity with 70% loss of height and 6 mm bony retropulsion. 2. L1-2 mild-to-moderate canal stenosis due to bony retropulsion. 3. L2-3 endplate edema without vertebral body loss of height may be degenerative disease with bone contusion. 4. No other acute fracture identified. 5. Lumbar spondylosis greatest at L5-S1 level were disc and facet disease results in moderate to severe bilateral foraminal stenosis. Electronically Signed   By: Kristine Garbe M.D.   On: 11/06/2016 01:26   Dg Chest Port 1 View  Result Date: 11/06/2016 CLINICAL DATA:  L1 compression deformity, shortness of Breath EXAM: PORTABLE CHEST 1 VIEW COMPARISON:  10/11/2016 FINDINGS: Cardiac shadow is mildly enlarged. Aortic calcifications are seen. The lungs are well aerated bilaterally very minimal blunting of left costophrenic angle is noted which is stable from prior CT examination. Calcified granuloma is noted in the left lung base as well. IMPRESSION: Chronic changes without acute abnormality. Electronically Signed   By: Inez Catalina M.D.   On: 11/06/2016 08:52    ASSESSMENT AND PLAN:   Active Problems:   Intractable  pain  1. Intractable pain: Lower back; fractured lumbar vertebrae. Manage pain  and consult orthopedic surgery.   May need vertebral fusion? 2. Leukocytosis: Unclear etiology. Chest x-ray - no acute findings. Urine is negative for infection. May be inflammatory response from fall and pain. 3. A. fib: Rate controlled; continue Eliquis   If she needs procedure for vertebra, may need to hold eliquis. She had 3 falls in last 12-14 months. 4. Hypertension: Uncontrolled; likely secondary to pain. Continue antihypertensives per home regimen. Labetalol as needed   Increased losartan to 50 mg daily. Stopped IV fluids. 5. DVT prophylaxis: SCDs 6. GI prophylaxis: None    All the records are reviewed and case discussed with Care Management/Social Workerr. Management plans discussed with the patient, family and they are in agreement.  CODE STATUS: full.  TOTAL TIME TAKING CARE OF THIS PATIENT: 35 minutes.     POSSIBLE D/C IN 1-2 DAYS, DEPENDING ON CLINICAL CONDITION.   Vaughan Basta M.D on 11/06/2016   Between 7am to 6pm - Pager - [phone]  After 6pm go to [email or web address] - password [email or web address] [facility]  Office  [phone]  CC: Primary care physician; Marinda Elk, MD  Note: This dictation was prepared with Dragon dictation along with smaller phrase technology. Any transcriptional errors that result from this process are unintentional.

## 2016-11-06 NOTE — ED Provider Notes (Signed)
-----------------------------------------   1:28 AM on 11/06/2016 -----------------------------------------  Assume care of patient at change of shift. She is now back from MRI. Resting in no acute distress. Denies pain at rest but reports severe pain on movement. Awaiting results of MRI.  ----------------------------------------- 2:04 AM on 11/06/2016 -----------------------------------------  MRI lumbar spine interpreted per Dr. Toney Reil: 1. Recent L1 severe compression deformity with 70% loss of height  and 6 mm bony retropulsion.  2. L1-2 mild-to-moderate canal stenosis due to bony retropulsion.  3. L2-3 endplate edema without vertebral body loss of height may be  degenerative disease with bone contusion.  4. No other acute fracture identified.  5. Lumbar spondylosis greatest at L5-S1 level were disc and facet  disease results in moderate to severe bilateral foraminal stenosis.   Updated patient and her daughter of MRI results. We'll discuss with hospitalist to evaluate in the emergency department for admission for intractable back pain, physical therapy and orthopedics consults.   Paulette Blanch, MD 11/06/16 [phone]

## 2016-11-07 LAB — MISC LABCORP TEST (SEND OUT): Labcorp test code: 1453

## 2016-11-07 LAB — CBC
HEMATOCRIT: 40.3 % (ref 35.0–47.0)
Hemoglobin: 13.5 g/dL (ref 12.0–16.0)
MCH: 30.6 pg (ref 26.0–34.0)
MCHC: 33.5 g/dL (ref 32.0–36.0)
MCV: 91.2 fL (ref 80.0–100.0)
PLATELETS: 184 10*3/uL (ref 150–440)
RBC: 4.41 MIL/uL (ref 3.80–5.20)
RDW: 14.9 % — AB (ref 11.5–14.5)
WBC: 8.3 10*3/uL (ref 3.6–11.0)

## 2016-11-07 LAB — GLUCOSE, CAPILLARY: Glucose-Capillary: 135 mg/dL — ABNORMAL HIGH (ref 65–99)

## 2016-11-07 MED ORDER — HEPARIN SODIUM (PORCINE) 5000 UNIT/ML IJ SOLN
5000.0000 [IU] | Freq: Three times a day (TID) | INTRAMUSCULAR | Status: DC
  Administered 2016-11-07: 5000 [IU] via SUBCUTANEOUS
  Filled 2016-11-07 (×2): qty 1

## 2016-11-07 MED ORDER — POLYETHYLENE GLYCOL 3350 17 G PO PACK
17.0000 g | PACK | Freq: Every day | ORAL | Status: DC
  Administered 2016-11-07 – 2016-11-09 (×2): 17 g via ORAL
  Filled 2016-11-07 (×2): qty 1

## 2016-11-07 MED ORDER — BISACODYL 10 MG RE SUPP: 10.0000 mg | Freq: Every day | RECTAL | Status: DC | PRN

## 2016-11-07 MED ORDER — DEXAMETHASONE SODIUM PHOSPHATE 4 MG/ML IJ SOLN
4.0000 mg | Freq: Two times a day (BID) | INTRAMUSCULAR | Status: DC
  Administered 2016-11-07 – 2016-11-09 (×4): 4 mg via INTRAVENOUS
  Filled 2016-11-07 (×5): qty 1

## 2016-11-07 MED ORDER — CEFAZOLIN SODIUM-DEXTROSE 1-4 GM/50ML-% IV SOLN
1.0000 g | Freq: Once | INTRAVENOUS | Status: AC
  Administered 2016-11-08: 1 g via INTRAVENOUS
  Filled 2016-11-07: qty 50

## 2016-11-07 NOTE — Progress Notes (Signed)
A&O x4. VSS. Foley in place. Tolerating well. Has been in bed all day. Consent for surgery signed and placed in chart. Tolerated food and meds well.

## 2016-11-07 NOTE — Progress Notes (Signed)
ANTICOAGULATION CONSULT NOTE - Initial Consult  Pharmacy Consult for Heparin Prophylaxis (subqutaneous) Indication: VTE prophylaxis  Allergies  Allergen Reactions  . Ace Inhibitors Cough  . Benadryl [Diphenhydramine Hcl (Sleep)]   . Diphenhydramine-Acetaminophen Other (See Comments)    Reaction: Couldn't use kidneys  . Tylenol Pm Extra [Diphenhydramine-Apap (Sleep)]     Reaction: Couldn't use kidneys    Patient Measurements: Height: 5\' 3"  (160 cm) Weight: 142 lb (64.4 kg) IBW/kg (Calculated) : 52.4 Heparin Dosing Weight:    Vital Signs: Temp: 98 F (36.7 C) (08/13 1554) Temp Source: Oral (08/13 1554) BP: 151/70 (08/13 1554) Pulse Rate: 70 (08/13 1554)  Labs:  Recent Labs  11/05/16 2121  HGB 13.7  HCT 41.3  PLT 180  CREATININE 0.71    Estimated Creatinine Clearance: 46.4 mL/min (by C-G formula based on SCr of 0.71 mg/dL).   Medical History: Past Medical History:  Diagnosis Date  . A-fib ([place])   . Basal cell carcinoma   . Constipation   . Cystocele   . Hyperlipemia   . Hypertension   . Incomplete bladder emptying   . Menopausal state   . Urethral caruncle   . Urinary urgency     Medications:  Scheduled:  . cholecalciferol  1,000 Units Oral Daily  . dexamethasone  4 mg Intravenous Q12H  . docusate sodium  100 mg Oral BID  . heparin subcutaneous  5,000 Units Subcutaneous Q8H  . losartan  50 mg Oral Daily  . polyethylene glycol  17 g Oral Daily  . vitamin B-12  1,000 mcg Oral Daily    Assessment: 83 yo F on Apixaban at home for Afib. Last dose per Med Rec was on 11/05/16.   Goal of Therapy:  Monitor platelets by anticoagulation protocol: Yes   Plan:  Will order Heparin 5000 units subcutaneously q8h for VTE prophylaxis- per discussion with Dr. Anselm Jungling.  Patient scheduled for surgery tomorrow 8/14. CBC ordered.  Mendel Binsfeld A 11/07/2016,4:28 PM

## 2016-11-07 NOTE — Care Management Note (Signed)
Case Management Note  Patient Details  Name: Lisa [name] MRN: [id number] Date of Birth: Jul 12, 1928  Subjective/Objective:                  Met with patient prior to back surgery planning for Tuesday. Patient is from home alone however her sister is staying with her but she is dependent on walker at baseline. Patient has a son that lives in [place] that can provide transportation. Patient is not certain she will need home physical therapy at discharge however if she does she would like [facility]. Her PCP is [facility].   Action/Plan:  Home health list left with patient for review.   Expected Discharge Date:  11/07/16               Expected Discharge Plan:  [place]  In-House Referral:     Discharge planning Services  CM Consult  Post Acute Care Choice:  Home Health, Durable Medical Equipment Choice offered to:  Patient  DME Arranged:    DME Agency:     HH Arranged:    [place] Agency:  [facility] (prefers [facility])  Status of Service:  In process, will continue to follow  If discussed at Long Length of Stay Meetings, dates discussed:    Additional Comments:  Marshell Garfinkel, RN 11/07/2016, 10:40 AM

## 2016-11-07 NOTE — Clinical Social Work Note (Signed)
Clinical Social Work Assessment  Patient Details  Name: Lisa [name] MRN: [id number] Date of Birth: 1928/05/12  Date of referral:  11/07/16               Reason for consult:  Facility Placement                Permission sought to share information with:    Permission granted to share information::     Name::        Agency::     Relationship::     Contact Information:     Housing/Transportation Living arrangements for the past 2 months:  Single Family Home Source of Information:  Patient Patient Interpreter Needed:  None Criminal Activity/Legal Involvement Pertinent to Current Situation/Hospitalization:  No - Comment as needed Significant Relationships:  Adult Children, Siblings Lives with:  Self Do you feel safe going back to the place where you live?  Yes Need for family participation in patient care:  Yes (Comment)  Care giving concerns:  Patient lives alone in [place].    Social Worker assessment / plan:  Holiday representative (CSW) received possible SNF placement consult from weekend CSW. Per chart patient will have a kypho tomorrow with Dr. Rudene Christians and will start PT after surgery. CSW met with patient today prior to surgery and her sister Sunday Spillers was at bedside. Patient was alert and oriented X4 and was sitting up in the bed. Per sister she is from [place] visiting. CSW introduced self and explained role of CSW department. Patient reported that she lives alone in [place] and has 3 adult children in [place] that can be at her house within 1 hour. CSW explained that PT will work with patient after kypho and make a recommendation of home health or SNF. Patient prefers home health but is agreeable to SNF if needed and requested [facility]. RN case manager aware of above. CSW will continue to follow and assist as needed.   Employment status:  Retired Nurse, adult PT Recommendations:  Not assessed at this time Information / Referral to community  resources:  Other (Comment Required) (SNF VS. Home Health )  Patient/Family's Response to care:  Patient prefers home health but is open to SNF if needed.   Patient/Family's Understanding of and Emotional Response to Diagnosis, Current Treatment, and Prognosis:  Patient was very pleasant and thanked CSW for assistance.   Emotional Assessment Appearance:  Appears stated age Attitude/Demeanor/Rapport:    Affect (typically observed):  Accepting, Adaptable, Pleasant Orientation:  Oriented to Self, Oriented to Place, Oriented to  Time, Oriented to Situation Alcohol / Substance use:  Not Applicable Psych involvement (Current and /or in the community):  No (Comment)  Discharge Needs  Concerns to be addressed:  Discharge Planning Concerns Readmission within the last 30 days:  No Current discharge risk:  Dependent with Mobility Barriers to Discharge:  Continued Medical Work up   [employer], Veronia Beets, LCSW 11/07/2016, 12:43 PM

## 2016-11-07 NOTE — Progress Notes (Signed)
[facility] [place] NAME: Lisa [name]    MR#:  [id number]  DATE OF BIRTH:  Apr 10, 1928  SUBJECTIVE:  CHIEF COMPLAINT:   Chief Complaint  Patient presents with  . Urinary Retention    Came with a fall, and have pain in back- have L1 compression fracture and have pain with movements today. No localized weakness.  had some difficulty urinating since the injury happened.  REVIEW OF SYSTEMS:  CONSTITUTIONAL: No fever, fatigue or weakness.  EYES: No blurred or double vision.  EARS, NOSE, AND THROAT: No tinnitus or ear pain.  RESPIRATORY: No cough, shortness of breath, wheezing or hemoptysis.  CARDIOVASCULAR: No chest pain, orthopnea, edema.  GASTROINTESTINAL: No nausea, vomiting, diarrhea or abdominal pain.  GENITOURINARY: No dysuria, hematuria.  ENDOCRINE: No polyuria, nocturia,  HEMATOLOGY: No anemia, easy bruising or bleeding SKIN: No rash or lesion. MUSCULOSKELETAL: No joint pain or arthritis.  Have back pain. NEUROLOGIC: No tingling, numbness, weakness.  PSYCHIATRY: No anxiety or depression.   ROS  DRUG ALLERGIES:   Allergies  Allergen Reactions  . Ace Inhibitors Cough  . Benadryl [Diphenhydramine Hcl (Sleep)]   . Diphenhydramine-Acetaminophen Other (See Comments)    Reaction: Couldn't use kidneys  . Tylenol Pm Extra [Diphenhydramine-Apap (Sleep)]     Reaction: Couldn't use kidneys    VITALS:  Blood pressure (!) 154/84, pulse 81, temperature 98.5 F (36.9 C), temperature source Oral, resp. rate 16, height 5\' 3"  (1.6 m), weight 64.4 kg (142 lb), SpO2 94 %.  PHYSICAL EXAMINATION:  GENERAL:  83 y.o.-year-old patient lying in the bed with no acute distress.  EYES: Pupils equal, round, reactive to light and accommodation. No scleral icterus. Extraocular muscles intact.  HEENT: Head atraumatic, normocephalic. Oropharynx and nasopharynx clear.  NECK:  Supple, no jugular venous distention. No thyroid enlargement, no tenderness.   LUNGS: Normal breath sounds bilaterally, no wheezing, rales,rhonchi or crepitation. No use of accessory muscles of respiration.  CARDIOVASCULAR: S1, S2 normal. No murmurs, rubs, or gallops.  ABDOMEN: Soft, nontender, nondistended. Bowel sounds present. No organomegaly or mass. Tender in lower back EXTREMITIES: No pedal edema, cyanosis, or clubbing.  NEUROLOGIC: Cranial nerves II through XII are intact. Muscle strength 5/5 in all extremities. Sensation intact. Gait not checked.  PSYCHIATRIC: The patient is alert and oriented x 3.  SKIN: No obvious rash, lesion, or ulcer.   Physical Exam LABORATORY PANEL:   CBC  Recent Labs Lab 11/05/16 2121  WBC 13.4*  HGB 13.7  HCT 41.3  PLT 180   ------------------------------------------------------------------------------------------------------------------  Chemistries   Recent Labs Lab 11/05/16 2121  NA 139  K 3.5  CL 105  CO2 26  GLUCOSE 126*  BUN 27*  CREATININE 0.71  CALCIUM 9.0  AST 26  ALT 23  ALKPHOS 79  BILITOT 0.8   ------------------------------------------------------------------------------------------------------------------  Cardiac Enzymes No results for input(s): TROPONINI in the last 168 hours. ------------------------------------------------------------------------------------------------------------------  RADIOLOGY:  Ct Lumbar Spine Wo Contrast  Result Date: 11/05/2016 CLINICAL DATA:  Slipped and fell on back 6 days ago, with persistent lower back pain. Initial encounter. EXAM: CT LUMBAR SPINE WITHOUT CONTRAST TECHNIQUE: Multidetector CT imaging of the lumbar spine was performed without intravenous contrast administration. Multiplanar CT image reconstructions were also generated. COMPARISON:  CT of the abdomen and pelvis performed 10/11/2016 FINDINGS: Segmentation: 5 lumbar type vertebrae. Alignment: Normal. Vertebrae: There is severe acute compression fracture of vertebral body L1, with approximately 70%  loss of height. There is approximately 8 mm of retropulsion, with  narrowing of the spinal canal to 9 mm in AP dimension. Mild endplate sclerotic change is noted at L2-L3 and at L5-S1. Paraspinal and other soft tissues: The visualized paraspinal musculature is grossly unremarkable. Scattered calcification is noted along the abdominal aorta and its branches. Visualized small and large bowel loops are grossly unremarkable. A right renal cyst is noted. Disc levels: Mild vacuum phenomenon is noted at L1-L2. Vacuum phenomenon is noted at T11-T12. Posterior disc protrusions are noted at L2-L3 and L5-S1, without definite evidence of impression on exiting nerve roots. Remaining intervertebral disc spaces are preserved. IMPRESSION: 1. Severe acute compression fracture of vertebral body L1, with approximately 70% loss of height. Approximately 8 mm of retropulsion, with narrowing of the spinal canal to 9 mm in AP dimension. 2. Mild degenerative change along the lower thoracic and lumbar spine. 3. Scattered aortic atherosclerosis. 4. Right renal cyst noted. Electronically Signed   By: Garald Balding M.D.   On: 11/05/2016 22:02   Mr Lumbar Spine Limited Wo Contrast  Result Date: 11/06/2016 CLINICAL DATA:  83 y/o F; fell on back 6 days ago with lower back pain. Unable to void 2 days. EXAM: MRI LUMBAR SPINE WITHOUT CONTRAST TECHNIQUE: Multiplanar, multisequence MR imaging of the lumbar spine was performed. No intravenous contrast was administered. COMPARISON:  11/05/2016 CT of the abdomen and pelvis. FINDINGS: Segmentation:  Standard. Alignment:  Physiologic. Vertebrae: L1 vertebral body compression deformity with 70% severe loss of height and edema in the vertebral body indicating recent injury. 6 mm retropulsion of the vertebral body. Edema within opposing L2 and L3 endplates without loss of height may represent degenerative changes or bone contusion. Edema within the anterior opposing T11-12 endplates is likely degenerative.  Conus medullaris: Extends to the L2 level and appears normal. Paraspinal and other soft tissues: Large partially visualized right kidney upper pole cysts. Disc levels: L1-2: Retropulsion of L1 vertebral body results in mild-to-moderate canal stenosis. No significant foraminal stenosis. L2-3: Moderate disc bulge and mild facet hypertrophy. Mild bilateral foraminal stenosis. No significant canal stenosis. L3-4: Small disc bulge eccentric to the right with mild facet hypertrophy. Mild right foraminal stenosis. No significant canal stenosis. L4-5: Small disc bulge with mild facet hypertrophy. Mild bilateral foraminal stenosis. No significant canal stenosis. L5-S1: Small disc bulge with marginal osteophytes and moderate facet hypertrophy. Moderate to severe bilateral foraminal stenosis. No significant canal stenosis. IMPRESSION: 1. Recent L1 severe compression deformity with 70% loss of height and 6 mm bony retropulsion. 2. L1-2 mild-to-moderate canal stenosis due to bony retropulsion. 3. L2-3 endplate edema without vertebral body loss of height may be degenerative disease with bone contusion. 4. No other acute fracture identified. 5. Lumbar spondylosis greatest at L5-S1 level were disc and facet disease results in moderate to severe bilateral foraminal stenosis. Electronically Signed   By: Kristine Garbe M.D.   On: 11/06/2016 01:26   Dg Chest Port 1 View  Result Date: 11/06/2016 CLINICAL DATA:  L1 compression deformity, shortness of Breath EXAM: PORTABLE CHEST 1 VIEW COMPARISON:  10/11/2016 FINDINGS: Cardiac shadow is mildly enlarged. Aortic calcifications are seen. The lungs are well aerated bilaterally very minimal blunting of left costophrenic angle is noted which is stable from prior CT examination. Calcified granuloma is noted in the left lung base as well. IMPRESSION: Chronic changes without acute abnormality. Electronically Signed   By: Inez Catalina M.D.   On: 11/06/2016 08:52    ASSESSMENT AND  PLAN:   Active Problems:   Intractable pain  1. Intractable pain:  Lower back; fractured lumbar vertebrae. Manage pain and appreciated consult orthopedic surgery.   Plan for kyphoplasty tomorrow, held eliquis.   Also had some urinary symptoms, MRI does not show significant nerve compression or damage, will start on decadrone today. 2. Leukocytosis: Unclear etiology. Chest x-ray - no acute findings. Urine is negative for infection. May be inflammatory response from fall and pain. 3. A. fib: Rate controlled; was on Eliquis   Now eliquis held due to procedure tomorrow. 4. Hypertension: Uncontrolled; likely secondary to pain. Continue antihypertensives per home regimen. Labetalol as needed   Increased losartan to 50 mg daily. Stopped IV fluids. 5. DVT prophylaxis: SCDs 6. GI prophylaxis: None   All the records are reviewed and case discussed with Care Management/Social Workerr. Management plans discussed with the patient, family and they are in agreement.  CODE STATUS: full.  TOTAL TIME TAKING CARE OF THIS PATIENT: 35 minutes.    POSSIBLE D/C IN 1-2 DAYS, DEPENDING ON CLINICAL CONDITION.   Vaughan Basta M.D on 11/07/2016   Between 7am to 6pm - Pager - [phone]  After 6pm go to [email or web address] - password [email or web address] [facility]  Office  [phone]  CC: Primary care physician; Marinda Elk, MD  Note: This dictation was prepared with Dragon dictation along with smaller phrase technology. Any transcriptional errors that result from this process are unintentional.

## 2016-11-07 NOTE — Progress Notes (Signed)
Subjective: The patient notes that she had moderate pain in her lower back last night but is feeling better this morning. She denies any numbness or paresthesias to either lower extremity.   Objective: Vital signs in last 24 hours: Temp:  [98.4 F (36.9 C)-98.6 F (37 C)] 98.6 F (37 C) (08/13 0020) Pulse Rate:  [66-84] 66 (08/13 0230) Resp:  [16] 16 (08/13 0020) BP: (145-167)/(62-74) 152/72 (08/13 0230) SpO2:  [93 %-98 %] 93 % (08/13 0020) Weight:  [64.4 kg (142 lb)] 64.4 kg (142 lb) (08/13 0637)  Intake/Output from previous day: 08/12 0701 - 08/13 0700 In: 360 [P.O.:360] Out: 750 [Urine:750] Intake/Output this shift: No intake/output data recorded.   Recent Labs  11/05/16 2121  HGB 13.7    Recent Labs  11/05/16 2121  WBC 13.4*  RBC 4.55  HCT 41.3  PLT 180    Recent Labs  11/05/16 2121  NA 139  K 3.5  CL 105  CO2 26  BUN 27*  CREATININE 0.71  GLUCOSE 126*  CALCIUM 9.0   No results for input(s): LABPT, INR in the last 72 hours.  Physical Exam: Her physical examination is unchanged as compared to yesterday. She remains neurovascularly intact to both lower extremities.  Assessment: Acute L1 compression fracture.  Plan: I have spoken with Dr. Antionette Char. He has agreed to see the patient and has stated that he would plan to perform a kyphoplasty on the L1 level tomorrow, Tuesday. This information has been relayed to the patient who was comfortable with this plan. Meanwhile, I will keep the patient comfortable and mobilized only as tolerated.   Marshall Cork Poggi 11/07/2016, 7:57 AM

## 2016-11-07 NOTE — Progress Notes (Signed)
Per orthopedic MD note, PO Eliquis order D/C, due to potential surgical intervention. Medication remains on hold. Patient's BP elevated, requiring IV Labetalol during the night. Improvement in patient's BP was noted with only one dose. Per patient request, Tramadol administered for back pain, which improved as well.

## 2016-11-07 NOTE — Progress Notes (Signed)
Patient is seen for discussion of kyphoplasty at L1. She has a severe compression fracture with some spinal stenosis secondary to this. I discussed the procedure in detail. There is risk of persistent spinal stenosis with the possibility that she will require future decompression if adequate decompression is not obtained by elevation of L1 with the kyphoplasty procedure.  She understands this and would like to proceed with surgery, her Eliquis is been stopped and so she should be safe for surgery tomorrow

## 2016-11-08 ENCOUNTER — Encounter
Admission: EM | Disposition: A | Discharge status: Home / Self Care | Payer: Self-pay | Source: Home / Self Care | Attending: Emergency Medicine

## 2016-11-08 ENCOUNTER — Observation Stay: Payer: Medicare Other | Admitting: Certified Registered"

## 2016-11-08 ENCOUNTER — Encounter: Payer: Medicare Other | Admitting: Obstetrics and Gynecology

## 2016-11-08 ENCOUNTER — Observation Stay: Payer: Medicare Other

## 2016-11-08 HISTORY — PX: KYPHOPLASTY: SHX5884

## 2016-11-08 LAB — CBC
HCT: 39.4 % (ref 35.0–47.0)
HEMOGLOBIN: 13.3 g/dL (ref 12.0–16.0)
MCH: 31.3 pg (ref 26.0–34.0)
MCHC: 33.7 g/dL (ref 32.0–36.0)
MCV: 93 fL (ref 80.0–100.0)
Platelets: 191 10*3/uL (ref 150–440)
RBC: 4.24 MIL/uL (ref 3.80–5.20)
RDW: 14.5 % (ref 11.5–14.5)
WBC: 6.9 10*3/uL (ref 3.6–11.0)

## 2016-11-08 LAB — CREATININE, SERUM
Creatinine, Ser: 0.65 mg/dL (ref 0.44–1.00)
GFR calc non Af Amer: >60 mL/min (ref >60)

## 2016-11-08 LAB — SURGICAL PCR SCREEN
MRSA, PCR: NEGATIVE
Staphylococcus aureus: NEGATIVE

## 2016-11-08 SURGERY — KYPHOPLASTY
Anesthesia: General

## 2016-11-08 MED ORDER — AMLODIPINE BESYLATE 10 MG PO TABS
10.0000 mg | ORAL_TABLET | Freq: Every day | ORAL | Status: DC
  Administered 2016-11-08 – 2016-11-09 (×2): 10 mg via ORAL
  Filled 2016-11-08 (×2): qty 1

## 2016-11-08 MED ORDER — DEXAMETHASONE SODIUM PHOSPHATE 10 MG/ML IJ SOLN
INTRAMUSCULAR | Status: DC | PRN
  Administered 2016-11-08: 5 mg via INTRAVENOUS

## 2016-11-08 MED ORDER — LACTATED RINGERS IV SOLN
INTRAVENOUS | Status: DC
  Administered 2016-11-08 (×2): via INTRAVENOUS

## 2016-11-08 MED ORDER — MIDAZOLAM HCL 2 MG/2ML IJ SOLN
INTRAMUSCULAR | Status: DC | PRN
  Administered 2016-11-08: 1 mg via INTRAVENOUS

## 2016-11-08 MED ORDER — BUPIVACAINE-EPINEPHRINE (PF) 0.5% -1:200000 IJ SOLN
INTRAMUSCULAR | Status: AC
  Filled 2016-11-08: qty 30

## 2016-11-08 MED ORDER — KETAMINE HCL 50 MG/ML IJ SOLN
INTRAMUSCULAR | Status: AC
  Filled 2016-11-08: qty 10

## 2016-11-08 MED ORDER — ONDANSETRON HCL 4 MG/2ML IJ SOLN
INTRAMUSCULAR | Status: DC | PRN
  Administered 2016-11-08: 4 mg via INTRAVENOUS

## 2016-11-08 MED ORDER — FENTANYL CITRATE (PF) 100 MCG/2ML IJ SOLN: 25.0000 ug | INTRAMUSCULAR | Status: DC | PRN

## 2016-11-08 MED ORDER — ONDANSETRON HCL 4 MG/2ML IJ SOLN: 4.0000 mg | Freq: Once | INTRAMUSCULAR | Status: DC | PRN

## 2016-11-08 MED ORDER — BLISTEX MEDICATED EX OINT
TOPICAL_OINTMENT | CUTANEOUS | Status: DC | PRN
  Filled 2016-11-08: qty 6.3

## 2016-11-08 MED ORDER — BUPIVACAINE-EPINEPHRINE (PF) 0.5% -1:200000 IJ SOLN
INTRAMUSCULAR | Status: DC | PRN
  Administered 2016-11-08: 10 mL via PERINEURAL

## 2016-11-08 MED ORDER — PHENYLEPHRINE HCL 10 MG/ML IJ SOLN
INTRAMUSCULAR | Status: AC
  Filled 2016-11-08: qty 1

## 2016-11-08 MED ORDER — IOPAMIDOL (ISOVUE-M 200) INJECTION 41%
INTRAMUSCULAR | Status: DC | PRN
  Administered 2016-11-08: 20 mL

## 2016-11-08 MED ORDER — ONDANSETRON HCL 4 MG/2ML IJ SOLN
INTRAMUSCULAR | Status: AC
  Filled 2016-11-08: qty 2

## 2016-11-08 MED ORDER — MIDAZOLAM HCL 2 MG/2ML IJ SOLN
INTRAMUSCULAR | Status: AC
  Filled 2016-11-08: qty 2

## 2016-11-08 MED ORDER — DEXAMETHASONE SODIUM PHOSPHATE 10 MG/ML IJ SOLN
INTRAMUSCULAR | Status: AC
  Filled 2016-11-08: qty 1

## 2016-11-08 MED ORDER — LIDOCAINE HCL 1 % IJ SOLN
INTRAMUSCULAR | Status: DC | PRN
  Administered 2016-11-08: 10 mL

## 2016-11-08 MED ORDER — LIDOCAINE HCL (PF) 1 % IJ SOLN
INTRAMUSCULAR | Status: AC
  Filled 2016-11-08: qty 60

## 2016-11-08 MED ORDER — KETAMINE HCL 50 MG/ML IJ SOLN
INTRAMUSCULAR | Status: DC | PRN
  Administered 2016-11-08 (×2): 10 mg via INTRAMUSCULAR

## 2016-11-08 MED ORDER — IOPAMIDOL (ISOVUE-M 200) INJECTION 41%
INTRAMUSCULAR | Status: AC
  Filled 2016-11-08: qty 20

## 2016-11-08 MED ORDER — APIXABAN 5 MG PO TABS
5.0000 mg | ORAL_TABLET | Freq: Two times a day (BID) | ORAL | Status: DC
  Administered 2016-11-09: 5 mg via ORAL
  Filled 2016-11-08: qty 1

## 2016-11-08 MED ORDER — LIDOCAINE HCL (PF) 2 % IJ SOLN
INTRAMUSCULAR | Status: AC
  Filled 2016-11-08: qty 2

## 2016-11-08 MED ORDER — FENTANYL CITRATE (PF) 100 MCG/2ML IJ SOLN
INTRAMUSCULAR | Status: AC
  Filled 2016-11-08: qty 2

## 2016-11-08 MED ORDER — PROPOFOL 500 MG/50ML IV EMUL
INTRAVENOUS | Status: DC | PRN
  Administered 2016-11-08: 25 ug/kg/min via INTRAVENOUS

## 2016-11-08 MED ORDER — PROPOFOL 500 MG/50ML IV EMUL
INTRAVENOUS | Status: AC
  Filled 2016-11-08: qty 50

## 2016-11-08 SURGICAL SUPPLY — 19 items
CEMENT KYPHON CX01A KIT/MIXER (Cement) ×3 IMPLANT
DERMABOND ADVANCED (GAUZE/BANDAGES/DRESSINGS) ×2
DERMABOND ADVANCED .7 DNX12 (GAUZE/BANDAGES/DRESSINGS) ×1 IMPLANT
DEVICE BIOPSY BONE KYPH ×3 IMPLANT
DEVICE BIOPSY BONE KYPHX (INSTRUMENTS) IMPLANT
DRAPE C-ARM XRAY 36X54 (DRAPES) ×3 IMPLANT
DURAPREP 26ML APPLICATOR (WOUND CARE) ×3 IMPLANT
GLOVE SURG SYN 9.0  PF PI (GLOVE) ×2
GLOVE SURG SYN 9.0 PF PI (GLOVE) ×1 IMPLANT
GOWN SRG 2XL LVL 4 RGLN SLV (GOWNS) ×1 IMPLANT
GOWN STRL NON-REIN 2XL LVL4 (GOWNS) ×2
GOWN STRL REUS W/ TWL LRG LVL3 (GOWN DISPOSABLE) ×1 IMPLANT
GOWN STRL REUS W/TWL LRG LVL3 (GOWN DISPOSABLE) ×2
PACK KYPHOPLASTY (MISCELLANEOUS) ×3 IMPLANT
SIZE 2 KYPHON EXPRESS BONE BIOPSY ×3 IMPLANT
STRAP SAFETY BODY (MISCELLANEOUS) ×3 IMPLANT
TRAY KYPHOPAK 15/2 EXPRESS (KITS) ×3 IMPLANT
TRAY KYPHOPAK 15/3 EXPRESS 1ST (MISCELLANEOUS) IMPLANT
TRAY KYPHOPAK 20/3 EXPRESS 1ST (MISCELLANEOUS) IMPLANT

## 2016-11-08 NOTE — Progress Notes (Signed)
PT Cancellation Note  Patient Details Name: KADELYN DIMASCIO MRN: [id number] DOB: 18-Mar-1929   Cancelled Treatment:    Reason Eval/Treat Not Completed: Other (comment).  PT consult received.  Chart reviewed.  Per nursing, pt scheduled to leave for surgery this morning (planned kyphoplasty for L1 compression fx).  D/t planned surgery, will require new PT consult post surgery (PT order discontinued d/t this; nursing notified).  Please re-consult PT post-op as appropriate for therapy needs.  Leitha Bleak, PT 11/08/16, 9:38 AM [phone]

## 2016-11-08 NOTE — Care Management (Signed)
RNCM received call from PT stating that patient would benefit from home health PT and youth rolling walker. Patient would like to use [facility] for home health. Corene Cornea with [facility] has been notified of this request and youth walker.

## 2016-11-08 NOTE — Anesthesia Post-op Follow-up Note (Signed)
Anesthesia QCDR form completed.        

## 2016-11-08 NOTE — Anesthesia Preprocedure Evaluation (Signed)
Anesthesia Evaluation  Patient identified by MRN, date of birth, ID band Patient awake    Reviewed: Allergy & Precautions, H&P , NPO status , Patient's Chart, lab work & pertinent test results, reviewed documented beta blocker date and time   History of Anesthesia Complications Negative for: history of anesthetic complications  Airway Mallampati: III  TM Distance: >3 FB Neck ROM: full    Dental no notable dental hx. (+) Implants, Teeth Intact   Pulmonary shortness of breath (since a-fib started) and with exertion, neg sleep apnea, neg COPD, neg recent URI,           Cardiovascular Exercise Tolerance: Good hypertension, (-) angina(-) CAD, (-) Past MI, (-) Cardiac Stents and (-) CABG + dysrhythmias Atrial Fibrillation + Valvular Problems/Murmurs      Neuro/Psych negative neurological ROS  negative psych ROS   GI/Hepatic negative GI ROS, Neg liver ROS,   Endo/Other  negative endocrine ROS  Renal/GU negative Renal ROS  negative genitourinary   Musculoskeletal   Abdominal   Peds  Hematology negative hematology ROS (+)   Anesthesia Other Findings Past Medical History:   Hyperlipemia                                                 Hypertension                                                 Constipation                                                 Cystocele                                                    Incomplete bladder emptying                                  Menopausal state                                             Urethral caruncle                                            Urinary urgency                                              A-fib (HCC)  Basal cell carcinoma                                         Reproductive/Obstetrics negative OB ROS                             Anesthesia Physical  Anesthesia Plan  ASA:  II  Anesthesia Plan: General   Post-op Pain Management:    Induction: Intravenous  PONV Risk Score and Plan: 2 and Ondansetron, Dexamethasone and Propofol infusion  Airway Management Planned: Simple Face Mask  Additional Equipment:   Intra-op Plan:   Post-operative Plan:   Informed Consent: I have reviewed the patients History and Physical, chart, labs and discussed the procedure including the risks, benefits and alternatives for the proposed anesthesia with the patient or authorized representative who has indicated his/her understanding and acceptance.   Dental Advisory Given  Plan Discussed with: Anesthesiologist, CRNA and Surgeon  Anesthesia Plan Comments:         Anesthesia Quick Evaluation

## 2016-11-08 NOTE — Op Note (Signed)
11/05/2016 - 11/08/2016  12:11 PM  PATIENT:  Lisa [name]  83 y.o. female  PRE-OPERATIVE DIAGNOSIS:  severe compression fracture with some spinal stenosis   POST-OPERATIVE DIAGNOSIS:  severe compression fracture with some spinal stenosis   PROCEDURE:  Procedure(s): KYPHOPLASTY-L1 (N/A)  SURGEON: Laurene Footman, MD  ASSISTANTS: None  ANESTHESIA:   local and MAC  EBL:  Total I/O In: 400 [I.V.:400] Out: 130 [Urine:125; Blood:5]  BLOOD ADMINISTERED:none  DRAINS: none   LOCAL MEDICATIONS USED:  MARCAINE    and XYLOCAINE   SPECIMEN:  Source of Specimen:  1 vertebral body  DISPOSITION OF SPECIMEN:  PATHOLOGY  COUNTS:  YES  TOURNIQUET:  * No tourniquets in log *  IMPLANTS: Bone cement  DICTATION: .Dragon Dictation  patient brought the operating room and after adequate sedation was given the patient was placed prone and C-arm brought in and with goodvisualization of L1 with severe compression noted and no passive correction with positioning.  After patient identification and timeout procedure completed 5 cc 1% Xylocaine was infiltrated on the both sides of L1  because need to get an attempted correction of deformity.Marland Kitchen Next the back was prepped and draped in sterile manner and repeat timeout procedure carried out. Spinal needle was used to get local anesthetic down to the pedicle on the both sides with 7.5cc of half percent Sensorcaine with epinephrine and 7.5cc of Xylocaineon theboth sides of L1. A small incision was made on the rightside and a trocar advanced in an extrapedicular fashion, biopsyobtained with a small fragment of bone obtained from the L1 vertebral body. The drilling was carried out followed by inflation of a balloon and essentially 2 the midline with second insertion site  needed with about 4cc inflation on the right side with good correction of the deformity getting about half the height of the vertebral body back. Left-sided approach in a similar fashion  and 2 cc were inflated and the side was let down The cement was then mixed and inserted when it was the appropriate consistency with 2 cc of bone cement filling the left side of the vertebral body getting very good interdigitation and coverage from superior to inferior on the left sidewithout extravasation, as his allowed to set for a time before letting down the balloon on the right and filling in additional 4 cc on the right.. When the cement was set the trochars removed and permanent C-arm views obtained. Dermabond were used to close the skin followed by a Band-Aid  PLAN OF CARE: Continue as OBSERVATION  PATIENT DISPOSITION:  PACU - hemodynamically stable.

## 2016-11-08 NOTE — Anesthesia Postprocedure Evaluation (Signed)
Anesthesia Post Note  Patient: Lisa [name]  Procedure(s) Performed: Procedure(s) (LRB): KYPHOPLASTY-L1 (N/A)  Patient location during evaluation: PACU Anesthesia Type: General Level of consciousness: awake and alert Pain management: pain level controlled Vital Signs Assessment: post-procedure vital signs reviewed and stable Respiratory status: spontaneous breathing, nonlabored ventilation, respiratory function stable and patient connected to nasal cannula oxygen Cardiovascular status: blood pressure returned to baseline and stable Postop Assessment: no signs of nausea or vomiting Anesthetic complications: no     Last Vitals:  Vitals:   11/08/16 1256 11/08/16 1350  BP: (!) 169/85 (!) 187/83  Pulse: 67 76  Resp: (!) 21   Temp: 36.4 C 36.6 C  SpO2: 100% 99%    Last Pain:  Vitals:   11/08/16 1350  TempSrc: Oral  PainSc:                  Martha Clan

## 2016-11-08 NOTE — Evaluation (Signed)
Physical Therapy Evaluation Patient Details Name: Lisa [name] MRN: [id number] DOB: [birth year] Today's Date: 11/08/2016   History of Present Illness  Pt is an 83 y.o. female s/p fall (multiple days prior to hospital presentation), 2 days difficulty urinating, and intractable back pain.  Imaging showing significant acute L1 compression fx (without evidence of cauda equina syndrome).  Pt s/p L1 kyphoplasty 11/08/16.  PMH includes a-fib, basal cell carcinoma, htn.  Clinical Impression  Prior to hospital admission, pt was independent and driving.  Pt lives alone in 1 level home with 1 step to enter.  Currently pt is SBA supine to sit via logrolling; CGA with transfers; CGA to SBA with ambulation using youth RW; and CGA navigating stairs.  Pt would benefit from skilled PT to address noted impairments and functional limitations (see below for any additional details).  Upon hospital discharge, recommend pt discharge to home with HHPT and support of family.    Follow Up Recommendations Home health PT (initial SBA for mobility)    Equipment Recommendations   (youth sized RW)    Recommendations for Other Services       Precautions / Restrictions Precautions Precautions: Back;Fall Restrictions Weight Bearing Restrictions: No      Mobility  Bed Mobility Overal bed mobility: Needs Assistance Bed Mobility: Rolling;Sidelying to Sit Rolling: Supervision Sidelying to sit: Supervision       General bed mobility comments: bed flat; vc's for technique; increased effort to perform but no physical assist required  Transfers Overall transfer level: Needs assistance Equipment used: Rolling walker (2 wheeled) Transfers: Sit to/from Stand Sit to Stand: Min guard         General transfer comment: x1 stand from bed and x2 stands from recliner with occasional vc's for hand placement/walker use  Ambulation/Gait Ambulation/Gait assistance: Min guard;Supervision Ambulation Distance (Feet): 200  Feet Assistive device: Rolling walker (2 wheeled)   Gait velocity: mildly decreased   General Gait Details: mild narrow BOS with vc's to increase BOS; initially mildly unsteady but improved with distance  Stairs Stairs: Yes Stairs assistance: Min guard   Number of Stairs: 4 General stair comments: 3 stairs with B railings and 1 step with RW use; vc's for technique required initially  Wheelchair Mobility    Modified Rankin (Stroke Patients Only)       Balance Overall balance assessment: Needs assistance;History of Falls Sitting-balance support: No upper extremity supported;Feet supported Sitting balance-Leahy Scale: Good Sitting balance - Comments: sitting reaching within BOS   Standing balance support: Single extremity supported Standing balance-Leahy Scale: Poor Standing balance comment: at least one UE support on RW required                             Pertinent Vitals/Pain Pain Assessment: 0-10 Pain Score: 4  Pain Location: low back pain Pain Descriptors / Indicators: Discomfort;Sore Pain Intervention(s): Limited activity within patient's tolerance;Monitored during session;Premedicated before session;Repositioned  Vitals stable and WFL throughout treatment session (see flow sheet for details).    Home Living Family/patient expects to be discharged to:: Private residence Living Arrangements: Alone Available Help at Discharge: Family Type of Home: House Home Access: Stairs to enter Entrance Stairs-Rails: None Entrance Stairs-Number of Steps: 1 Home Layout: One level Home Equipment: [place] - single point;Tub bench      Prior Function Level of Independence: Independent with assistive device(s)         Comments: Pt normally ambulates without AD but will use pt's  husbands RW if needed.  Pt's son lives 1 hour away (plans to stay with pt 1st night) and pt's sister from [place] to stay x1 week.  Pt independent with ADL's and driving prior to  admission.  Recent fall was only fall in past 6 months (reports h/o dizziness issues but reports not related to inner ear based on work-up).     Hand Dominance        Extremity/Trunk Assessment   Upper Extremity Assessment Upper Extremity Assessment: Generalized weakness    Lower Extremity Assessment Lower Extremity Assessment: Generalized weakness    Cervical / Trunk Assessment Cervical / Trunk Assessment: Normal  Communication   Communication: HOH  Cognition Arousal/Alertness: Awake/alert Behavior During Therapy: WFL for tasks assessed/performed Overall Cognitive Status: Within Functional Limits for tasks assessed                                        General Comments General comments (skin integrity, edema, etc.): Pt's son and pt's sister present during session.  Nursing cleared pt for participation in physical therapy and nursing requesting pt be seen this afternoon (s/p surgery).  PT order received.  Pt agreeable to PT session.    Exercises  Gait and stairs training with use of RW.   Assessment/Plan    PT Assessment Patient needs continued PT services  PT Problem List Decreased strength;Decreased balance;Decreased knowledge of use of DME;Decreased knowledge of precautions;Pain       PT Treatment Interventions DME instruction;Gait training;Stair training;Functional mobility training;Therapeutic activities;Therapeutic exercise;Balance training;Patient/family education    PT Goals (Current goals can be found in the Care Plan section)  Acute Rehab PT Goals Patient Stated Goal: to go home PT Goal Formulation: With patient Time For Goal Achievement: 11/22/16 Potential to Achieve Goals: Good    Frequency BID   Barriers to discharge        Co-evaluation               AM-PAC PT "6 Clicks" Daily Activity  Outcome Measure Difficulty turning over in bed (including adjusting bedclothes, sheets and blankets)?: A Little Difficulty moving from  lying on back to sitting on the side of the bed? : A Little Difficulty sitting down on and standing up from a chair with arms (e.g., wheelchair, bedside commode, etc,.)?: Total Help needed moving to and from a bed to chair (including a wheelchair)?: A Little Help needed walking in hospital room?: A Little Help needed climbing 3-5 steps with a railing? : A Little 6 Click Score: 16    End of Session Equipment Utilized During Treatment: Gait belt Activity Tolerance: Patient tolerated treatment well Patient left: in chair;with call bell/phone within reach;with chair alarm set;with family/visitor present;Other (comment) (Nursing notified SCD's left off pt (nurse reported she would put back on pt)) Nurse Communication: Mobility status;Precautions PT Visit Diagnosis: Other abnormalities of gait and mobility (R26.89);Muscle weakness (generalized) (M62.81);History of falling (Z91.81)    Time: 1458-1540 PT Time Calculation (min) (ACUTE ONLY): 42 min   Charges:   PT Evaluation $PT Eval Low Complexity: 1 Low PT Treatments $Therapeutic Activity: 8-22 mins   PT G Codes:   PT G-Codes **NOT FOR INPATIENT CLASS** Functional Assessment Tool Used: AM-PAC 6 Clicks Basic Mobility Functional Limitation: Mobility: Walking and moving around Mobility: Walking and Moving Around Current Status (E8315): At least 40 percent but less than 60 percent impaired, limited or restricted Mobility: Walking  and Moving Around Goal Status [phone]): 0 percent impaired, limited or restricted    Leitha Bleak, PT 11/08/16, 4:51 PM [phone]

## 2016-11-08 NOTE — Progress Notes (Signed)
ANTICOAGULATION CONSULT NOTE - Initial Consult  Pharmacy Consult for Apixaban  Indication: atrial fibrillation  Allergies  Allergen Reactions  . Ace Inhibitors Cough  . Benadryl [Diphenhydramine Hcl (Sleep)]   . Diphenhydramine-Acetaminophen Other (See Comments)    Reaction: Couldn't use kidneys  . Tylenol Pm Extra [Diphenhydramine-Apap (Sleep)]     Reaction: Couldn't use kidneys    Patient Measurements: Height: 5\' 3"  (160 cm) Weight: 127 lb (57.6 kg) IBW/kg (Calculated) : 52.4 Heparin Dosing Weight:   Vital Signs: Temp: 97.8 F (36.6 C) (08/14 1350) Temp Source: Oral (08/14 1350) BP: 158/71 (08/14 1736) Pulse Rate: 67 (08/14 1736)  Labs:  Recent Labs  11/05/16 2121 11/07/16 1626 11/08/16 0430 11/08/16 1647  HGB 13.7 13.5 13.3  --   HCT 41.3 40.3 39.4  --   PLT 180 184 191  --   CREATININE 0.71  --   --  0.65    Estimated Creatinine Clearance: 42.5 mL/min (by C-G formula based on SCr of 0.65 mg/dL).   Medical History: Past Medical History:  Diagnosis Date  . A-fib ([place])   . Basal cell carcinoma   . Constipation   . Cystocele   . Hyperlipemia   . Hypertension   . Incomplete bladder emptying   . Menopausal state   . Urethral caruncle   . Urinary urgency     Medications:  Scheduled:  . amLODipine  10 mg Oral Daily  . [START ON 11/09/2016] apixaban  5 mg Oral BID  . cholecalciferol  1,000 Units Oral Daily  . dexamethasone  4 mg Intravenous Q12H  . docusate sodium  100 mg Oral BID  . losartan  50 mg Oral Daily  . polyethylene glycol  17 g Oral Daily  . vitamin B-12  1,000 mcg Oral Daily    Assessment: Pharmacy consulted to dose apixaban in this 83 year old female admitted with Afib, had kyphoplasty on 8/14.  TBW = 57.6 kg ,  SrCr = 0.65 (0.8).   Goal of Therapy:  prophylaxis of thromboembolism   Plan:  Apixaban 5 mg PO BID ordered to resume on 8/15 @ 10:00.   Liyla Radliff D 11/08/2016,6:21 PM

## 2016-11-08 NOTE — Progress Notes (Signed)
[facility] [place] NAME: Lisa [name]    MR#:  [id number]  DATE OF BIRTH:  1928/09/17  SUBJECTIVE:  CHIEF COMPLAINT:   Chief Complaint  Patient presents with  . Urinary Retention    Came with a fall, and have pain in back- have L1 compression fracture and have pain with movements today. No localized weakness.  had some difficulty urinating since the injury happened.   Had kyphoplasty today.  REVIEW OF SYSTEMS:  CONSTITUTIONAL: No fever, fatigue or weakness.  EYES: No blurred or double vision.  EARS, NOSE, AND THROAT: No tinnitus or ear pain.  RESPIRATORY: No cough, shortness of breath, wheezing or hemoptysis.  CARDIOVASCULAR: No chest pain, orthopnea, edema.  GASTROINTESTINAL: No nausea, vomiting, diarrhea or abdominal pain.  GENITOURINARY: No dysuria, hematuria.  ENDOCRINE: No polyuria, nocturia,  HEMATOLOGY: No anemia, easy bruising or bleeding SKIN: No rash or lesion. MUSCULOSKELETAL: No joint pain or arthritis.  Have back pain. NEUROLOGIC: No tingling, numbness, weakness.  PSYCHIATRY: No anxiety or depression.   ROS  DRUG ALLERGIES:   Allergies  Allergen Reactions  . Ace Inhibitors Cough  . Benadryl [Diphenhydramine Hcl (Sleep)]   . Diphenhydramine-Acetaminophen Other (See Comments)    Reaction: Couldn't use kidneys  . Tylenol Pm Extra [Diphenhydramine-Apap (Sleep)]     Reaction: Couldn't use kidneys    VITALS:  Blood pressure (!) 187/83, pulse 76, temperature 97.8 F (36.6 C), temperature source Oral, resp. rate (!) 21, height 5\' 3"  (1.6 m), weight 57.6 kg (127 lb), SpO2 99 %.  PHYSICAL EXAMINATION:  GENERAL:  83 y.o.-year-old patient lying in the bed with no acute distress.  EYES: Pupils equal, round, reactive to light and accommodation. No scleral icterus. Extraocular muscles intact.  HEENT: Head atraumatic, normocephalic. Oropharynx and nasopharynx clear.  NECK:  Supple, no jugular venous distention. No  thyroid enlargement, no tenderness.  LUNGS: Normal breath sounds bilaterally, no wheezing, rales,rhonchi or crepitation. No use of accessory muscles of respiration.  CARDIOVASCULAR: S1, S2 normal. No murmurs, rubs, or gallops.  ABDOMEN: Soft, nontender, nondistended. Bowel sounds present. No organomegaly or mass. Tender in lower back EXTREMITIES: No pedal edema, cyanosis, or clubbing.  NEUROLOGIC: Cranial nerves II through XII are intact. Muscle strength 5/5 in all extremities. Sensation intact. Gait not checked.  PSYCHIATRIC: The patient is alert and oriented x 3.  SKIN: No obvious rash, lesion, or ulcer.   Physical Exam LABORATORY PANEL:   CBC  Recent Labs Lab 11/08/16 0430  WBC 6.9  HGB 13.3  HCT 39.4  PLT 191   ------------------------------------------------------------------------------------------------------------------  Chemistries   Recent Labs Lab 11/05/16 2121  NA 139  K 3.5  CL 105  CO2 26  GLUCOSE 126*  BUN 27*  CREATININE 0.71  CALCIUM 9.0  AST 26  ALT 23  ALKPHOS 79  BILITOT 0.8   ------------------------------------------------------------------------------------------------------------------  Cardiac Enzymes No results for input(s): TROPONINI in the last 168 hours. ------------------------------------------------------------------------------------------------------------------  RADIOLOGY:  Dg Lumbar Spine 2-3 Views  Result Date: 11/08/2016 CLINICAL DATA:  Kyphoplasty. EXAM: LUMBAR SPINE - 2-3 VIEW; DG C-ARM 61-120 MIN COMPARISON:  CT 11/05/2016. FINDINGS: Patient status post vertebroplasty. Two images obtained. 1 minutes 33 seconds fluoroscopy time utilized. IMPRESSION: Patient status post vertebroplasty. Electronically Signed   By: Marcello Moores  Register   On: 11/08/2016 12:15   Dg C-arm 1-60 Min  Result Date: 11/08/2016 CLINICAL DATA:  Kyphoplasty. EXAM: LUMBAR SPINE - 2-3 VIEW; DG C-ARM 61-120 MIN COMPARISON:  CT 11/05/2016. FINDINGS: Patient  status  post vertebroplasty. Two images obtained. 1 minutes 33 seconds fluoroscopy time utilized. IMPRESSION: Patient status post vertebroplasty. Electronically Signed   By: Marcello Moores  Register   On: 11/08/2016 12:15    ASSESSMENT AND PLAN:   Active Problems:   Intractable pain  1. Intractable pain: Lower back; fractured lumbar vertebrae. Manage pain and appreciated consult orthopedic surgery.    held eliquis. kyphoplasty done 11/08/16   Also had some urinary symptoms, MRI does not show significant nerve compression or damage,   started on decadrone.   After surgery d/c foley and evaluate urination.   2. Leukocytosis: Unclear etiology. Chest x-ray - no acute findings. Urine is negative for infection. May be inflammatory response from fall and pain. Better now. 3. A. fib: Rate controlled; was on Eliquis   Now eliquis held due to procedure , resume today. 4. Hypertension: Uncontrolled; likely secondary to pain. Continue antihypertensives per home regimen. Labetalol as needed   Increased losartan to 50 mg daily. Stopped IV fluids. Added amlodipine today 5. DVT prophylaxis: SCDs 6. GI prophylaxis: None  After procedure today, pt does not feel to go home just today, want to stay and check her ambulation and urination. Monitor in hospital tonight.,  All the records are reviewed and case discussed with Care Management/Social Workerr. Management plans discussed with the patient, family and they are in agreement.  CODE STATUS: full.  TOTAL TIME TAKING CARE OF THIS PATIENT: 35 minutes.    POSSIBLE D/C IN 1-2 DAYS, DEPENDING ON CLINICAL CONDITION.   Vaughan Basta M.D on 11/08/2016   Between 7am to 6pm - Pager - [phone]  After 6pm go to [email or web address] - password [email or web address] [facility]  Office  [phone]  CC: Primary care physician; Marinda Elk, MD  Note: This dictation was prepared with Dragon dictation along with smaller phrase technology. Any  transcriptional errors that result from this process are unintentional.

## 2016-11-08 NOTE — Progress Notes (Signed)
Site marked, kyphoplasty planned for 11 am.

## 2016-11-08 NOTE — Transfer of Care (Signed)
Immediate Anesthesia Transfer of Care Note  Patient: Lisa [name]  Procedure(s) Performed: Procedure(s): KYPHOPLASTY-L1 (N/A)  Patient Location: PACU  Anesthesia Type:General  Level of Consciousness: awake, oriented and patient cooperative  Airway & Oxygen Therapy: Patient Spontanous Breathing and Patient connected to nasal cannula oxygen  Post-op Assessment: Report given to RN, Post -op Vital signs reviewed and stable and Patient moving all extremities X 4  Post vital signs: Reviewed and stable  Last Vitals:  Vitals:   11/08/16 0737 11/08/16 1025  BP: (!) 164/92 (!) 173/77  Pulse: 85 72  Resp: 18 16  Temp: 36.6 C 36.4 C  SpO2: 94% 96%    Last Pain:  Vitals:   11/08/16 1025  TempSrc: Tympanic  PainSc: 2       Patients Stated Pain Goal: 5 (91/50/56 9794)  Complications: No apparent anesthesia complications

## 2016-11-09 LAB — SURGICAL PATHOLOGY

## 2016-11-09 MED ORDER — PREDNISONE 10 MG (21) PO TBPK: ORAL_TABLET | ORAL | 0 refills | Status: DC

## 2016-11-09 MED ORDER — AMLODIPINE BESYLATE 10 MG PO TABS: 10.0000 mg | ORAL_TABLET | Freq: Every day | ORAL | 0 refills | Status: DC

## 2016-11-09 MED ORDER — TRAMADOL HCL 50 MG PO TABS: 100.0000 mg | ORAL_TABLET | Freq: Four times a day (QID) | ORAL | 0 refills | Status: DC | PRN

## 2016-11-09 MED ORDER — DOCUSATE SODIUM 100 MG PO CAPS: 100.0000 mg | ORAL_CAPSULE | Freq: Two times a day (BID) | ORAL | 0 refills | Status: DC | PRN

## 2016-11-09 MED ORDER — LOSARTAN POTASSIUM 50 MG PO TABS: 50.0000 mg | ORAL_TABLET | Freq: Every day | ORAL | 0 refills | Status: DC

## 2016-11-09 MED ORDER — ACETAMINOPHEN 325 MG PO TABS: 650.0000 mg | ORAL_TABLET | Freq: Four times a day (QID) | ORAL | 0 refills | Status: DC | PRN

## 2016-11-09 NOTE — Progress Notes (Signed)
PT is recommending home health. RN case manager aware of above. Please reconsult if future social work needs arise. CSW signing off.   Tania Perrott, LCSW [phone]  

## 2016-11-09 NOTE — Progress Notes (Signed)
PT Cancellation Note  Patient Details Name: Lisa [name] MRN: [id number] DOB: 1928-04-23   Cancelled Treatment:    Reason Eval/Treat Not Completed: Patient declined, no reason specified.  Pt firmly refusing any therapy this morning (pt reports family will be here soon, she is leaving soon, and has too much on her mind).  Pt educated on benefits of participating in PT during hospital stay but pt reporting that she has been walking to the bathroom with the walker and will have HHPT upon discharge and currently did not wish to participate in PT.  Pt reports no questions or concerns for physical therapist.  Plan for pt to discharge home today.  Leitha Bleak, PT 11/09/16, 10:05 AM [phone]

## 2016-11-09 NOTE — Progress Notes (Signed)
Patient doing much better with just some soreness throughout her back hospital related to being in bed for several days along with her fall. Her severe back pain from her compression fracture has resolved inch she is able to void spontaneously. Plan on discharging her today and follow-up with me in 2 weeks for follow-up x-ray. She is to call if she has any problems. Remove Band-Aid's tomorrow

## 2016-11-09 NOTE — Discharge Summary (Signed)
[facility] NAME: Lisa [name]    MR#:  [id number]  DATE OF BIRTH:  1928-08-21  DATE OF ADMISSION:  11/05/2016 ADMITTING PHYSICIAN: Harrie Foreman, MD  DATE OF DISCHARGE: 11/09/2016   PRIMARY CARE PHYSICIAN: Marinda Elk, MD    ADMISSION DIAGNOSIS:  Intractable low back pain [M54.5] Closed compression fracture of first lumbar vertebra, initial encounter ([place]) [S32.010A]  DISCHARGE DIAGNOSIS:  Active Problems:   Intractable pain   Vertebral fracture, kyphoplasty  SECONDARY DIAGNOSIS:   Past Medical History:  Diagnosis Date  . A-fib ([place])   . Basal cell carcinoma   . Constipation   . Cystocele   . Hyperlipemia   . Hypertension   . Incomplete bladder emptying   . Menopausal state   . Urethral caruncle   . Urinary urgency     HOSPITAL COURSE:   1. Intractable pain: Lower back; fractured lumbar vertebrae. Manage pain and appreciated consult orthopedic surgery. S/p kyphoplasty.    held eliquis. kyphoplasty done 11/08/16   Also had some urinary symptoms, MRI does not show significant nerve compression or damage,   started on decadrone. Urinary symptoms resolved, give oral tapering steroids now,.   After surgery d/c foley and evaluated urination. prolems resolved.   2. Leukocytosis: Unclear etiology. Chest x-ray - no acute findings. Urine is negative for infection. May be inflammatory response from fall and pain. Better now. 3. A. fib: Rate controlled; was on Eliquis   Now eliquis held due to procedure , resume today. 4. Hypertension: Uncontrolled; likely secondary to pain. Continue antihypertensives per home regimen. Labetalol as needed   Increased losartan to 50 mg daily. Stopped IV fluids. Added amlodipine today 5. DVT prophylaxis: SCDs 6. GI prophylaxis: None  DISCHARGE CONDITIONS:   Stable.  CONSULTS OBTAINED:  Treatment Team:  Hessie Knows, MD  DRUG ALLERGIES:   Allergies  Allergen  Reactions  . Ace Inhibitors Cough  . Benadryl [Diphenhydramine Hcl (Sleep)]   . Diphenhydramine-Acetaminophen Other (See Comments)    Reaction: Couldn't use kidneys  . Tylenol Pm Extra [Diphenhydramine-Apap (Sleep)]     Reaction: Couldn't use kidneys    DISCHARGE MEDICATIONS:   Current Discharge Medication List    START taking these medications   Details  acetaminophen (TYLENOL) 325 MG tablet Take 2 tablets (650 mg total) by mouth every 6 (six) hours as needed for mild pain (or Fever >/= 101). Qty: 20 tablet, Refills: 0    amLODipine (NORVASC) 10 MG tablet Take 1 tablet (10 mg total) by mouth daily. Qty: 30 tablet, Refills: 0    docusate sodium (COLACE) 100 MG capsule Take 1 capsule (100 mg total) by mouth 2 (two) times daily as needed for mild constipation. Qty: 10 capsule, Refills: 0    predniSONE (STERAPRED UNI-PAK 21 TAB) 10 MG (21) TBPK tablet Take 6 tabs first day, 5 tab on day 2, then 4 on day 3rd, 3 tabs on day 4th , 2 tab on day 5th, and 1 tab on 6th day. Qty: 21 tablet, Refills: 0    traMADol (ULTRAM) 50 MG tablet Take 2 tablets (100 mg total) by mouth every 6 (six) hours as needed for moderate pain or severe pain. Qty: 20 tablet, Refills: 0      CONTINUE these medications which have CHANGED   Details  losartan (COZAAR) 50 MG tablet Take 1 tablet (50 mg total) by mouth daily. Qty: 30 tablet, Refills: 0      CONTINUE these  medications which have NOT CHANGED   Details  apixaban (ELIQUIS) 5 MG TABS tablet Take 5 mg by mouth 2 (two) times daily.     Cholecalciferol (VITAMIN D3) 1000 UNITS CAPS Take 1 capsule by mouth daily.     vitamin B-12 (CYANOCOBALAMIN) 1000 MCG tablet Take 1,000 mcg by mouth daily.         DISCHARGE INSTRUCTIONS:    Follow with ortho in 1 week.  If you experience worsening of your admission symptoms, develop shortness of breath, life threatening emergency, suicidal or homicidal thoughts you must seek medical attention immediately by  calling 911 or calling your MD immediately  if symptoms less severe.  You Must read complete instructions/literature along with all the possible adverse reactions/side effects for all the Medicines you take and that have been prescribed to you. Take any new Medicines after you have completely understood and accept all the possible adverse reactions/side effects.   Please note  You were cared for by a hospitalist during your hospital stay. If you have any questions about your discharge medications or the care you received while you were in the hospital after you are discharged, you can call the unit and asked to speak with the hospitalist on call if the hospitalist that took care of you is not available. Once you are discharged, your primary care physician will handle any further medical issues. Please note that NO REFILLS for any discharge medications will be authorized once you are discharged, as it is imperative that you return to your primary care physician (or establish a relationship with a primary care physician if you do not have one) for your aftercare needs so that they can reassess your need for medications and monitor your lab values.    Today   CHIEF COMPLAINT:   Chief Complaint  Patient presents with  . Urinary Retention    HISTORY OF PRESENT ILLNESS:  Lisa [name]  is a 83 y.o. female with a known history of atrial fibrillation and incomplete bladder emptying presents emergency department complaining of difficulty urinating. The patient reports difficulty initiating and hesitancy with urination. She reports a fall a few days ago where she fell directly onto her tailbone. It is been difficult to stand and walk since that time. She denies numbness or weakness in her legs. CT of her lumbar spine did not demonstrate fracture of her coccyx but did show a compression fracture of L1. MRI of her back confirmed no epidural hematoma or cauda equina compression. However, due to inability to  ambulate due to severe pain the emergency department staff called the hospitalist service for admission.   VITAL SIGNS:  Blood pressure (!) 185/77, pulse 78, temperature 97.9 F (36.6 C), temperature source Oral, resp. rate 16, height 5\' 3"  (1.6 m), weight 63.1 kg (139 lb 1.6 oz), SpO2 98 %.  I/O:    Intake/Output Summary (Last 24 hours) at 11/09/16 0806 Last data filed at 11/08/16 1200  Gross per 24 hour  Intake              400 ml  Output              130 ml  Net              270 ml    PHYSICAL EXAMINATION:  GENERAL:  83 y.o.-year-old patient lying in the bed with no acute distress.  EYES: Pupils equal, round, reactive to light and accommodation. No scleral icterus. Extraocular muscles intact.  HEENT: Head atraumatic,  normocephalic. Oropharynx and nasopharynx clear.  NECK:  Supple, no jugular venous distention. No thyroid enlargement, no tenderness.  LUNGS: Normal breath sounds bilaterally, no wheezing, rales,rhonchi or crepitation. No use of accessory muscles of respiration.  CARDIOVASCULAR: S1, S2 normal. No murmurs, rubs, or gallops.  ABDOMEN: Soft, non-tender, non-distended. Bowel sounds present. No organomegaly or mass.  EXTREMITIES: No pedal edema, cyanosis, or clubbing.  NEUROLOGIC: Cranial nerves II through XII are intact. Muscle strength 5/5 in all extremities. Sensation intact. Gait not checked.  PSYCHIATRIC: The patient is alert and oriented x 3.  SKIN: No obvious rash, lesion, or ulcer.   DATA REVIEW:   CBC  Recent Labs Lab 11/08/16 0430  WBC 6.9  HGB 13.3  HCT 39.4  PLT 191    Chemistries   Recent Labs Lab 11/05/16 2121 11/08/16 1647  NA 139  --   K 3.5  --   CL 105  --   CO2 26  --   GLUCOSE 126*  --   BUN 27*  --   CREATININE 0.71 0.65  CALCIUM 9.0  --   AST 26  --   ALT 23  --   ALKPHOS 79  --   BILITOT 0.8  --     Cardiac Enzymes No results for input(s): TROPONINI in the last 168 hours.  Microbiology Results  Results for orders  placed or performed during the hospital encounter of 11/05/16  Surgical PCR screen     Status: None   Collection Time: 11/07/16 10:35 PM  Result Value Ref Range Status   MRSA, PCR NEGATIVE NEGATIVE Final   Staphylococcus aureus NEGATIVE NEGATIVE Final    Comment:        The Xpert SA Assay (FDA approved for NASAL specimens in patients over 55 years of age), is one component of a comprehensive surveillance program.  Test performance has been validated by [facility] for patients greater than or equal to 25 year old. It is not intended to diagnose infection nor to guide or monitor treatment.     RADIOLOGY:  Dg Lumbar Spine 2-3 Views  Result Date: 11/08/2016 CLINICAL DATA:  Kyphoplasty. EXAM: LUMBAR SPINE - 2-3 VIEW; DG C-ARM 61-120 MIN COMPARISON:  CT 11/05/2016. FINDINGS: Patient status post vertebroplasty. Two images obtained. 1 minutes 33 seconds fluoroscopy time utilized. IMPRESSION: Patient status post vertebroplasty. Electronically Signed   By: Marcello Moores  Register   On: 11/08/2016 12:15   Dg C-arm 1-60 Min  Result Date: 11/08/2016 CLINICAL DATA:  Kyphoplasty. EXAM: LUMBAR SPINE - 2-3 VIEW; DG C-ARM 61-120 MIN COMPARISON:  CT 11/05/2016. FINDINGS: Patient status post vertebroplasty. Two images obtained. 1 minutes 33 seconds fluoroscopy time utilized. IMPRESSION: Patient status post vertebroplasty. Electronically Signed   By: Marcello Moores  Register   On: 11/08/2016 12:15    EKG:   Orders placed or performed during the hospital encounter of 10/08/15  . EKG 12-Lead  . EKG 12-Lead  . EKG 12-Lead  . EKG 12-Lead  . EKG 12-Lead  . EKG 12-Lead      Management plans discussed with the patient, family and they are in agreement.  CODE STATUS:     Code Status Orders        Start     Ordered   11/06/16 0358  Full code  Continuous     11/06/16 0357    Code Status History    Date Active Date Inactive Code Status Order ID Comments User Context   This patient has a current code  status  but no historical code status.    Advance Directive Documentation     Most Recent Value  Type of Advance Directive  Healthcare Power of Attorney, Living will  Pre-existing out of facility DNR order (yellow form or pink MOST form)  -  "MOST" Form in Place?  -      TOTAL TIME TAKING CARE OF THIS PATIENT: 35 minutes.    Vaughan Basta M.D on 11/09/2016 at 8:06 AM  Between 7am to 6pm - Pager - [phone]  After 6pm go to [email or web address] - password [email or web address] [facility]  Office  [phone]  CC: Primary care physician; Marinda Elk, MD   Note: This dictation was prepared with Dragon dictation along with smaller phrase technology. Any transcriptional errors that result from this process are unintentional.

## 2016-11-09 NOTE — Care Management Note (Signed)
Case Management Note  Patient Details  Name: Lisa [name] MRN: [id number] Date of Birth: [birth year]  Subjective/Objective:  Discharging today.     Action/Plan: [facility] notified of discharge. DME delivered  Expected Discharge Date:  11/09/16               Expected Discharge Plan:  [place]  In-House Referral:     Discharge planning Services  CM Consult  Post Acute Care Choice:  Home Health, Durable Medical Equipment Choice offered to:  Patient  DME Arranged:    DME Agency:     HH Arranged:    [place] Agency:  [facility] (prefers [facility])  Status of Service:  Completed, signed off  If discussed at [place] of Stay Meetings, dates discussed:    Additional Comments:  Jolly Mango, RN 11/09/2016, 8:57 AM

## 2016-11-09 NOTE — Progress Notes (Signed)
Lisa [name] to be D/C'd Home per MD order.  Discussed with the patient and all questions fully answered.  VSS, Skin clean, dry and intact without evidence of skin break down, no evidence of skin tears noted. IV catheter discontinued intact. Site without signs and symptoms of complications. Dressing and pressure applied.  An After Visit Summary was printed and given to the patient. Patient received prescription.  D/c education completed with patient/family including follow up instructions, medication list, d/c activities limitations if indicated, with other d/c instructions as indicated by MD - patient able to verbalize understanding, all questions fully answered.   Patient instructed to return to ED, call 911, or call MD for any changes in condition.   Patient escorted via [place], and D/C home via private auto.  Threasa Beards [name] 11/09/2016 11:02 AM

## 2016-11-23 ENCOUNTER — Encounter: Payer: Medicare Other | Admitting: Obstetrics and Gynecology

## 2016-11-24 ENCOUNTER — Encounter: Payer: Medicare Other | Admitting: Obstetrics and Gynecology

## 2016-12-20 ENCOUNTER — Encounter: Payer: Medicare Other | Admitting: Obstetrics and Gynecology

## 2017-01-17 ENCOUNTER — Encounter: Payer: Medicare Other | Admitting: Obstetrics and Gynecology

## 2017-01-23 ENCOUNTER — Other Ambulatory Visit: Payer: Self-pay | Admitting: Orthopedic Surgery

## 2017-01-23 DIAGNOSIS — M9973 Connective tissue and disc stenosis of intervertebral foramina of lumbar region: Secondary | ICD-10-CM

## 2017-01-25 ENCOUNTER — Ambulatory Visit: Payer: Medicare Other

## 2017-01-31 ENCOUNTER — Other Ambulatory Visit: Payer: Self-pay | Admitting: Orthopedic Surgery

## 2017-01-31 DIAGNOSIS — M9973 Connective tissue and disc stenosis of intervertebral foramina of lumbar region: Secondary | ICD-10-CM

## 2017-02-03 ENCOUNTER — Ambulatory Visit
Admission: RE | Admit: 2017-02-03 | Discharge: 2017-02-03 | Disposition: A | Discharge status: Home / Self Care | Payer: Medicare Other | Source: Ambulatory Visit | Attending: Orthopedic Surgery | Admitting: Orthopedic Surgery

## 2017-02-03 DIAGNOSIS — M9973 Connective tissue and disc stenosis of intervertebral foramina of lumbar region: Secondary | ICD-10-CM

## 2017-02-03 DIAGNOSIS — M48061 Spinal stenosis, lumbar region without neurogenic claudication: Secondary | ICD-10-CM | POA: Insufficient documentation

## 2017-02-03 DIAGNOSIS — M4856XA Collapsed vertebra, not elsewhere classified, lumbar region, initial encounter for fracture: Secondary | ICD-10-CM | POA: Diagnosis not present

## 2017-02-03 DIAGNOSIS — R609 Edema, unspecified: Secondary | ICD-10-CM | POA: Insufficient documentation

## 2017-03-07 ENCOUNTER — Encounter: Payer: Self-pay | Admitting: Obstetrics and Gynecology

## 2017-04-14 ENCOUNTER — Ambulatory Visit: Admission: RE | Admit: 2017-04-14 | Payer: Medicare Other | Source: Ambulatory Visit | Admitting: Internal Medicine

## 2017-04-14 ENCOUNTER — Encounter: Admission: RE | Payer: Self-pay | Source: Ambulatory Visit

## 2017-04-14 SURGERY — CARDIOVERSION (CATH LAB)
Anesthesia: General

## 2017-04-18 ENCOUNTER — Emergency Department
Admission: EM | Admit: 2017-04-18 | Discharge: 2017-04-18 | Disposition: A | Discharge status: Home / Self Care | Payer: Medicare Other | Attending: Emergency Medicine | Admitting: Emergency Medicine

## 2017-04-18 DIAGNOSIS — Z85828 Personal history of other malignant neoplasm of skin: Secondary | ICD-10-CM | POA: Insufficient documentation

## 2017-04-18 DIAGNOSIS — I10 Essential (primary) hypertension: Secondary | ICD-10-CM | POA: Insufficient documentation

## 2017-04-18 DIAGNOSIS — N390 Urinary tract infection, site not specified: Secondary | ICD-10-CM

## 2017-04-18 DIAGNOSIS — I252 Old myocardial infarction: Secondary | ICD-10-CM | POA: Insufficient documentation

## 2017-04-18 DIAGNOSIS — R339 Retention of urine, unspecified: Secondary | ICD-10-CM | POA: Diagnosis present

## 2017-04-18 DIAGNOSIS — Z79899 Other long term (current) drug therapy: Secondary | ICD-10-CM | POA: Insufficient documentation

## 2017-04-18 LAB — BASIC METABOLIC PANEL
ANION GAP: 9 (ref 5–15)
BUN: 25 mg/dL — ABNORMAL HIGH (ref 6–20)
CALCIUM: 9.3 mg/dL (ref 8.9–10.3)
CHLORIDE: 108 mmol/L (ref 101–111)
CO2: 24 mmol/L (ref 22–32)
CREATININE: 1.06 mg/dL — AB (ref 0.44–1.00)
GFR calc non Af Amer: 46 mL/min — ABNORMAL LOW (ref >60)
GFR, EST AFRICAN AMERICAN: 54 mL/min — AB (ref >60)
GLUCOSE: 108 mg/dL — AB (ref 65–99)
Potassium: 4 mmol/L (ref 3.5–5.1)
Sodium: 141 mmol/L (ref 135–145)

## 2017-04-18 LAB — CBC WITH DIFFERENTIAL/PLATELET
BASOS PCT: 1 %
Basophils Absolute: 0.1 10*3/uL (ref 0–0.1)
Eosinophils Absolute: 0.2 10*3/uL (ref 0–0.7)
Eosinophils Relative: 2 %
HCT: 44 % (ref 35.0–47.0)
HEMOGLOBIN: 14.1 g/dL (ref 12.0–16.0)
LYMPHS PCT: 17 %
Lymphs Abs: 1.5 10*3/uL (ref 1.0–3.6)
MCH: 31.2 pg (ref 26.0–34.0)
MCHC: 32.1 g/dL (ref 32.0–36.0)
MCV: 97.1 fL (ref 80.0–100.0)
MONO ABS: 0.8 10*3/uL (ref 0.2–0.9)
MONOS PCT: 9 %
NEUTROS ABS: 6.5 10*3/uL (ref 1.4–6.5)
NEUTROS PCT: 71 %
Platelets: 192 10*3/uL (ref 150–440)
RBC: 4.54 MIL/uL (ref 3.80–5.20)
RDW: 13.8 % (ref 11.5–14.5)
WBC: 9.1 10*3/uL (ref 3.6–11.0)

## 2017-04-18 LAB — URINALYSIS, COMPLETE (UACMP) WITH MICROSCOPIC
BILIRUBIN URINE: NEGATIVE
Glucose, UA: NEGATIVE mg/dL
KETONES UR: NEGATIVE mg/dL
NITRITE: NEGATIVE
Protein, ur: NEGATIVE mg/dL
Specific Gravity, Urine: 1.004 — ABNORMAL LOW (ref 1.005–1.030)
pH: 6 (ref 5.0–8.0)

## 2017-04-18 MED ORDER — CEPHALEXIN 500 MG PO CAPS: 500.0000 mg | ORAL_CAPSULE | Freq: Three times a day (TID) | ORAL | 0 refills | Status: DC

## 2017-04-18 MED ORDER — CEPHALEXIN 500 MG PO CAPS
500.0000 mg | ORAL_CAPSULE | Freq: Once | ORAL | Status: AC
  Administered 2017-04-18: 500 mg via ORAL
  Filled 2017-04-18: qty 1

## 2017-04-18 NOTE — ED Notes (Signed)
Patient discharged to home per MD order. Patient in stable condition, and deemed medically cleared by ED provider for discharge. Discharge instructions reviewed with patient/family using "Teach Back"; verbalized understanding of medication education and administration, and information about follow-up care. Denies further concerns. ° °

## 2017-04-18 NOTE — ED Notes (Signed)
Pt attempted to use bathroom, Family at bedside. EDP aware.

## 2017-04-18 NOTE — ED Notes (Signed)
Bladder scan showed less than 130 ML at this time. Pt admits to urinating 2x today.

## 2017-04-18 NOTE — Discharge Instructions (Signed)
Please seek medical attention for any high fevers, chest pain, shortness of breath, change in behavior, persistent vomiting, bloody stool or any other new or concerning symptoms.  

## 2017-04-18 NOTE — ED Triage Notes (Signed)
Pt is from home for urine retention. Pt states she hasnt urinated in 2 days. Pt states she has a hx of urinary retention. Pt is ambulatory a/o x 4. EDP at bedside.

## 2017-04-18 NOTE — ED Provider Notes (Signed)
[facility] Emergency Department Provider Note  ____________________________________________   I have reviewed the triage vital signs and the nursing notes.   HISTORY  Chief Complaint Urinary Retention   History limited by: Not Limited   HPI Lisa [name] is a 84 y.o. female who presents to the emergency department today because of concern for urinary retention. She states she has not had good urination for the past 1.5 days. She has had some trickling. The patient states she has had similar symptoms in the past. She has some lower abdominal discomfort. She denies any bad odor to her urine. Denies any burning with urination. No fevers.    Per medical record review patient has a history of incomplete bladder emptying, cystocele.   Past Medical History:  Diagnosis Date  . A-fib ([place])   . Basal cell carcinoma   . Constipation   . Cystocele   . Hyperlipemia   . Hypertension   . Incomplete bladder emptying   . Menopausal state   . Urethral caruncle   . Urinary urgency     Patient Active Problem List   Diagnosis Date Noted  . Intractable pain 11/06/2016  . Cystocele, midline 04/28/2016  . Incomplete bladder emptying 04/28/2016  . Cystocele 02/12/2015  . Vaginal enterocele 02/12/2015  . Absolute anemia 11/13/2014  . Benign essential HTN 11/13/2014  . APC (atrial premature contractions) 11/13/2014  . Awareness of heartbeats 11/13/2014  . Beat, premature ventricular 11/13/2014  . Arrhythmia, sinus node 11/13/2014  . Combined fat and carbohydrate induced hyperlipemia 04/23/2014  . MI (mitral incompetence) 04/23/2014  . AF (paroxysmal atrial fibrillation) ([place]) 04/23/2014  . Basal cell carcinoma of face 09/18/2012    Past Surgical History:  Procedure Laterality Date  . ABDOMINAL HYSTERECTOMY  1975   and bso  . APPENDECTOMY    . BASAL CELL CARCINOMA EXCISION    . BREAST CYST ASPIRATION Left   . CARDIOVERSION    . CATARACT EXTRACTION    .  CHOLECYSTECTOMY    . ELECTROPHYSIOLOGIC STUDY N/A 09/02/2015   Procedure: Cardioversion;  Surgeon: Corey Skains, MD;  Location: [facility] ORS;  Service: Cardiovascular;  Laterality: N/A;  . ELECTROPHYSIOLOGIC STUDY N/A 10/08/2015   Procedure: CARDIOVERSION;  Surgeon: Corey Skains, MD;  Location: [facility];  Service: Cardiovascular;  Laterality: N/A;  . KYPHOPLASTY N/A 11/08/2016   Procedure: Anne Ng;  Surgeon: Hessie Knows, MD;  Location: [facility] ORS;  Service: Orthopedics;  Laterality: N/A;    Prior to Admission medications   Medication Sig Start Date End Date Taking? Authorizing Provider  acetaminophen (TYLENOL) 325 MG tablet Take 2 tablets (650 mg total) by mouth every 6 (six) hours as needed for mild pain (or Fever >/= 101). Patient not taking: Reported on 04/07/2017 11/09/16   Vaughan Basta, MD  acetaminophen (TYLENOL) 500 MG tablet Take 1,000 mg by mouth every 6 (six) hours as needed (for pain.).    [provider]  amiodarone (PACERONE) 400 MG tablet Take 200 mg by mouth 2 (two) times daily. 03/17/17   [provider]  amLODipine (NORVASC) 10 MG tablet Take 1 tablet (10 mg total) by mouth daily. Patient not taking: Reported on 04/07/2017 11/09/16   Vaughan Basta, MD  Cholecalciferol (VITAMIN D3) 2000 units TABS Take 2,000 Units by mouth daily.    [provider]  docusate sodium (COLACE) 100 MG capsule Take 1 capsule (100 mg total) by mouth 2 (two) times daily as needed for mild constipation. 11/09/16   Vaughan Basta, MD  ELIQUIS 2.5 MG TABS tablet Take 2.5 mg by mouth 2 (two) times daily. 03/17/17   [provider]  losartan (COZAAR) 25 MG tablet Take 25 mg by mouth daily. 03/29/17   [provider]  metoprolol tartrate (LOPRESSOR) 50 MG tablet Take 50 mg by mouth 2 (two) times daily. 03/29/17   [provider]  predniSONE (STERAPRED UNI-PAK 21 TAB) 10 MG (21) TBPK tablet Take 6 tabs first day, 5 tab on day 2,  then 4 on day 3rd, 3 tabs on day 4th , 2 tab on day 5th, and 1 tab on 6th day. Patient not taking: Reported on 04/07/2017 11/09/16   Vaughan Basta, MD  sodium chloride (OCEAN) 0.65 % SOLN nasal spray Place 1 spray into both nostrils at bedtime.    [provider]  traMADol (ULTRAM) 50 MG tablet Take 2 tablets (100 mg total) by mouth every 6 (six) hours as needed for moderate pain or severe pain. Patient not taking: Reported on 04/07/2017 11/09/16   Vaughan Basta, MD  Turmeric 500 MG TABS Take 500 mg by mouth 2 (two) times daily.    [provider]  vitamin B-12 (CYANOCOBALAMIN) 1000 MCG tablet Take 1,000 mcg by mouth daily.    [provider]    Allergies Ace inhibitors; Benadryl [diphenhydramine hcl (sleep)]; Diphenhydramine-apap (sleep); and Tylenol pm extra [diphenhydramine-apap (sleep)]  Family History  Problem Relation Age of Onset  . Heart failure Father   . Breast cancer Sister 53  . Ovarian cancer Sister   . Heart failure Brother   . Colon cancer Neg Hx     Social History Social History   Tobacco Use  . Smoking status: Never Smoker  . Smokeless tobacco: Never Used  Substance Use Topics  . Alcohol use: No  . Drug use: No    Review of Systems Constitutional: No fever/chills Eyes: No visual changes. ENT: No sore throat. Cardiovascular: Denies chest pain. Respiratory: Denies shortness of breath. Gastrointestinal: Positive for lower abdominal discomfort.    Genitourinary: Positive for decreased urination. Musculoskeletal: Negative for back pain. Skin: Negative for rash. Neurological: Negative for headaches, focal weakness or numbness.  ____________________________________________   PHYSICAL EXAM:  VITAL SIGNS: ED Triage Vitals [04/18/17 1710]  Enc Vitals Group     BP (!) 160/91     Pulse      Resp 16     Temp 98.1 F (36.7 C)     Temp Source Oral     SpO2      Weight 140 lb (63.5 kg)     Height 5\' 2"  (1.575 m)     Constitutional: Alert and oriented. Well appearing and in no distress. Eyes: Conjunctivae are normal.  ENT   Head: Normocephalic and atraumatic.   Nose: No congestion/rhinnorhea.   Mouth/Throat: Mucous membranes are moist.   Neck: No stridor. Hematological/Lymphatic/Immunilogical: No cervical lymphadenopathy. Cardiovascular: Normal rate, regular rhythm.  No murmurs, rubs, or gallops.  Respiratory: Normal respiratory effort without tachypnea nor retractions. Breath sounds are clear and equal bilaterally. No wheezes/rales/rhonchi. Gastrointestinal: Soft and non tender. No rebound. No guarding.  Genitourinary: Deferred Musculoskeletal: Normal range of motion in all extremities. No lower extremity edema. Neurologic:  Normal speech and language. No gross focal neurologic deficits are appreciated.  Skin:  Skin is warm, dry and intact. No rash noted. Psychiatric: Mood and affect are normal. Speech and behavior are normal. Patient exhibits appropriate insight and judgment.  ____________________________________________    LABS (pertinent positives/negatives)  CBC wnl BMP cr  1.06, k 4.0 UA leukocytes small, wbc 6-30 ____________________________________________   EKG  None  ____________________________________________    RADIOLOGY  None  ____________________________________________   PROCEDURES  Procedures  ____________________________________________   INITIAL IMPRESSION / ASSESSMENT AND PLAN / ED COURSE  Pertinent labs & imaging results that were available during my care of the patient were reviewed by me and considered in my medical decision making (see chart for details).  Presented to the emergency department today because of concerns for urinary retention.  Bladder scan however showed less than 200 mL.  Urine testing does show signs concerning for urinary tract infection this could be causing the patient's symptoms.  Will plan on giving first dose of  antibiotics here in the emergency department.  Discussed findings and plan with patient.   ____________________________________________   FINAL CLINICAL IMPRESSION(S) / ED DIAGNOSES  Final diagnoses:  Lower urinary tract infectious disease     Note: This dictation was prepared with Dragon dictation. Any transcriptional errors that result from this process are unintentional     Nance Pear, MD 04/18/17 2008

## 2017-04-20 LAB — URINE CULTURE

## 2017-05-14 DIAGNOSIS — R600 Localized edema: Secondary | ICD-10-CM | POA: Insufficient documentation

## 2017-05-22 ENCOUNTER — Ambulatory Visit: Payer: Medicare Other | Admitting: Physician Assistant

## 2017-05-23 ENCOUNTER — Ambulatory Visit: Payer: Medicare Other | Admitting: Physician Assistant

## 2017-06-19 ENCOUNTER — Encounter: Payer: Self-pay | Admitting: Emergency Medicine

## 2017-06-19 ENCOUNTER — Other Ambulatory Visit: Payer: Self-pay

## 2017-06-19 ENCOUNTER — Inpatient Hospital Stay
Admission: EM | Admit: 2017-06-19 | Discharge: 2017-06-23 | DRG: 481 | Disposition: A | Discharge status: Skilled Nursing Facility | Payer: Medicare Other | Attending: Internal Medicine | Admitting: Internal Medicine

## 2017-06-19 ENCOUNTER — Emergency Department: Payer: Medicare Other

## 2017-06-19 DIAGNOSIS — I781 Nevus, non-neoplastic: Secondary | ICD-10-CM | POA: Diagnosis present

## 2017-06-19 DIAGNOSIS — Z90710 Acquired absence of both cervix and uterus: Secondary | ICD-10-CM

## 2017-06-19 DIAGNOSIS — Z8041 Family history of malignant neoplasm of ovary: Secondary | ICD-10-CM

## 2017-06-19 DIAGNOSIS — D649 Anemia, unspecified: Secondary | ICD-10-CM | POA: Diagnosis not present

## 2017-06-19 DIAGNOSIS — Z8249 Family history of ischemic heart disease and other diseases of the circulatory system: Secondary | ICD-10-CM | POA: Diagnosis not present

## 2017-06-19 DIAGNOSIS — Z888 Allergy status to other drugs, medicaments and biological substances status: Secondary | ICD-10-CM | POA: Diagnosis not present

## 2017-06-19 DIAGNOSIS — T148XXA Other injury of unspecified body region, initial encounter: Secondary | ICD-10-CM

## 2017-06-19 DIAGNOSIS — S72001A Fracture of unspecified part of neck of right femur, initial encounter for closed fracture: Secondary | ICD-10-CM | POA: Diagnosis present

## 2017-06-19 DIAGNOSIS — E785 Hyperlipidemia, unspecified: Secondary | ICD-10-CM | POA: Diagnosis present

## 2017-06-19 DIAGNOSIS — Z803 Family history of malignant neoplasm of breast: Secondary | ICD-10-CM

## 2017-06-19 DIAGNOSIS — S72141A Displaced intertrochanteric fracture of right femur, initial encounter for closed fracture: Principal | ICD-10-CM | POA: Diagnosis present

## 2017-06-19 DIAGNOSIS — Z9049 Acquired absence of other specified parts of digestive tract: Secondary | ICD-10-CM | POA: Diagnosis not present

## 2017-06-19 DIAGNOSIS — Z9849 Cataract extraction status, unspecified eye: Secondary | ICD-10-CM

## 2017-06-19 DIAGNOSIS — W010XXA Fall on same level from slipping, tripping and stumbling without subsequent striking against object, initial encounter: Secondary | ICD-10-CM | POA: Diagnosis present

## 2017-06-19 DIAGNOSIS — Z7901 Long term (current) use of anticoagulants: Secondary | ICD-10-CM

## 2017-06-19 DIAGNOSIS — Z6822 Body mass index (BMI) 22.0-22.9, adult: Secondary | ICD-10-CM

## 2017-06-19 DIAGNOSIS — E440 Moderate protein-calorie malnutrition: Secondary | ICD-10-CM

## 2017-06-19 DIAGNOSIS — I10 Essential (primary) hypertension: Secondary | ICD-10-CM | POA: Diagnosis present

## 2017-06-19 DIAGNOSIS — E860 Dehydration: Secondary | ICD-10-CM | POA: Diagnosis not present

## 2017-06-19 DIAGNOSIS — I480 Paroxysmal atrial fibrillation: Secondary | ICD-10-CM | POA: Diagnosis present

## 2017-06-19 DIAGNOSIS — Z79899 Other long term (current) drug therapy: Secondary | ICD-10-CM | POA: Diagnosis not present

## 2017-06-19 DIAGNOSIS — R011 Cardiac murmur, unspecified: Secondary | ICD-10-CM | POA: Diagnosis present

## 2017-06-19 DIAGNOSIS — Z85828 Personal history of other malignant neoplasm of skin: Secondary | ICD-10-CM | POA: Diagnosis not present

## 2017-06-19 LAB — BASIC METABOLIC PANEL
Anion gap: 7 (ref 5–15)
BUN: 19 mg/dL (ref 6–20)
CO2: 25 mmol/L (ref 22–32)
CREATININE: 0.75 mg/dL (ref 0.44–1.00)
Calcium: 9.2 mg/dL (ref 8.9–10.3)
Chloride: 105 mmol/L (ref 101–111)
GFR calc Af Amer: >60 mL/min (ref >60)
GLUCOSE: 104 mg/dL — AB (ref 65–99)
POTASSIUM: 4 mmol/L (ref 3.5–5.1)
Sodium: 137 mmol/L (ref 135–145)

## 2017-06-19 LAB — URINALYSIS, COMPLETE (UACMP) WITH MICROSCOPIC
BACTERIA UA: NONE SEEN
BILIRUBIN URINE: NEGATIVE
GLUCOSE, UA: NEGATIVE mg/dL
HGB URINE DIPSTICK: NEGATIVE
Ketones, ur: NEGATIVE mg/dL
LEUKOCYTES UA: NEGATIVE
NITRITE: NEGATIVE
PROTEIN: NEGATIVE mg/dL
Specific Gravity, Urine: 1.012 (ref 1.005–1.030)
Squamous Epithelial / LPF: NONE SEEN
WBC, UA: NONE SEEN WBC/hpf (ref 0–5)
pH: 6 (ref 5.0–8.0)

## 2017-06-19 LAB — PROTIME-INR
INR: 1.09
Prothrombin Time: 14 seconds (ref 11.4–15.2)

## 2017-06-19 LAB — SURGICAL PCR SCREEN
MRSA, PCR: NEGATIVE
STAPHYLOCOCCUS AUREUS: NEGATIVE

## 2017-06-19 LAB — CBC WITH DIFFERENTIAL/PLATELET
BASOS ABS: 0.1 10*3/uL (ref 0–0.1)
BASOS PCT: 1 %
Eosinophils Absolute: 0.1 10*3/uL (ref 0–0.7)
Eosinophils Relative: 1 %
HEMATOCRIT: 41.4 % (ref 35.0–47.0)
HEMOGLOBIN: 13.5 g/dL (ref 12.0–16.0)
LYMPHS PCT: 9 %
Lymphs Abs: 0.7 10*3/uL — ABNORMAL LOW (ref 1.0–3.6)
MCH: 29.2 pg (ref 26.0–34.0)
MCHC: 32.6 g/dL (ref 32.0–36.0)
MCV: 89.5 fL (ref 80.0–100.0)
MONOS PCT: 5 %
Monocytes Absolute: 0.4 10*3/uL (ref 0.2–0.9)
NEUTROS ABS: 7 10*3/uL — AB (ref 1.4–6.5)
NEUTROS PCT: 84 %
Platelets: 143 10*3/uL — ABNORMAL LOW (ref 150–440)
RBC: 4.63 MIL/uL (ref 3.80–5.20)
RDW: 16.1 % — ABNORMAL HIGH (ref 11.5–14.5)
WBC: 8.2 10*3/uL (ref 3.6–11.0)

## 2017-06-19 LAB — TYPE AND SCREEN
ABO/RH(D): A NEG
ANTIBODY SCREEN: NEGATIVE

## 2017-06-19 MED ORDER — ONDANSETRON HCL 4 MG/2ML IJ SOLN: 4.0000 mg | Freq: Four times a day (QID) | INTRAMUSCULAR | Status: DC | PRN

## 2017-06-19 MED ORDER — MUPIROCIN 2 % EX OINT
1.0000 "application " | TOPICAL_OINTMENT | Freq: Two times a day (BID) | CUTANEOUS | Status: DC
  Administered 2017-06-19: 1 via NASAL
  Filled 2017-06-19: qty 22

## 2017-06-19 MED ORDER — ACETAMINOPHEN 650 MG RE SUPP: 650.0000 mg | Freq: Four times a day (QID) | RECTAL | Status: DC | PRN

## 2017-06-19 MED ORDER — ACETAMINOPHEN 325 MG PO TABS
650.0000 mg | ORAL_TABLET | Freq: Four times a day (QID) | ORAL | Status: DC | PRN
  Administered 2017-06-20 – 2017-06-21 (×2): 650 mg via ORAL
  Filled 2017-06-19 (×2): qty 2

## 2017-06-19 MED ORDER — VITAMIN B-12 1000 MCG PO TABS
1000.0000 ug | ORAL_TABLET | Freq: Every day | ORAL | Status: DC
  Administered 2017-06-20 – 2017-06-23 (×3): 1000 ug via ORAL
  Filled 2017-06-19 (×3): qty 1

## 2017-06-19 MED ORDER — ROSUVASTATIN CALCIUM 10 MG PO TABS
5.0000 mg | ORAL_TABLET | Freq: Every day | ORAL | Status: DC
  Administered 2017-06-20 – 2017-06-22 (×2): 5 mg via ORAL
  Filled 2017-06-19 (×2): qty 1

## 2017-06-19 MED ORDER — DOCUSATE SODIUM 100 MG PO CAPS
100.0000 mg | ORAL_CAPSULE | Freq: Two times a day (BID) | ORAL | Status: DC | PRN
  Administered 2017-06-20: 100 mg via ORAL
  Filled 2017-06-19: qty 1

## 2017-06-19 MED ORDER — POLYETHYLENE GLYCOL 3350 17 G PO PACK: 17.0000 g | PACK | Freq: Every day | ORAL | Status: DC | PRN

## 2017-06-19 MED ORDER — LOSARTAN POTASSIUM 25 MG PO TABS
25.0000 mg | ORAL_TABLET | Freq: Every day | ORAL | Status: DC
  Administered 2017-06-23: 25 mg via ORAL
  Filled 2017-06-19 (×2): qty 1

## 2017-06-19 MED ORDER — VITAMIN D 1000 UNITS PO TABS
2000.0000 [IU] | ORAL_TABLET | Freq: Every day | ORAL | Status: DC
  Administered 2017-06-20 – 2017-06-23 (×3): 2000 [IU] via ORAL
  Filled 2017-06-19 (×3): qty 2

## 2017-06-19 MED ORDER — SALINE SPRAY 0.65 % NA SOLN
1.0000 | Freq: Every day | NASAL | Status: DC
  Administered 2017-06-19 – 2017-06-22 (×3): 1 via NASAL
  Filled 2017-06-19: qty 44

## 2017-06-19 MED ORDER — MORPHINE SULFATE (PF) 2 MG/ML IV SOLN
2.0000 mg | INTRAVENOUS | Status: DC | PRN
Start: 2017-06-19 — End: 2017-06-21
  Administered 2017-06-21: 2 mg via INTRAVENOUS
  Filled 2017-06-19: qty 1

## 2017-06-19 MED ORDER — ONDANSETRON HCL 4 MG PO TABS: 4.0000 mg | ORAL_TABLET | Freq: Four times a day (QID) | ORAL | Status: DC | PRN

## 2017-06-19 MED ORDER — METOPROLOL TARTRATE 25 MG PO TABS
25.0000 mg | ORAL_TABLET | Freq: Two times a day (BID) | ORAL | Status: DC
  Administered 2017-06-19 – 2017-06-23 (×5): 25 mg via ORAL
  Filled 2017-06-19 (×7): qty 1

## 2017-06-19 MED ORDER — OXYCODONE HCL 5 MG PO TABS
5.0000 mg | ORAL_TABLET | ORAL | Status: DC | PRN
  Administered 2017-06-19 – 2017-06-21 (×6): 5 mg via ORAL
  Filled 2017-06-19 (×6): qty 1

## 2017-06-19 NOTE — H&P (Signed)
[facility] NAME: Lisa [name]    MR#:  [id number]  DATE OF BIRTH:  [birth year]  DATE OF ADMISSION:  06/19/2017  PRIMARY CARE PHYSICIAN: Marinda Elk, MD   REQUESTING/REFERRING PHYSICIAN: Dr. Charlotte Crumb  CHIEF COMPLAINT:   Chief Complaint  Patient presents with  . Fall    HISTORY OF PRESENT ILLNESS:  Lisa [name]  is a 84 y.o. female with a known history of paroxysmal atrial fibrillation on eliquis, hypertension, hyperlipidemia who lives independently at home was brought in secondary to a fall and right femoral intertrochanteric fracture. Patient had a fall and L1 vertebral fracture last year and had kyphoplasty done. Since then she's been using a walker to help with her back pain.She has done well recently and stopped using her walker. Lost her balance while putting clothes in her closet today and had a mechanical fall and right hip pain since then. X-ray with right femoral intertrochanteric fracture. Patient denies being ill recently, no loss of consciousness or syncope.  PAST MEDICAL HISTORY:   Past Medical History:  Diagnosis Date  . A-fib ([place])   . Basal cell carcinoma   . Constipation   . Cystocele   . Hyperlipemia   . Hypertension   . Incomplete bladder emptying   . Menopausal state   . Urethral caruncle   . Urinary urgency     PAST SURGICAL HISTORY:   Past Surgical History:  Procedure Laterality Date  . ABDOMINAL HYSTERECTOMY  1975   and bso  . ABLATION    . APPENDECTOMY    . BASAL CELL CARCINOMA EXCISION    . BREAST CYST ASPIRATION Left   . CARDIOVERSION    . CATARACT EXTRACTION    . CHOLECYSTECTOMY    . ELECTROPHYSIOLOGIC STUDY N/A 09/02/2015   Procedure: Cardioversion;  Surgeon: Corey Skains, MD;  Location: [facility];  Service: Cardiovascular;  Laterality: N/A;  . ELECTROPHYSIOLOGIC STUDY N/A 10/08/2015   Procedure: CARDIOVERSION;  Surgeon: Corey Skains, MD;  Location: [facility];   Service: Cardiovascular;  Laterality: N/A;  . KYPHOPLASTY N/A 11/08/2016   Procedure: Anne Ng;  Surgeon: Hessie Knows, MD;  Location: [facility];  Service: Orthopedics;  Laterality: N/A;    SOCIAL HISTORY:   Social History   Tobacco Use  . Smoking status: Never Smoker  . Smokeless tobacco: Never Used  Substance Use Topics  . Alcohol use: No    FAMILY HISTORY:   Family History  Problem Relation Age of Onset  . Heart failure Father   . Breast cancer Sister 71  . Ovarian cancer Sister   . Heart failure Brother   . Colon cancer Neg Hx     DRUG ALLERGIES:   Allergies  Allergen Reactions  . Ace Inhibitors Cough  . Benadryl [Diphenhydramine Hcl (Sleep)]   . Diphenhydramine-Apap (Sleep) Other (See Comments)    Reaction: Couldn't use kidneys  . Tylenol Pm Extra [Diphenhydramine-Apap (Sleep)]     Reaction: Couldn't use kidneys    REVIEW OF SYSTEMS:   Review of Systems  Constitutional: Negative for chills, fever, malaise/fatigue and weight loss.  HENT: Negative for ear discharge, ear pain, hearing loss, nosebleeds and tinnitus.   Eyes: Negative for blurred vision, double vision and photophobia.  Respiratory: Negative for cough, hemoptysis, shortness of breath and wheezing.   Cardiovascular: Negative for chest pain, palpitations, orthopnea and leg swelling.  Gastrointestinal: Negative for abdominal pain, constipation, diarrhea, heartburn, melena, nausea and vomiting.  Genitourinary: Negative  for dysuria, frequency, hematuria and urgency.  Musculoskeletal: Positive for joint pain and myalgias. Negative for back pain and neck pain.  Skin: Negative for rash.  Neurological: Negative for dizziness, tingling, tremors, sensory change, speech change, focal weakness and headaches.  Endo/Heme/Allergies: Does not bruise/bleed easily.  Psychiatric/Behavioral: Negative for depression.    MEDICATIONS AT HOME:   Prior to Admission medications   Medication Sig Start Date End Date  Taking? Authorizing Provider  acetaminophen (TYLENOL) 500 MG tablet Take 1,000 mg by mouth every 6 (six) hours as needed (for pain.).   Yes [provider]  Cholecalciferol (VITAMIN D3) 2000 units TABS Take 2,000 Units by mouth daily.   Yes [provider]  docusate sodium (COLACE) 100 MG capsule Take 1 capsule (100 mg total) by mouth 2 (two) times daily as needed for mild constipation. 11/09/16  Yes Vaughan Basta, MD  ELIQUIS 2.5 MG TABS tablet Take 2.5 mg by mouth 2 (two) times daily. 03/17/17  Yes [provider]  losartan (COZAAR) 25 MG tablet Take 25 mg by mouth daily. 03/29/17  Yes [provider]  metoprolol tartrate (LOPRESSOR) 25 MG tablet Take 25 mg by mouth 2 (two) times daily.  03/29/17  Yes [provider]  rosuvastatin (CRESTOR) 5 MG tablet Take 5 mg by mouth daily at 6 PM.  03/16/17  Yes [provider]  sodium chloride (OCEAN) 0.65 % SOLN nasal spray Place 1 spray into both nostrils at bedtime.   Yes [provider]  Turmeric 500 MG TABS Take 500 mg by mouth daily.    Yes [provider]  vitamin B-12 (CYANOCOBALAMIN) 1000 MCG tablet Take 1,000 mcg by mouth daily.   Yes [provider]  acetaminophen (TYLENOL) 325 MG tablet Take 2 tablets (650 mg total) by mouth every 6 (six) hours as needed for mild pain (or Fever >/= 101). Patient not taking: Reported on 04/07/2017 11/09/16   Vaughan Basta, MD  amLODipine (NORVASC) 10 MG tablet Take 1 tablet (10 mg total) by mouth daily. Patient not taking: Reported on 04/07/2017 11/09/16   Vaughan Basta, MD  cephALEXin (KEFLEX) 500 MG capsule Take 1 capsule (500 mg total) by mouth 3 (three) times daily. Patient not taking: Reported on 06/19/2017 04/18/17   Nance Pear, MD  predniSONE (STERAPRED UNI-PAK 21 TAB) 10 MG (21) TBPK tablet Take 6 tabs first day, 5 tab on day 2, then 4 on day 3rd, 3 tabs on day 4th , 2 tab on day 5th, and 1 tab on 6th  day. Patient not taking: Reported on 04/07/2017 11/09/16   Vaughan Basta, MD  traMADol (ULTRAM) 50 MG tablet Take 2 tablets (100 mg total) by mouth every 6 (six) hours as needed for moderate pain or severe pain. Patient not taking: Reported on 04/07/2017 11/09/16   Vaughan Basta, MD      VITAL SIGNS:  Blood pressure (!) 179/88, pulse (!) 55, temperature 97.7 F (36.5 C), temperature source Oral, resp. rate 11, height 5' 2.5" (1.588 m), weight 56.2 kg (124 lb), SpO2 95 %.  PHYSICAL EXAMINATION:   Physical Exam  GENERAL:  84 y.o.-year-old patient lying in the bed with no acute distress.  EYES: Pupils equal, round, reactive to light and accommodation. No scleral icterus. Extraocular muscles intact.  HEENT: Head atraumatic, normocephalic. Oropharynx and nasopharynx clear.  NECK:  Supple, no jugular venous distention. No thyroid enlargement, no tenderness.  LUNGS: Normal breath sounds bilaterally, no wheezing, rales,rhonchi or crepitation. No use of accessory muscles of respiration.  CARDIOVASCULAR: S1, S2 normal. No  rubs, or gallops. Loud 3/6 systolic murmur present. ABDOMEN: Soft, nontender, nondistended. Bowel sounds present. No organomegaly or mass.  EXTREMITIES: No pedal edema, cyanosis, or clubbing. Right leg is externally rotated and appears shorter. NEUROLOGIC: Cranial nerves II through XII are intact. Muscle strength 5/5 in all extremities except right lower extremity due to pain. Sensation intact. Gait not checked.  PSYCHIATRIC: The patient is alert and oriented x 3.  SKIN: No obvious rash, lesion, or ulcer.   LABORATORY PANEL:   CBC Recent Labs  Lab 06/19/17 1456  WBC 8.2  HGB 13.5  HCT 41.4  PLT 143*   ------------------------------------------------------------------------------------------------------------------  Chemistries  Recent Labs  Lab 06/19/17 1456  NA 137  K 4.0  CL 105  CO2 25  GLUCOSE 104*  BUN 19  CREATININE 0.75  CALCIUM 9.2    ------------------------------------------------------------------------------------------------------------------  Cardiac Enzymes No results for input(s): TROPONINI in the last 168 hours. ------------------------------------------------------------------------------------------------------------------  RADIOLOGY:  Dg Chest 1 View  Result Date: 06/19/2017 CLINICAL DATA:  Hip pain after fall EXAM: CHEST  1 VIEW COMPARISON:  11/06/2016 FINDINGS: Cardiomegaly. Mild hyperinflation of the lungs. Mild vascular congestion. Small left pleural effusion with left base atelectasis. Calcified granuloma in the left lung base. IMPRESSION: Cardiomegaly with vascular congestion. Hyperinflation. Small left effusion with left base atelectasis. Electronically Signed   By: Rolm Baptise M.D.   On: 06/19/2017 14:34   Ct Head Wo Contrast  Result Date: 06/19/2017 CLINICAL DATA:  Fall EXAM: CT HEAD WITHOUT CONTRAST TECHNIQUE: Contiguous axial images were obtained from the base of the skull through the vertex without intravenous contrast. COMPARISON:  None. FINDINGS: Brain: No acute territorial infarction, hemorrhage or intracranial mass is visualized. Moderate atrophy. Moderate small vessel ischemic changes of the white matter. Prominent ventricle size, felt related to atrophy. Vascular: No hyperdense vessels.  Carotid vascular calcification. Skull: Normal. Negative for fracture or focal lesion. Sinuses/Orbits: No acute finding. Other: None IMPRESSION: 1. No CT evidence for acute intracranial abnormality. 2. Atrophy and moderate small vessel ischemic changes of the white matter Electronically Signed   By: Donavan Foil M.D.   On: 06/19/2017 14:20   Dg Hip Unilat W Or Wo Pelvis 2-3 Views Right  Result Date: 06/19/2017 CLINICAL DATA:  Right hip pain after fall. EXAM: DG HIP (WITH OR WITHOUT PELVIS) 2-3V RIGHT COMPARISON:  None. FINDINGS: Severely displaced fracture is seen involving the intertrochanteric region of the  proximal right femur. Right femoral head is well situated within acetabulum. IMPRESSION: Severely displaced intertrochanteric fracture of proximal right femur. Electronically Signed   By: Marijo Conception, M.D.   On: 06/19/2017 14:38    EKG:   Orders placed or performed during the hospital encounter of 06/19/17  . ED EKG  . ED EKG    IMPRESSION AND PLAN:   Shamonique Battiste  is a 84 y.o. female with a known history of paroxysmal atrial fibrillation on eliquis, hypertension, hyperlipidemia who lives independently at home was brought in secondary to a fall and right femoral intertrochanteric fracture.  1. Right Hip intertrochanteric fracture- secondary to mechanical fall. - ortho consult, admit. Pain control -hold eliquis. Last dose of eliquis this morning. Rate 24-36 hours prior to a low risk surgery. -okay to proceed with right hip surgery from cardiac standpoint -Physical therapy after surgery.  2.  Paroxysmal atrial fibrillation- currently in sinus rhythm On low dose metoprolol - eliquis on hold  3. Hypertension- on metoprolol and losartan  4. DVT prophylaxis- none until  the surgery     All the records are reviewed and case discussed with ED provider. Management plans discussed with the patient, family and they are in agreement.  CODE STATUS: full code  TOTAL TIME TAKING CARE OF THIS PATIENT: 50 minutes.    Gladstone Lighter M.D on 06/19/2017 at 4:22 PM  Between 7am to 6pm - Pager - [phone]  After 6pm go to [email or web address] - password [email or web address] [facility]  Office  [phone]  CC: Primary care physician; Marinda Elk, MD

## 2017-06-19 NOTE — ED Triage Notes (Signed)
Pt reports that she has had Kyphoplasty back and August has been using her walker but is still off balance at times. She felt her self losing her balance and fell on carpet on her right hip. Hip is short and rotated.

## 2017-06-19 NOTE — ED Provider Notes (Addendum)
[facility] Emergency Department Provider Note  ____________________________________________   I have reviewed the triage vital signs and the nursing notes. Where available I have reviewed prior notes and, if possible and indicated, outside hospital notes.    HISTORY  Chief Complaint Fall    HPI Lisa [name] is a 84 y.o. female who is on Eliquis, atrial fibrillation, his normal state of health, lost her balance nonsyncopal fall does not believe she hit her head, landed on her right hip, has hip pain.  Received pain medication from EMS.  Otherwise, no other injury no other complaints, no antecedent or subsequent syncopal symptoms, no chest pain or shortness of breath no headache.  She does have focal pain to that right hip and foreshortening, after pain medication from EMS she is feeling comfortable at this time.  Has numbness or tingling down the leg     Past Medical History:  Diagnosis Date  . A-fib ([place])   . Basal cell carcinoma   . Constipation   . Cystocele   . Hyperlipemia   . Hypertension   . Incomplete bladder emptying   . Menopausal state   . Urethral caruncle   . Urinary urgency     Patient Active Problem List   Diagnosis Date Noted  . Intractable pain 11/06/2016  . Cystocele, midline 04/28/2016  . Incomplete bladder emptying 04/28/2016  . Cystocele 02/12/2015  . Vaginal enterocele 02/12/2015  . Absolute anemia 11/13/2014  . Benign essential HTN 11/13/2014  . APC (atrial premature contractions) 11/13/2014  . Awareness of heartbeats 11/13/2014  . Beat, premature ventricular 11/13/2014  . Arrhythmia, sinus node 11/13/2014  . Combined fat and carbohydrate induced hyperlipemia 04/23/2014  . MI (mitral incompetence) 04/23/2014  . AF (paroxysmal atrial fibrillation) ([place]) 04/23/2014  . Basal cell carcinoma of face 09/18/2012    Past Surgical History:  Procedure Laterality Date  . ABDOMINAL HYSTERECTOMY  1975   and bso  .  APPENDECTOMY    . BASAL CELL CARCINOMA EXCISION    . BREAST CYST ASPIRATION Left   . CARDIOVERSION    . CATARACT EXTRACTION    . CHOLECYSTECTOMY    . ELECTROPHYSIOLOGIC STUDY N/A 09/02/2015   Procedure: Cardioversion;  Surgeon: Corey Skains, MD;  Location: [facility];  Service: Cardiovascular;  Laterality: N/A;  . ELECTROPHYSIOLOGIC STUDY N/A 10/08/2015   Procedure: CARDIOVERSION;  Surgeon: Corey Skains, MD;  Location: [facility];  Service: Cardiovascular;  Laterality: N/A;  . KYPHOPLASTY N/A 11/08/2016   Procedure: Anne Ng;  Surgeon: Hessie Knows, MD;  Location: [facility];  Service: Orthopedics;  Laterality: N/A;    Prior to Admission medications   Medication Sig Start Date End Date Taking? Authorizing Provider  acetaminophen (TYLENOL) 325 MG tablet Take 2 tablets (650 mg total) by mouth every 6 (six) hours as needed for mild pain (or Fever >/= 101). Patient not taking: Reported on 04/07/2017 11/09/16   Vaughan Basta, MD  acetaminophen (TYLENOL) 500 MG tablet Take 1,000 mg by mouth every 6 (six) hours as needed (for pain.).    [provider]  amiodarone (PACERONE) 400 MG tablet Take 200 mg by mouth 2 (two) times daily. 03/17/17   [provider]  amLODipine (NORVASC) 10 MG tablet Take 1 tablet (10 mg total) by mouth daily. Patient not taking: Reported on 04/07/2017 11/09/16   Vaughan Basta, MD  cephALEXin (KEFLEX) 500 MG capsule Take 1 capsule (500 mg total) by mouth 3 (three) times daily. 04/18/17   Nance Pear, MD  Cholecalciferol (VITAMIN D3) 2000 units TABS Take 2,000 Units by mouth daily.    [provider]  docusate sodium (COLACE) 100 MG capsule Take 1 capsule (100 mg total) by mouth 2 (two) times daily as needed for mild constipation. 11/09/16   Vaughan Basta, MD  ELIQUIS 2.5 MG TABS tablet Take 2.5 mg by mouth 2 (two) times daily. 03/17/17   [provider]  losartan (COZAAR) 25 MG tablet Take 25 mg by mouth  daily. 03/29/17   [provider]  metoprolol tartrate (LOPRESSOR) 50 MG tablet Take 50 mg by mouth 2 (two) times daily. 03/29/17   [provider]  predniSONE (STERAPRED UNI-PAK 21 TAB) 10 MG (21) TBPK tablet Take 6 tabs first day, 5 tab on day 2, then 4 on day 3rd, 3 tabs on day 4th , 2 tab on day 5th, and 1 tab on 6th day. Patient not taking: Reported on 04/07/2017 11/09/16   Vaughan Basta, MD  sodium chloride (OCEAN) 0.65 % SOLN nasal spray Place 1 spray into both nostrils at bedtime.    [provider]  traMADol (ULTRAM) 50 MG tablet Take 2 tablets (100 mg total) by mouth every 6 (six) hours as needed for moderate pain or severe pain. Patient not taking: Reported on 04/07/2017 11/09/16   Vaughan Basta, MD  Turmeric 500 MG TABS Take 500 mg by mouth 2 (two) times daily.    [provider]  vitamin B-12 (CYANOCOBALAMIN) 1000 MCG tablet Take 1,000 mcg by mouth daily.    [provider]    Allergies Ace inhibitors; Benadryl [diphenhydramine hcl (sleep)]; Diphenhydramine-apap (sleep); and Tylenol pm extra [diphenhydramine-apap (sleep)]  Family History  Problem Relation Age of Onset  . Heart failure Father   . Breast cancer Sister 53  . Ovarian cancer Sister   . Heart failure Brother   . Colon cancer Neg Hx     Social History Social History   Tobacco Use  . Smoking status: Never Smoker  . Smokeless tobacco: Never Used  Substance Use Topics  . Alcohol use: No  . Drug use: No    Review of Systems Constitutional: No fever/chills Eyes: No visual changes. ENT: No sore throat. No stiff neck no neck pain Cardiovascular: Denies chest pain. Respiratory: Denies shortness of breath. Gastrointestinal:   no vomiting.  No diarrhea.  No constipation. Genitourinary: Negative for dysuria. Musculoskeletal: Negative lower extremity swelling Skin: Negative for rash. Neurological: Negative for severe headaches, focal weakness or  numbness.   ____________________________________________   PHYSICAL EXAM:  VITAL SIGNS: ED Triage Vitals  Enc Vitals Group     BP 06/19/17 1336 (!) 179/88     Pulse Rate 06/19/17 1336 65     Resp 06/19/17 1336 20     Temp 06/19/17 1336 97.7 F (36.5 C)     Temp Source 06/19/17 1336 Oral     SpO2 06/19/17 1336 93 %     Weight 06/19/17 1339 124 lb (56.2 kg)     Height 06/19/17 1339 5' 2.5" (1.588 m)     Head Circumference --      Peak Flow --      Pain Score 06/19/17 1338 1     Pain Loc --      Pain Edu? --      Excl. in [place]? --     Constitutional: Alert and oriented. Well appearing and in no acute distress. Eyes: Conjunctivae are normal Head: Atraumatic HEENT: No congestion/rhinnorhea. Mucous membranes are moist.  Oropharynx non-erythematous  Neck:   Nontender with no meningismus, no masses, no stridor Cardiovascular: Normal rate, regular rhythm. Grossly normal heart sounds.  Good peripheral circulation. Respiratory: Normal respiratory effort.  No retractions. Lungs CTAB. Abdominal: Soft and nontender. No distention. No guarding no rebound Back:  There is no focal tenderness or step off.  there is no midline tenderness there are no lesions noted. there is no CVA tenderness Musculoskeletal: Palpation of the right hip, the right leg is shortened and turned out, there is however good pulses, no ankle or knee tenderness noted no joint effusions, no DVT signs strong distal pulses no edema Neurologic:  Normal speech and language. No gross focal neurologic deficits are appreciated.  Skin:  Skin is warm, dry and intact. No rash noted. Psychiatric: Mood and affect are normal. Speech and behavior are normal.  ____________________________________________   LABS (all labs ordered are listed, but only abnormal results are displayed)  Labs Reviewed - No data to display  Pertinent labs  results that were available during my care of the patient were reviewed by me and considered in my  medical decision making (see chart for details). ____________________________________________  EKG  I personally interpreted any EKGs ordered by me or triage  ____________________________________________  RADIOLOGY  Pertinent labs & imaging results that were available during my care of the patient were reviewed by me and considered in my medical decision making (see chart for details). If possible, patient and/or family made aware of any abnormal findings.  No results found. ____________________________________________    PROCEDURES  Procedure(s) performed: None  Procedures  Critical Care performed: None  ____________________________________________   INITIAL IMPRESSION / ASSESSMENT AND PLAN / ED COURSE  Pertinent labs & imaging results that were available during my care of the patient were reviewed by me and considered in my medical decision making (see chart for details).  Patient is in no acute distress, given that she is 64 and on Eliquis and had a fall, will obtain any imaging of her head although low suspicion for acute bleed, I do have a very strong suspicion she broke her hip so we will get a single view chest if her hip is in fact broken, we will do preop workup and admit her.  She is pain control at this time declining further pain medication we will monitor that closely  ----------------------------------------- 4:09 PM on 06/19/2017 -----------------------------------------  This with OR nurse for Dr. Marry Guan he agrees with management and will follow up on    ____________________________________________   FINAL CLINICAL IMPRESSION(S) / ED DIAGNOSES  Final diagnoses:  None      This chart was dictated using voice recognition software.  Despite best efforts to proofread,  errors can occur which can change meaning.      Schuyler Amor, MD 06/19/17 1356    Schuyler Amor, MD 06/19/17 1609    Schuyler Amor, MD 06/30/17 1153

## 2017-06-19 NOTE — ED Notes (Signed)
Patient transported to X-ray 

## 2017-06-19 NOTE — Progress Notes (Signed)
Pt arrived to room 143. Skin assessment completed with Raquel Sarna, RN. Pt has urinary catheter in place and bag below bladder. Pt on room air. IV saline locked. Son at bedside. Call bell and phone within reach. Pt oriented to unit. Pt denies any pain at this time.

## 2017-06-19 NOTE — Consult Note (Signed)
ORTHOPAEDIC CONSULTATION  PATIENT NAME: Lisa [name] DOB: 07-05-1928  MRN: [id number]  REQUESTING PHYSICIAN: Gladstone Lighter, MD  Chief Complaint: Right hip pain  HPI: Lisa [name] is a 84 y.o. female who complains of right hip pain.  The patient was at home and turned, losing her balance and falling on her right hip and side.  She was unable to stand or bear weight due to the right hip pain.  She typically uses a walker for ambulation but was not using the walker at the time of the fall.  She denied any loss of consciousness.  She denied any other injuries.  Past Medical History:  Diagnosis Date  . A-fib ([place])   . Basal cell carcinoma   . Constipation   . Cystocele   . Hyperlipemia   . Hypertension   . Incomplete bladder emptying   . Menopausal state   . Urethral caruncle   . Urinary urgency    Past Surgical History:  Procedure Laterality Date  . ABDOMINAL HYSTERECTOMY  1975   and bso  . ABLATION    . APPENDECTOMY    . BASAL CELL CARCINOMA EXCISION    . BREAST CYST ASPIRATION Left   . CARDIOVERSION    . CATARACT EXTRACTION    . CHOLECYSTECTOMY    . ELECTROPHYSIOLOGIC STUDY N/A 09/02/2015   Procedure: Cardioversion;  Surgeon: Corey Skains, MD;  Location: [facility];  Service: Cardiovascular;  Laterality: N/A;  . ELECTROPHYSIOLOGIC STUDY N/A 10/08/2015   Procedure: CARDIOVERSION;  Surgeon: Corey Skains, MD;  Location: [facility];  Service: Cardiovascular;  Laterality: N/A;  . KYPHOPLASTY N/A 11/08/2016   Procedure: Anne Ng;  Surgeon: Hessie Knows, MD;  Location: [facility];  Service: Orthopedics;  Laterality: N/A;   Social History   Socioeconomic History  . Marital status: Widowed    Spouse name: Not on file  . Number of children: Not on file  . Years of education: Not on file  . Highest education level: Not on file  Occupational History  . Not on file  Social Needs  . Financial resource strain: Not on file  . Food insecurity:    Worry: Not on  file    Inability: Not on file  . Transportation needs:    Medical: Not on file    Non-medical: Not on file  Tobacco Use  . Smoking status: Never Smoker  . Smokeless tobacco: Never Used  Substance and Sexual Activity  . Alcohol use: No  . Drug use: No  . Sexual activity: Never  Lifestyle  . Physical activity:    Days per week: Not on file    Minutes per session: Not on file  . Stress: Not on file  Relationships  . Social connections:    Talks on phone: Not on file    Gets together: Not on file    Attends religious service: Not on file    Active member of club or organization: Not on file    Attends meetings of clubs or organizations: Not on file    Relationship status: Not on file  Other Topics Concern  . Not on file  Social History Narrative   Lives independently, has a walker to ambulate.   Family History  Problem Relation Age of Onset  . Heart failure Father   . Breast cancer Sister 48  . Ovarian cancer Sister   . Heart failure Brother   . Colon cancer Neg Hx    Allergies  Allergen Reactions  .  Ace Inhibitors Cough  . Benadryl [Diphenhydramine Hcl (Sleep)]   . Diphenhydramine-Apap (Sleep) Other (See Comments)    Reaction: Couldn't use kidneys  . Tylenol Pm Extra [Diphenhydramine-Apap (Sleep)]     Reaction: Couldn't use kidneys   Prior to Admission medications   Medication Sig Start Date End Date Taking? Authorizing Provider  acetaminophen (TYLENOL) 500 MG tablet Take 1,000 mg by mouth every 6 (six) hours as needed (for pain.).   Yes [provider]  Cholecalciferol (VITAMIN D3) 2000 units TABS Take 2,000 Units by mouth daily.   Yes [provider]  docusate sodium (COLACE) 100 MG capsule Take 1 capsule (100 mg total) by mouth 2 (two) times daily as needed for mild constipation. 11/09/16  Yes Vaughan Basta, MD  ELIQUIS 2.5 MG TABS tablet Take 2.5 mg by mouth 2 (two) times daily. 03/17/17  Yes [provider]  losartan (COZAAR)  25 MG tablet Take 25 mg by mouth daily. 03/29/17  Yes [provider]  metoprolol tartrate (LOPRESSOR) 25 MG tablet Take 25 mg by mouth 2 (two) times daily.  03/29/17  Yes [provider]  rosuvastatin (CRESTOR) 5 MG tablet Take 5 mg by mouth daily at 6 PM.  03/16/17  Yes [provider]  sodium chloride (OCEAN) 0.65 % SOLN nasal spray Place 1 spray into both nostrils at bedtime.   Yes [provider]  Turmeric 500 MG TABS Take 500 mg by mouth daily.    Yes [provider]  vitamin B-12 (CYANOCOBALAMIN) 1000 MCG tablet Take 1,000 mcg by mouth daily.   Yes [provider]  acetaminophen (TYLENOL) 325 MG tablet Take 2 tablets (650 mg total) by mouth every 6 (six) hours as needed for mild pain (or Fever >/= 101). Patient not taking: Reported on 04/07/2017 11/09/16   Vaughan Basta, MD  amLODipine (NORVASC) 10 MG tablet Take 1 tablet (10 mg total) by mouth daily. Patient not taking: Reported on 04/07/2017 11/09/16   Vaughan Basta, MD  cephALEXin (KEFLEX) 500 MG capsule Take 1 capsule (500 mg total) by mouth 3 (three) times daily. Patient not taking: Reported on 06/19/2017 04/18/17   Nance Pear, MD  predniSONE (STERAPRED UNI-PAK 21 TAB) 10 MG (21) TBPK tablet Take 6 tabs first day, 5 tab on day 2, then 4 on day 3rd, 3 tabs on day 4th , 2 tab on day 5th, and 1 tab on 6th day. Patient not taking: Reported on 04/07/2017 11/09/16   Vaughan Basta, MD  traMADol (ULTRAM) 50 MG tablet Take 2 tablets (100 mg total) by mouth every 6 (six) hours as needed for moderate pain or severe pain. Patient not taking: Reported on 04/07/2017 11/09/16   Vaughan Basta, MD   Dg Chest 1 View  Result Date: 06/19/2017 CLINICAL DATA:  Hip pain after fall EXAM: CHEST  1 VIEW COMPARISON:  11/06/2016 FINDINGS: Cardiomegaly. Mild hyperinflation of the lungs. Mild vascular congestion. Small left pleural effusion with left base atelectasis. Calcified  granuloma in the left lung base. IMPRESSION: Cardiomegaly with vascular congestion. Hyperinflation. Small left effusion with left base atelectasis. Electronically Signed   By: Rolm Baptise M.D.   On: 06/19/2017 14:34   Ct Head Wo Contrast  Result Date: 06/19/2017 CLINICAL DATA:  Fall EXAM: CT HEAD WITHOUT CONTRAST TECHNIQUE: Contiguous axial images were obtained from the base of the skull through the vertex without intravenous contrast. COMPARISON:  None. FINDINGS: Brain: No acute territorial infarction, hemorrhage or intracranial mass is visualized. Moderate atrophy. Moderate small vessel ischemic  changes of the white matter. Prominent ventricle size, felt related to atrophy. Vascular: No hyperdense vessels.  Carotid vascular calcification. Skull: Normal. Negative for fracture or focal lesion. Sinuses/Orbits: No acute finding. Other: None IMPRESSION: 1. No CT evidence for acute intracranial abnormality. 2. Atrophy and moderate small vessel ischemic changes of the white matter Electronically Signed   By: Donavan Foil M.D.   On: 06/19/2017 14:20   Dg Hip Unilat W Or Wo Pelvis 2-3 Views Right  Result Date: 06/19/2017 CLINICAL DATA:  Right hip pain after fall. EXAM: DG HIP (WITH OR WITHOUT PELVIS) 2-3V RIGHT COMPARISON:  None. FINDINGS: Severely displaced fracture is seen involving the intertrochanteric region of the proximal right femur. Right femoral head is well situated within acetabulum. IMPRESSION: Severely displaced intertrochanteric fracture of proximal right femur. Electronically Signed   By: Marijo Conception, M.D.   On: 06/19/2017 14:38    Positive ROS: All other systems have been reviewed and were otherwise negative with the exception of those mentioned in the HPI and as above.  Physical Exam: General: Well developed, well nourished female seen in no acute distress. HEENT: Atraumatic and normocephalic. Sclera are clear. Extraocular motion is intact. Oropharynx is clear with moist  mucosa. Neck: Supple, nontender, good range of motion.  Lungs: Clear to auscultation bilaterally. Cardiovascular: Regular rate and rhythm with normal S1 and S2.  2/6 murmur. No gallops or rubs. Pedal pulses are palpable bilaterally. Homans test is negative bilaterally. No significant pretibial or ankle edema. Abdomen: Soft, nontender, and nondistended. Bowel sounds are present. Skin: No lesions in the area of chief complaint Neurologic: Awake, alert, and oriented. Sensory function is grossly intact. Motor strength is felt to be 5 over 5 bilaterally with the exception of the right lower extremity that was not evaluated secondary to the injury. No clonus or tremor. Good motor coordination. Lymphatic: No axillary or cervical lymphadenopathy  MUSCULOSKELETAL: The right lower extremity is shortened and externally rotated.  Pain is elicited with any attempted range of motion of the right hip.  No appreciable ecchymosis or swelling to the thigh.  No tenderness to palpation about the right knee.  No knee effusion.  Assessment: Right pertrochanteric femur fracture with reverse obliquity  Plan: The findings were discussed in detail with the patient.  Recommendation was made for open reduction and internal fixation of the pertrochanteric femur fracture.  The usual perioperative course was discussed. The risks and benefits of surgical intervention were reviewed. The patient expressed understanding of the risks and benefits and agreed with plans for surgical intervention.  The patient took her last dose of Eliquis this morning.  Eliquis has been discontinued.  Current guidelines recommend 48-72-hour interval between the last dose of Eliquis and a procedure with a high bleeding risk.  I will make tentative arrangements for surgery on Wednesday.  The surgical site was signed as per the "right site surgery" protocol.   Keyondre Hepburn P. Holley Bouche M.D.

## 2017-06-19 NOTE — ED Notes (Signed)
Pt returned from CT and xray at this time.

## 2017-06-19 NOTE — ED Notes (Signed)
Report called to floor

## 2017-06-19 NOTE — ED Notes (Signed)
Patient transported to CT 

## 2017-06-20 DIAGNOSIS — E440 Moderate protein-calorie malnutrition: Secondary | ICD-10-CM

## 2017-06-20 LAB — BASIC METABOLIC PANEL WITH GFR
Anion gap: 8 (ref 5–15)
BUN: 23 mg/dL — ABNORMAL HIGH (ref 6–20)
CO2: 25 mmol/L (ref 22–32)
Calcium: 8.9 mg/dL (ref 8.9–10.3)
Chloride: 104 mmol/L (ref 101–111)
Creatinine, Ser: 0.76 mg/dL (ref 0.44–1.00)
GFR calc Af Amer: >60 mL/min
GFR calc non Af Amer: >60 mL/min
Glucose, Bld: 111 mg/dL — ABNORMAL HIGH (ref 65–99)
Potassium: 4.2 mmol/L (ref 3.5–5.1)
Sodium: 137 mmol/L (ref 135–145)

## 2017-06-20 LAB — CBC
HCT: 35.4 % (ref 35.0–47.0)
Hemoglobin: 11.6 g/dL — ABNORMAL LOW (ref 12.0–16.0)
MCH: 29.7 pg (ref 26.0–34.0)
MCHC: 32.7 g/dL (ref 32.0–36.0)
MCV: 90.8 fL (ref 80.0–100.0)
Platelets: 155 K/uL (ref 150–440)
RBC: 3.9 MIL/uL (ref 3.80–5.20)
RDW: 16.2 % — ABNORMAL HIGH (ref 11.5–14.5)
WBC: 10.7 K/uL (ref 3.6–11.0)

## 2017-06-20 LAB — ABO/RH: ABO/RH(D): A NEG

## 2017-06-20 MED ORDER — ENSURE ENLIVE PO LIQD
237.0000 mL | Freq: Two times a day (BID) | ORAL | Status: DC
  Administered 2017-06-20 – 2017-06-23 (×3): 237 mL via ORAL

## 2017-06-20 MED ORDER — DOCUSATE SODIUM 100 MG PO CAPS
100.0000 mg | ORAL_CAPSULE | Freq: Two times a day (BID) | ORAL | Status: DC
  Administered 2017-06-20: 100 mg via ORAL
  Filled 2017-06-20: qty 1

## 2017-06-20 MED ORDER — CEFAZOLIN SODIUM-DEXTROSE 2-4 GM/100ML-% IV SOLN
2.0000 g | INTRAVENOUS | Status: DC
Start: 2017-06-20 — End: 2017-06-21
  Filled 2017-06-20: qty 100

## 2017-06-20 MED ORDER — POLYETHYLENE GLYCOL 3350 17 G PO PACK
17.0000 g | PACK | Freq: Every day | ORAL | Status: DC
  Administered 2017-06-20 – 2017-06-22 (×2): 17 g via ORAL
  Filled 2017-06-20 (×3): qty 1

## 2017-06-20 MED ORDER — ADULT MULTIVITAMIN W/MINERALS CH
1.0000 | ORAL_TABLET | Freq: Every day | ORAL | Status: DC
  Administered 2017-06-22 – 2017-06-23 (×2): 1 via ORAL
  Filled 2017-06-20 (×2): qty 1

## 2017-06-20 NOTE — Progress Notes (Signed)
[facility] NAME: Lisa [name]    MR#:  [id number]  DATE OF BIRTH:  Apr 23, 1928  SUBJECTIVE:  CHIEF COMPLAINT:   Chief Complaint  Patient presents with  . Fall   Right hip pain. REVIEW OF SYSTEMS:  Review of Systems  Constitutional: Negative for chills, fever and malaise/fatigue.  HENT: Negative for sore throat.   Eyes: Negative for blurred vision and double vision.  Respiratory: Negative for cough, hemoptysis, shortness of breath, wheezing and stridor.   Cardiovascular: Negative for chest pain, palpitations, orthopnea and leg swelling.  Gastrointestinal: Negative for abdominal pain, blood in stool, diarrhea, melena, nausea and vomiting.  Genitourinary: Negative for dysuria, flank pain and hematuria.  Musculoskeletal: Positive for joint pain. Negative for back pain.  Skin: Negative for rash.  Neurological: Negative for dizziness, sensory change, focal weakness, seizures, loss of consciousness, weakness and headaches.  Endo/Heme/Allergies: Negative for polydipsia.  Psychiatric/Behavioral: Negative for depression. The patient is not nervous/anxious.     DRUG ALLERGIES:   Allergies  Allergen Reactions  . Ace Inhibitors Cough  . Benadryl [Diphenhydramine Hcl (Sleep)]   . Diphenhydramine-Apap (Sleep) Other (See Comments)    Reaction: Couldn't use kidneys  . Tylenol Pm Extra [Diphenhydramine-Apap (Sleep)]     Reaction: Couldn't use kidneys   VITALS:  Blood pressure (!) 134/46, pulse 68, temperature 98.2 F (36.8 C), temperature source Oral, resp. rate 16, height 5' 2.5" (1.588 m), weight 124 lb (56.2 kg), SpO2 97 %. PHYSICAL EXAMINATION:  Physical Exam  Constitutional: She is oriented to person, place, and time and well-developed, well-nourished, and in no distress.  HENT:  Head: Normocephalic.  Mouth/Throat: Oropharynx is clear and moist.  Eyes: Pupils are equal, round, and reactive to light. Conjunctivae and EOM  are normal. No scleral icterus.  Neck: Normal range of motion. Neck supple. No JVD present. No tracheal deviation present.  Cardiovascular: Normal rate, regular rhythm and normal heart sounds. Exam reveals no gallop.  No murmur heard. Pulmonary/Chest: Effort normal and breath sounds normal. No respiratory distress. She has no wheezes. She has no rales.  Abdominal: Soft. Bowel sounds are normal. She exhibits no distension. There is no tenderness. There is no rebound.  Musculoskeletal: She exhibits no edema or tenderness.  right lower extremity deformity.  Neurological: She is alert and oriented to person, place, and time. No cranial nerve deficit.  Skin: No rash noted. No erythema.  Psychiatric: Affect normal.   LABORATORY PANEL:  Female CBC Recent Labs  Lab 06/20/17 0449  WBC 10.7  HGB 11.6*  HCT 35.4  PLT 155   ------------------------------------------------------------------------------------------------------------------ Chemistries  Recent Labs  Lab 06/20/17 0449  NA 137  K 4.2  CL 104  CO2 25  GLUCOSE 111*  BUN 23*  CREATININE 0.76  CALCIUM 8.9   RADIOLOGY:  No results found. ASSESSMENT AND PLAN:   Lisa [name]  is a 84 y.o. female with a known history of paroxysmal atrial fibrillation on eliquis, hypertension, hyperlipidemia who lives independently at home was brought in secondary to a fall and right femoral intertrochanteric fracture.  1. Right Hip intertrochanteric fracture- secondary to mechanical fall. Pain control, hold eliquis. -okay to proceed with right hip surgery from cardiac standpoint.  Follow-up with Dr. Marry Guan for hip surgery tomorrow. -Physical therapy after surgery.  2.  Paroxysmal atrial fibrillation- currently in sinus rhythm On low dose metoprolol - eliquis on hold  3. Hypertension- on metoprolol and losartan  4. DVT prophylaxis- none until the  surgery  Mild dehydration.  Encourage oral intake.  All the records are reviewed and  case discussed with Care Management/Social Worker. Management plans discussed with the patient, her daughter and they are in agreement.  CODE STATUS: Full Code  TOTAL TIME TAKING CARE OF THIS PATIENT: 33 minutes.   More than 50% of the time was spent in counseling/coordination of care: YES  POSSIBLE D/C IN 3 DAYS, DEPENDING ON CLINICAL CONDITION.   Demetrios Loll M.D on 06/20/2017 at 4:47 PM  Between 7am to 6pm - Pager - [phone]  After 6pm go to [email or web address] - [facility]

## 2017-06-20 NOTE — NC FL2 (Signed)
[place] LEVEL OF CARE SCREENING TOOL     IDENTIFICATION  Patient Name: Lisa [name] Birthdate: 10/03/1928 Sex: female Admission Date (Current Location): 06/19/2017  [employer] and [place] Number:  Engineering geologist and Address:  [facility], [address], [place], [place] [address]      Provider Number: [id number]  Attending Physician Name and Address:  Demetrios Loll, MD  Relative Name and Phone Number:       Current Level of Care: Hospital Recommended Level of Care: [place] Prior Approval Number:    Date Approved/Denied:   PASRR Number: [id number])  Discharge Plan: SNF    Current Diagnoses: Patient Active Problem List   Diagnosis Date Noted  . Closed right hip fracture ([place]) 06/19/2017  . Intractable pain 11/06/2016  . Cystocele, midline 04/28/2016  . Incomplete bladder emptying 04/28/2016  . Cystocele 02/12/2015  . Vaginal enterocele 02/12/2015  . Absolute anemia 11/13/2014  . Benign essential HTN 11/13/2014  . APC (atrial premature contractions) 11/13/2014  . Awareness of heartbeats 11/13/2014  . Beat, premature ventricular 11/13/2014  . Arrhythmia, sinus node 11/13/2014  . Combined fat and carbohydrate induced hyperlipemia 04/23/2014  . MI (mitral incompetence) 04/23/2014  . AF (paroxysmal atrial fibrillation) ([place]) 04/23/2014  . Basal cell carcinoma of face 09/18/2012    Orientation RESPIRATION BLADDER Height & Weight     Self, Time, Situation, Place  Normal Continent Weight: 124 lb (56.2 kg) Height:  5' 2.5" (158.8 cm)  BEHAVIORAL SYMPTOMS/MOOD NEUROLOGICAL BOWEL NUTRITION STATUS      Continent Diet(2 Grams of Sodium)  AMBULATORY STATUS COMMUNICATION OF NEEDS Skin   Extensive Assist Verbally Normal                       Personal Care Assistance Level of Assistance  Bathing, Feeding, Dressing Bathing Assistance: Limited assistance Feeding assistance: Independent Dressing  Assistance: Limited assistance     Functional Limitations Info  Sight, Hearing, Speech Sight Info: Adequate Hearing Info: Adequate Speech Info: Adequate    SPECIAL CARE FACTORS FREQUENCY  PT (By licensed PT), OT (By licensed OT)     PT Frequency: (5) OT Frequency: (5)            Contractures      Additional Factors Info  Code Status, Allergies Code Status Info: (Full Code) Allergies Info: (ACE INHIBITORS, BENADRYL DIPHENHYDRAMINE HCL (SLEEP), DIPHENHYDRAMINE-APAP (SLEEP), TYLENOL PM EXTRA DIPHENHYDRAMINE-APAP (SLEEP) )           Current Medications (06/20/2017):  This is the current hospital active medication list Current Facility-Administered Medications  Medication Dose Route Frequency Provider Last Rate Last Dose  . acetaminophen (TYLENOL) tablet 650 mg  650 mg Oral Q6H PRN Gladstone Lighter, MD   650 mg at 06/20/17 3557   Or  . acetaminophen (TYLENOL) suppository 650 mg  650 mg Rectal Q6H PRN Gladstone Lighter, MD      . cholecalciferol (VITAMIN D) tablet 2,000 Units  2,000 Units Oral Daily Gladstone Lighter, MD      . docusate sodium (COLACE) capsule 100 mg  100 mg Oral BID PRN Gladstone Lighter, MD      . losartan (COZAAR) tablet 25 mg  25 mg Oral Daily Gladstone Lighter, MD      . metoprolol tartrate (LOPRESSOR) tablet 25 mg  25 mg Oral BID Gladstone Lighter, MD   25 mg at 06/19/17 2252  . morphine 2 MG/ML injection 2 mg  2 mg Intravenous Q4H PRN  Gladstone Lighter, MD      . mupirocin ointment (BACTROBAN) 2 % 1 application  1 application Nasal BID Gladstone Lighter, MD   1 application at 75/64/33 2255  . ondansetron (ZOFRAN) tablet 4 mg  4 mg Oral Q6H PRN Gladstone Lighter, MD       Or  . ondansetron (ZOFRAN) injection 4 mg  4 mg Intravenous Q6H PRN Gladstone Lighter, MD      . oxyCODONE (Oxy IR/ROXICODONE) immediate release tablet 5 mg  5 mg Oral Q4H PRN Gladstone Lighter, MD   5 mg at 06/19/17 2252  . polyethylene glycol (MIRALAX / GLYCOLAX) packet 17  g  17 g Oral Daily PRN Gladstone Lighter, MD      . rosuvastatin (CRESTOR) tablet 5 mg  5 mg Oral q1800 Gladstone Lighter, MD      . sodium chloride (OCEAN) 0.65 % nasal spray 1 spray  1 spray Each Nare QHS Gladstone Lighter, MD   1 spray at 06/19/17 2254  . vitamin B-12 (CYANOCOBALAMIN) tablet 1,000 mcg  1,000 mcg Oral Daily Gladstone Lighter, MD         Discharge Medications: Please see discharge summary for a list of discharge medications.  Relevant Imaging Results:  Relevant Lab Results:   Additional Information (SSN: [id number])  [name], Student-Social Work

## 2017-06-20 NOTE — Progress Notes (Signed)
Patient refused low bed, stated she was comfortable and will not attempt to get out of bed. Pt is A&Ox4

## 2017-06-20 NOTE — Clinical Social Work Note (Signed)
Clinical Social Work Assessment  Patient Details  Name: Lisa [name] MRN: [id number] Date of Birth: 13-Apr-1928  Date of referral:  06/20/17               Reason for consult:  Facility Placement                Permission sought to share information with:  Chartered certified accountant granted to share information::  Yes, Verbal Permission Granted  Name::      [place]::   [place]  Relationship::     Contact Information:     Housing/Transportation Living arrangements for the past 2 months:  [place] of Information:  Patient Patient Interpreter Needed:  None Criminal Activity/Legal Involvement Pertinent to Current Situation/Hospitalization:  No - Comment as needed Significant Relationships:  Adult Children Lives with:  Self Do you feel safe going back to the place where you live?  Yes Need for family participation in patient care:  Yes (Comment)  Care giving concerns: Patient lives alone in [place], [place].   Social Worker assessment / plan: Holiday representative (CSW) reviewed patient's chart and noted that she has a hip fracture. Surgery and PT are pending. Social work Theatre manager met with patient alone at bedside. Patient was laying in bed alert and oriented x4. Social work Theatre manager introduced self and explained the role of the [place]. Patient shared that she lives in [place] alone and daughter Lisa [name] [phone]) is her HPOA. Social work Theatre manager explained that surgery and PT are pending, PT will recommend [facility] or SNF placement, and [employer] will have to approve it. Patient verbalized her understanding and is interested in SNF placement. Patient prefers [facility] and is comfortable with a semi-private room. FL2 completed and faxed out.   Social work Theatre manager presented bed offers to patient. Patient chose [facility]. Sharyn Lull, admission coordinator at [facility], is aware of accepted bed  offer and will start [employer]. CSW and Social work Theatre manager will continue to follow up and assist.  Employment status:  Retired Nurse, adult PT Recommendations:  Not assessed at this time Information / Referral to community resources:     Patient/Family's Response to care: Patient is interested in SNF placement at [facility] pending PT recommendation.  Patient/Family's Understanding of and Emotional Response to Diagnosis, Current Treatment, and Prognosis: Patient was pleasant and thanked social work Theatre manager for her assistance.  Emotional Assessment Appearance:  Appears stated age Attitude/Demeanor/Rapport:    Affect (typically observed):  Accepting, Calm, Pleasant Orientation:  Oriented to Self, Oriented to Place, Oriented to  Time, Oriented to Situation Alcohol / Substance use:  Not Applicable Psych involvement (Current and /or in the community):  No (Comment)  Discharge Needs  Concerns to be addressed:  Care Coordination, Discharge Planning Concerns Readmission within the last 30 days:  No Current discharge risk:  Dependent with Mobility Barriers to Discharge:  Continued Medical Work up   Lisa [name], Student-Social Work 06/20/2017, 10:02 AM

## 2017-06-20 NOTE — Progress Notes (Signed)
Initial Nutrition Assessment  DOCUMENTATION CODES:   Non-severe (moderate) malnutrition in context of chronic illness  INTERVENTION:  Left note on diet order to provide meats. Patient does not want her meat chopped if it is a sandwich or burger. Updated note in [place].  Provide Ensure Enlive po BID, each supplement provides 350 kcal and 20 grams of protein. Patient prefers vanilla.  Provide daily MVI.  Discussed the increased needs patient will have for calories and protein following her surgery this week.  NUTRITION DIAGNOSIS:   Moderate Malnutrition related to chronic illness(decreased appetite and intake following kyphoplasty 11/08/2016) as evidenced by moderate fat depletion, mild muscle depletion, moderate muscle depletion.  GOAL:   Patient will meet greater than or equal to 90% of their needs  MONITOR:   PO intake, Supplement acceptance, Labs, Weight trends, Skin, I & O's  REASON FOR ASSESSMENT:   Malnutrition Screening Tool    ASSESSMENT:   84 year old female with PMHx of HLD, HTN, constipation, cystocele, A-fib, basal cell carcinoma s/p excision, hx of fall resulting in L1 vertebral fracture s/p kyphoplasty 11/08/2016 who is now admitted after another mechanical fall found to have right femoral intertrochanteric fracture with plan for open reduction and internal fixation of fracture this week (tentatively planned for 3/27).   Met with patient and her neighbor at bedside. Patient reports she is very close with her neighbor and gave permission to proceed with assessment with neighbor present. She reports she has not been eating well since her kyphoplasty. She is less active, which she thinks is causing her anorexia. She was going to start back with PT this week before she had her fall. She follows a low salt diet at home in setting of HTN and previous issues with fluid retention. She reports she needs her meat cut-up at meals because she is too weak to do it herself. At  each meal she tries to eat a source of protein (meat, beans, or cottage cheese), some fruit, and some vegetables. She is lactose-intolerant except she can tolerate cottage cheese.  Patient reports UBW was 140 lbs and she has been losing weight since her kyphoplasty. Per chart she was 139.1 lbs on 11/09/2016. She was 123.4 lbs on 06/08/2017 at outpatient visit. She has lost 15.7 lbs (11.3% body weight) over 7 months, which is not quite significant for time frame but very close to being significant.  Medications reviewed and include: vitamin D 2000 units daily, Colace, Miralax, vitamin B12 1000 micrograms daily.  Labs reviewed: BUN 23.  NUTRITION - FOCUSED PHYSICAL EXAM:    Most Recent Value  Orbital Region  Moderate depletion  Upper Arm Region  Moderate depletion  Thoracic and Lumbar Region  Mild depletion  Buccal Region  Moderate depletion  Temple Region  Mild depletion  Clavicle Bone Region  Mild depletion  Clavicle and Acromion Bone Region  Mild depletion  Scapular Bone Region  Mild depletion  Dorsal Hand  Moderate depletion  Patellar Region  Mild depletion  Anterior Thigh Region  Mild depletion  Posterior Calf Region  Moderate depletion  Edema (RD Assessment)  None  Hair  Reviewed  Eyes  Reviewed  Mouth  Reviewed  Skin  Reviewed  Nails  Reviewed     Diet Order:  Diet 2 gram sodium Room service appropriate? Yes with Assist; Fluid consistency: Thin  EDUCATION NEEDS:   Education needs have been addressed  Skin:  Skin Assessment: Reviewed RN Assessment  Last BM:  PTA (06/18/2017 per chart)  Height:  Ht Readings from Last 1 Encounters:  06/19/17 5' 2.5" (1.588 m)    Weight:   Wt Readings from Last 1 Encounters:  06/19/17 124 lb (56.2 kg)    Ideal Body Weight:  51.1 kg  BMI:  Body mass index is 22.32 kg/m.  Estimated Nutritional Needs:   Kcal:  1400-1600 (25-28 kcal/kg)  Protein:  70-85 grams (1.25-1.5 grams/kg)  Fluid:  1.4 L/day (25 mL/kg)  Willey Blade, MS, RD, LDN Office: [phone] Pager: [phone] After Hours/Weekend Pager: [phone]

## 2017-06-20 NOTE — Clinical Social Work Placement (Signed)
   CLINICAL SOCIAL WORK PLACEMENT  NOTE  Date:  06/20/2017  Patient Details  Name: Lisa [name] MRN: [id number] Date of Birth: 1929/03/19  Clinical Social Work is seeking post-discharge placement for this patient at the [place] level of care (*CSW will initial, date and re-position this form in  chart as items are completed):  Yes   Patient/family provided with [place] Work Department's list of facilities offering this level of care within the geographic area requested by the patient (or if unable, by the patient's family).  Yes   Patient/family informed of their freedom to choose among providers that offer the needed level of care, that participate in Medicare, Medicaid or managed care program needed by the patient, have an available bed and are willing to accept the patient.  Yes   Patient/family informed of [facility] ownership interest in [facility] and [facility], as well as of the fact that they are under no obligation to receive care at these facilities.  PASRR submitted to EDS on       PASRR number received on       Existing PASRR number confirmed on 06/20/17     FL2 transmitted to all facilities in geographic area requested by pt/family on 06/20/17     FL2 transmitted to all facilities within larger geographic area on       Patient informed that his/her managed care company has contracts with or will negotiate with certain facilities, including the following:        Yes   Patient/family informed of bed offers received.  Patient chooses bed at [facility])     Physician recommends and patient chooses bed at      Patient to be transferred to   on  .  Patient to be transferred to facility by       Patient family notified on   of transfer.  Name of family member notified:        PHYSICIAN       Additional Comment:    _______________________________________________ Jammie Troup, Veronia Beets, LCSW 06/20/2017,  11:56 AM

## 2017-06-21 ENCOUNTER — Encounter
Admission: EM | Disposition: A | Discharge status: Skilled Nursing Facility | Payer: Self-pay | Source: Home / Self Care | Attending: Internal Medicine

## 2017-06-21 ENCOUNTER — Inpatient Hospital Stay: Payer: Medicare Other | Admitting: Anesthesiology

## 2017-06-21 ENCOUNTER — Inpatient Hospital Stay: Payer: Medicare Other

## 2017-06-21 ENCOUNTER — Encounter: Payer: Self-pay | Admitting: Anesthesiology

## 2017-06-21 HISTORY — PX: INTRAMEDULLARY (IM) NAIL INTERTROCHANTERIC: SHX5875

## 2017-06-21 SURGERY — FIXATION, FRACTURE, INTERTROCHANTERIC, WITH INTRAMEDULLARY ROD
Anesthesia: General | Site: Hip | Laterality: Right | Wound class: Clean

## 2017-06-21 MED ORDER — LIDOCAINE HCL (CARDIAC) 20 MG/ML IV SOLN
INTRAVENOUS | Status: DC | PRN
  Administered 2017-06-21: 60 mg via INTRAVENOUS

## 2017-06-21 MED ORDER — NEOMYCIN-POLYMYXIN B GU 40-200000 IR SOLN
Status: DC | PRN
  Administered 2017-06-21: 4 mL

## 2017-06-21 MED ORDER — ACETAMINOPHEN 10 MG/ML IV SOLN
INTRAVENOUS | Status: DC | PRN
  Administered 2017-06-21: 1000 mg via INTRAVENOUS

## 2017-06-21 MED ORDER — ONDANSETRON HCL 4 MG/2ML IJ SOLN
INTRAMUSCULAR | Status: DC | PRN
Start: 2017-06-21 — End: 2017-06-21
  Administered 2017-06-21: 4 mg via INTRAVENOUS

## 2017-06-21 MED ORDER — EPHEDRINE SULFATE 50 MG/ML IJ SOLN
INTRAMUSCULAR | Status: DC | PRN
  Administered 2017-06-21: 10 mg via INTRAVENOUS
  Administered 2017-06-21: 5 mg via INTRAVENOUS

## 2017-06-21 MED ORDER — ACETAMINOPHEN 10 MG/ML IV SOLN
1000.0000 mg | Freq: Four times a day (QID) | INTRAVENOUS | Status: AC
Start: 2017-06-22 — End: 2017-06-22
  Administered 2017-06-21 – 2017-06-22 (×4): 1000 mg via INTRAVENOUS
  Filled 2017-06-21 (×4): qty 100

## 2017-06-21 MED ORDER — PROPOFOL 10 MG/ML IV BOLUS
INTRAVENOUS | Status: AC
  Filled 2017-06-21: qty 20

## 2017-06-21 MED ORDER — ACETAMINOPHEN 10 MG/ML IV SOLN
INTRAVENOUS | Status: AC
  Filled 2017-06-21: qty 100

## 2017-06-21 MED ORDER — ONDANSETRON HCL 4 MG/2ML IJ SOLN: 4.0000 mg | Freq: Once | INTRAMUSCULAR | Status: DC | PRN

## 2017-06-21 MED ORDER — SODIUM CHLORIDE 0.9 % IV SOLN
INTRAVENOUS | Status: DC
  Administered 2017-06-21: 12:00:00 via INTRAVENOUS

## 2017-06-21 MED ORDER — CEFAZOLIN SODIUM-DEXTROSE 2-4 GM/100ML-% IV SOLN
2.0000 g | Freq: Four times a day (QID) | INTRAVENOUS | Status: AC
  Administered 2017-06-21 – 2017-06-22 (×4): 2 g via INTRAVENOUS
  Filled 2017-06-21 (×4): qty 100

## 2017-06-21 MED ORDER — ONDANSETRON HCL 4 MG PO TABS: 4.0000 mg | ORAL_TABLET | Freq: Four times a day (QID) | ORAL | Status: DC | PRN

## 2017-06-21 MED ORDER — HYDROMORPHONE HCL 1 MG/ML IJ SOLN: 0.5000 mg | INTRAMUSCULAR | Status: DC | PRN

## 2017-06-21 MED ORDER — PHENYLEPHRINE HCL 10 MG/ML IJ SOLN
INTRAMUSCULAR | Status: DC | PRN
  Administered 2017-06-21: 100 ug via INTRAVENOUS

## 2017-06-21 MED ORDER — ROCURONIUM BROMIDE 50 MG/5ML IV SOLN
INTRAVENOUS | Status: AC
Start: 2017-06-21 — End: ?
  Filled 2017-06-21: qty 1

## 2017-06-21 MED ORDER — CEFAZOLIN SODIUM 1 G IJ SOLR
INTRAMUSCULAR | Status: AC
  Filled 2017-06-21: qty 20

## 2017-06-21 MED ORDER — FLEET ENEMA 7-19 GM/118ML RE ENEM: 1.0000 | ENEMA | Freq: Once | RECTAL | Status: DC | PRN

## 2017-06-21 MED ORDER — ACETAMINOPHEN 325 MG PO TABS: 325.0000 mg | ORAL_TABLET | Freq: Four times a day (QID) | ORAL | Status: DC | PRN

## 2017-06-21 MED ORDER — FENTANYL CITRATE (PF) 100 MCG/2ML IJ SOLN: 25.0000 ug | INTRAMUSCULAR | Status: DC | PRN

## 2017-06-21 MED ORDER — SENNOSIDES-DOCUSATE SODIUM 8.6-50 MG PO TABS
1.0000 | ORAL_TABLET | Freq: Two times a day (BID) | ORAL | Status: DC
  Administered 2017-06-21 – 2017-06-22 (×3): 1 via ORAL
  Filled 2017-06-21 (×4): qty 1

## 2017-06-21 MED ORDER — METOCLOPRAMIDE HCL 5 MG/ML IJ SOLN: 5.0000 mg | Freq: Three times a day (TID) | INTRAMUSCULAR | Status: DC | PRN

## 2017-06-21 MED ORDER — SUCCINYLCHOLINE CHLORIDE 20 MG/ML IJ SOLN
INTRAMUSCULAR | Status: DC | PRN
  Administered 2017-06-21: 100 mg via INTRAVENOUS

## 2017-06-21 MED ORDER — ONDANSETRON HCL 4 MG/2ML IJ SOLN
INTRAMUSCULAR | Status: AC
  Filled 2017-06-21: qty 2

## 2017-06-21 MED ORDER — APIXABAN 2.5 MG PO TABS
2.5000 mg | ORAL_TABLET | Freq: Two times a day (BID) | ORAL | Status: DC
  Administered 2017-06-22 – 2017-06-23 (×3): 2.5 mg via ORAL
  Filled 2017-06-21 (×4): qty 1

## 2017-06-21 MED ORDER — FENTANYL CITRATE (PF) 100 MCG/2ML IJ SOLN
INTRAMUSCULAR | Status: AC
  Filled 2017-06-21: qty 2

## 2017-06-21 MED ORDER — OXYCODONE HCL 5 MG PO TABS
5.0000 mg | ORAL_TABLET | ORAL | Status: DC | PRN
  Administered 2017-06-22 – 2017-06-23 (×3): 5 mg via ORAL
  Filled 2017-06-21 (×3): qty 1

## 2017-06-21 MED ORDER — SODIUM CHLORIDE 0.9 % IV SOLN
INTRAVENOUS | Status: DC
  Administered 2017-06-21 – 2017-06-22 (×2): via INTRAVENOUS

## 2017-06-21 MED ORDER — MENTHOL 3 MG MT LOZG
1.0000 | LOZENGE | OROMUCOSAL | Status: DC | PRN
  Filled 2017-06-21: qty 9

## 2017-06-21 MED ORDER — ONDANSETRON HCL 4 MG/2ML IJ SOLN: 4.0000 mg | Freq: Four times a day (QID) | INTRAMUSCULAR | Status: DC | PRN

## 2017-06-21 MED ORDER — TRAMADOL HCL 50 MG PO TABS: 50.0000 mg | ORAL_TABLET | ORAL | Status: DC | PRN

## 2017-06-21 MED ORDER — MIDAZOLAM HCL 2 MG/2ML IJ SOLN
INTRAMUSCULAR | Status: AC
  Filled 2017-06-21: qty 2

## 2017-06-21 MED ORDER — OXYCODONE HCL 5 MG PO TABS: 10.0000 mg | ORAL_TABLET | ORAL | Status: DC | PRN

## 2017-06-21 MED ORDER — SUCCINYLCHOLINE CHLORIDE 20 MG/ML IJ SOLN
INTRAMUSCULAR | Status: AC
  Filled 2017-06-21: qty 1

## 2017-06-21 MED ORDER — FERROUS SULFATE 325 (65 FE) MG PO TABS
325.0000 mg | ORAL_TABLET | Freq: Two times a day (BID) | ORAL | Status: DC
  Administered 2017-06-22 – 2017-06-23 (×3): 325 mg via ORAL
  Filled 2017-06-21 (×3): qty 1

## 2017-06-21 MED ORDER — DEXAMETHASONE SODIUM PHOSPHATE 10 MG/ML IJ SOLN
INTRAMUSCULAR | Status: AC
  Filled 2017-06-21: qty 1

## 2017-06-21 MED ORDER — ROCURONIUM BROMIDE 100 MG/10ML IV SOLN
INTRAVENOUS | Status: DC | PRN
  Administered 2017-06-21: 25 mg via INTRAVENOUS
  Administered 2017-06-21: 5 mg via INTRAVENOUS

## 2017-06-21 MED ORDER — METOCLOPRAMIDE HCL 10 MG PO TABS
10.0000 mg | ORAL_TABLET | Freq: Three times a day (TID) | ORAL | Status: DC
  Administered 2017-06-22 – 2017-06-23 (×5): 10 mg via ORAL
  Filled 2017-06-21 (×6): qty 1

## 2017-06-21 MED ORDER — MAGNESIUM HYDROXIDE 400 MG/5ML PO SUSP
30.0000 mL | Freq: Every day | ORAL | Status: DC | PRN
  Administered 2017-06-22: 30 mL via ORAL
  Filled 2017-06-21: qty 30

## 2017-06-21 MED ORDER — CEFAZOLIN SODIUM-DEXTROSE 2-3 GM-%(50ML) IV SOLR
INTRAVENOUS | Status: DC | PRN
  Administered 2017-06-21: 2 g via INTRAVENOUS

## 2017-06-21 MED ORDER — BISACODYL 10 MG RE SUPP: 10.0000 mg | Freq: Every day | RECTAL | Status: DC | PRN

## 2017-06-21 MED ORDER — PROPOFOL 10 MG/ML IV BOLUS
INTRAVENOUS | Status: DC | PRN
  Administered 2017-06-21: 140 mg via INTRAVENOUS

## 2017-06-21 MED ORDER — GABAPENTIN 300 MG PO CAPS
300.0000 mg | ORAL_CAPSULE | Freq: Every day | ORAL | Status: DC
  Administered 2017-06-21 – 2017-06-22 (×2): 300 mg via ORAL
  Filled 2017-06-21 (×2): qty 1

## 2017-06-21 MED ORDER — PHENOL 1.4 % MT LIQD
1.0000 | OROMUCOSAL | Status: DC | PRN
  Administered 2017-06-22: 1 via OROMUCOSAL
  Filled 2017-06-21: qty 177

## 2017-06-21 MED ORDER — LACTATED RINGERS IV SOLN
INTRAVENOUS | Status: DC | PRN
  Administered 2017-06-21: 19:00:00 via INTRAVENOUS

## 2017-06-21 MED ORDER — CELECOXIB 200 MG PO CAPS
200.0000 mg | ORAL_CAPSULE | Freq: Two times a day (BID) | ORAL | Status: DC
  Administered 2017-06-21 – 2017-06-23 (×4): 200 mg via ORAL
  Filled 2017-06-21 (×4): qty 1

## 2017-06-21 MED ORDER — METOCLOPRAMIDE HCL 10 MG PO TABS: 5.0000 mg | ORAL_TABLET | Freq: Three times a day (TID) | ORAL | Status: DC | PRN

## 2017-06-21 MED ORDER — MIDAZOLAM HCL 5 MG/5ML IJ SOLN
INTRAMUSCULAR | Status: DC | PRN
  Administered 2017-06-21: 1 mg via INTRAVENOUS

## 2017-06-21 MED ORDER — SUGAMMADEX SODIUM 200 MG/2ML IV SOLN
INTRAVENOUS | Status: DC | PRN
  Administered 2017-06-21: 120 mg via INTRAVENOUS

## 2017-06-21 MED ORDER — PANTOPRAZOLE SODIUM 40 MG PO TBEC
40.0000 mg | DELAYED_RELEASE_TABLET | Freq: Two times a day (BID) | ORAL | Status: DC
  Administered 2017-06-21 – 2017-06-22 (×2): 40 mg via ORAL
  Filled 2017-06-21 (×4): qty 1

## 2017-06-21 MED ORDER — FENTANYL CITRATE (PF) 100 MCG/2ML IJ SOLN
INTRAMUSCULAR | Status: DC | PRN
  Administered 2017-06-21 (×2): 50 ug via INTRAVENOUS

## 2017-06-21 SURGICAL SUPPLY — 35 items
BLADE HELICAL 90 (Orthopedic Implant) ×2 IMPLANT
BLADE HELICAL 90MM (Orthopedic Implant) ×1 IMPLANT
CANISTER SUCT 1200ML W/VALVE (MISCELLANEOUS) ×3 IMPLANT
DRAPE C-ARMOR (DRAPES) ×3 IMPLANT
DRAPE SHEET LG 3/4 BI-LAMINATE (DRAPES) ×3 IMPLANT
DRSG DERMACEA 8X12 NADH (GAUZE/BANDAGES/DRESSINGS) ×3 IMPLANT
DRSG OPSITE POSTOP 3X4 (GAUZE/BANDAGES/DRESSINGS) ×3 IMPLANT
DRSG OPSITE POSTOP 4X12 (GAUZE/BANDAGES/DRESSINGS) ×3 IMPLANT
DURAPREP 26ML APPLICATOR (WOUND CARE) ×3 IMPLANT
ELECT REM PT RETURN 9FT ADLT (ELECTROSURGICAL) ×3
ELECTRODE REM PT RTRN 9FT ADLT (ELECTROSURGICAL) ×1 IMPLANT
GAUZE SPONGE 4X4 12PLY STRL (GAUZE/BANDAGES/DRESSINGS) ×3 IMPLANT
GLOVE BIOGEL M STRL SZ7.5 (GLOVE) ×9 IMPLANT
GLOVE INDICATOR 8.0 STRL GRN (GLOVE) ×9 IMPLANT
GOWN STRL REUS W/ TWL LRG LVL3 (GOWN DISPOSABLE) ×3 IMPLANT
GOWN STRL REUS W/TWL LRG LVL3 (GOWN DISPOSABLE) ×6
GUIDEWIRE 3.2X400 (WIRE) ×6 IMPLANT
KIT TURNOVER CYSTO (KITS) ×3 IMPLANT
MAT BLUE FLOOR 46X72 FLO (MISCELLANEOUS) ×3 IMPLANT
NAIL TROCHANTERIC 11MM 130D (Nail) ×3 IMPLANT
NS IRRIG 1000ML POUR BTL (IV SOLUTION) ×3 IMPLANT
PACK HIP COMPR (MISCELLANEOUS) ×3 IMPLANT
REAMER ROD DEEP FLUTE 2.5X950 (INSTRUMENTS) ×3 IMPLANT
SCREW LOCK TI 5.0X46 F/IM NAIL (Screw) ×3 IMPLANT
SCREW LOCK TI 5.0X48 F/IM NAIL (Screw) ×3 IMPLANT
SOL PREP PVP 2OZ (MISCELLANEOUS) ×3
SOLUTION PREP PVP 2OZ (MISCELLANEOUS) ×1 IMPLANT
STAPLER SKIN PROX 35W (STAPLE) ×3 IMPLANT
SUCTION FRAZIER HANDLE 10FR (MISCELLANEOUS) ×2
SUCTION TUBE FRAZIER 10FR DISP (MISCELLANEOUS) ×1 IMPLANT
SUT VIC AB 0 CT1 36 (SUTURE) ×3 IMPLANT
SUT VIC AB 1 CT1 36 (SUTURE) ×3 IMPLANT
SUT VIC AB 2-0 CT1 27 (SUTURE) ×2
SUT VIC AB 2-0 CT1 TAPERPNT 27 (SUTURE) ×1 IMPLANT
TAPE MICROFOAM 4IN (TAPE) ×3 IMPLANT

## 2017-06-21 NOTE — Transfer of Care (Signed)
Immediate Anesthesia Transfer of Care Note  Patient: Lisa [name]  Procedure(s) Performed: INTRAMEDULLARY (IM) NAIL INTERTROCHANTRIC (Right Hip)  Patient Location: PACU  Anesthesia Type:General  Level of Consciousness: sedated  Airway & Oxygen Therapy: Patient connected to face mask oxygen  Post-op Assessment: Post -op Vital signs reviewed and stable  Post vital signs: stable  Last Vitals:  Vitals Value Taken Time  BP 127/49 06/21/2017  9:18 PM  Temp 35.6 C 06/21/2017  9:17 PM  Pulse 56 06/21/2017  9:22 PM  Resp 13 06/21/2017  9:22 PM  SpO2 100 % 06/21/2017  9:22 PM  Vitals shown include unvalidated device data.  Last Pain:  Vitals:   06/21/17 2117  TempSrc: Temporal  PainSc:          Complications: No apparent anesthesia complications

## 2017-06-21 NOTE — Op Note (Signed)
OPERATIVE NOTE  DATE OF SURGERY:  06/21/2017  PATIENT NAME:  Lisa [name]   DOB: [birth year]  MRN: [id number]  PRE-OPERATIVE DIAGNOSIS: Right pertrochanteric femur fracture  POST-OPERATIVE DIAGNOSIS:  Same  PROCEDURE: Open reduction and internal fixation of a right pertrochanteric femur fracture   SURGEON:  Marciano Sequin. M.D.  ANESTHESIA: general  ESTIMATED BLOOD LOSS: 50 mL  FLUIDS REPLACED: 700 mL of crystalloid  DRAINS: None  IMPLANTS UTILIZED: Synthes 11  X 380 mm/130 trochanteric fixation nail, 90 mm helical blade, 46 mm x 5.0 mm and 48 mm x 5 mm locking screws  INDICATIONS FOR SURGERY: Lisa [name] is a 84 y.o. year old female who fell and sustained a displaced right pertrochanteric femur fracture. After discussion of the risks and benefits of surgical intervention, the patient expressed understanding of the risks benefits and agree with plans for open reduction and internal fixation.   The risks, benefits, and alternatives were discussed at length including but not limited to the risks of infection, bleeding, nerve injury, stiffness, blood clots, the need for revision surgery, limb length inequality, cardiopulmonary complications, among others, and they were willing to proceed.  PROCEDURE IN DETAIL: The patient was brought into the operating room and, after adequate general anesthesia was achieved, patient was placed on the fracture table. All bony prominences were well-padded. The right lower extremity was placed in traction and a provisional reduction was performed and verified using the C-arm. The patient's right hip and leg were cleaned and prepped with alcohol and DuraPrep and draped in the usual sterile fashion. A "timeout" was performed as per usual protocol. A lateral incision was made extended from the proximal portion of the greater trochanter proximally. The fascia was incised in line with the skin incision and the fibers of the hip abductors were split in  line. The tip of the greater trochanter was palpated and a distally threaded guide pin was inserted into the tip of the greater trochanter and advanced into the medullary canal. Position was confirmed in both AP and lateral planes using the C-arm. A pilot hole was enlarged using a step drill.  The short guidewire was then exchanged with a long guidewire for reaming purposes.  The medullary canal was reamed in a sequential fashion up to a 12 mm diameter.  Measurements were obtained and it was felt that a 380 mm long female was appropriate.  A Synthes 11 mm x 380 mm/130 trochanteric fixation nail was advanced over the guide wire and position confirmed using the C-arm. A second stab incision was made and the tissue protector was inserted through the outrigger device and advanced to the lateral cortex of the femur. A threaded screw guide pin was inserted into the femoral neck and head and position was again confirmed in both AP and lateral planes. Vision was were obtained and it was felt that a 90 mm helical blade was appropriate. The cortex was reamed and then a cannulated reamer was advanced over the guidepin to the appropriate depth. A 90 mm helical blade was then advanced over the guidepin and impacted into place. Good position was noted in multiple planes using the C-arm. The locking sleeve was engaged. Finally, the C-arm was positioned so as to visualize the holes for the 2 distal locking screws.  Stab incisions were made and first a 48 mm x 5 mm locking screw was inserted through the oblong hole followed by placement of a 46 mm x 5.0 mm locking screw.  Position  was confirmed in multiple planes using the C arm. The hip was visualized in all planes using the C-arm with good reduction appreciated and good position of the hardware noted.  The wound was irrigated with copious amounts of normal saline with antibiotic solution and suctioned dry. Good hemostasis was appreciated. The fascia was reapproximated using  interrupted sutures of #1 Vicryl. Subcutaneous tissue was approximated layers using first #0 Vicryl followed #2-0 Vicryl. The skin was closed with skin staples. A sterile dressing was applied.  The patient tolerated the procedure well and was transported to the recovery room in stable condition.   Marciano Sequin., M.D.

## 2017-06-21 NOTE — Anesthesia Postprocedure Evaluation (Signed)
Anesthesia Post Note  Patient: Lisa [name]  Procedure(s) Performed: INTRAMEDULLARY (IM) NAIL INTERTROCHANTRIC (Right Hip)  Patient location during evaluation: PACU Anesthesia Type: General Level of consciousness: awake and alert Pain management: pain level controlled Vital Signs Assessment: post-procedure vital signs reviewed and stable Respiratory status: spontaneous breathing and respiratory function stable Cardiovascular status: stable Anesthetic complications: no     Last Vitals:  Vitals:   06/21/17 0812 06/21/17 2117  BP: (!) 143/52 (!) 127/49  Pulse: 70 62  Resp: 18 16  Temp: 36.6 C (!) 35.6 C  SpO2: 96% 100%    Last Pain:  Vitals:   06/21/17 2117  TempSrc: Temporal  PainSc: Asleep                 Safaa Stingley K

## 2017-06-21 NOTE — Anesthesia Procedure Notes (Addendum)
Procedure Name: Intubation Date/Time: 06/21/2017 6:55 PM Performed by: Dionne Bucy, CRNA Pre-anesthesia Checklist: Patient identified, Patient being monitored, Timeout performed, Emergency Drugs available and Suction available Patient Re-evaluated:Patient Re-evaluated prior to induction Oxygen Delivery Method: Circle system utilized Preoxygenation: Pre-oxygenation with 100% oxygen Induction Type: IV induction Ventilation: Mask ventilation without difficulty Laryngoscope Size: Mac and 3 Grade View: Grade I Tube type: Oral Tube size: 7.0 mm Number of attempts: 1 Airway Equipment and Method: Stylet Placement Confirmation: ETT inserted through vocal cords under direct vision,  positive ETCO2 and breath sounds checked- equal and bilateral Secured at: 21 cm Tube secured with: Tape Dental Injury: Teeth and Oropharynx as per pre-operative assessment

## 2017-06-21 NOTE — Progress Notes (Signed)
[facility] [place] NAME: Lisa [name]    MR#:  [id number]  DATE OF BIRTH:  May 04, 1928  SUBJECTIVE:  CHIEF COMPLAINT:   Chief Complaint  Patient presents with  . Fall   Sill right hip pain, exacerbated by mild movement. REVIEW OF SYSTEMS:  Review of Systems  Constitutional: Negative for chills, fever and malaise/fatigue.  HENT: Negative for sore throat.   Eyes: Negative for blurred vision and double vision.  Respiratory: Negative for cough, hemoptysis, shortness of breath, wheezing and stridor.   Cardiovascular: Negative for chest pain, palpitations, orthopnea and leg swelling.  Gastrointestinal: Negative for abdominal pain, blood in stool, diarrhea, melena, nausea and vomiting.  Genitourinary: Negative for dysuria, flank pain and hematuria.  Musculoskeletal: Positive for joint pain. Negative for back pain.  Skin: Negative for rash.  Neurological: Negative for dizziness, sensory change, focal weakness, seizures, loss of consciousness, weakness and headaches.  Endo/Heme/Allergies: Negative for polydipsia.  Psychiatric/Behavioral: Negative for depression. The patient is not nervous/anxious.     DRUG ALLERGIES:   Allergies  Allergen Reactions  . Ace Inhibitors Cough  . Benadryl [Diphenhydramine Hcl (Sleep)]   . Diphenhydramine-Apap (Sleep) Other (See Comments)    Reaction: Couldn't use kidneys  . Tylenol Pm Extra [Diphenhydramine-Apap (Sleep)]     Reaction: Couldn't use kidneys   VITALS:  Blood pressure (!) 143/52, pulse 70, temperature 97.8 F (36.6 C), temperature source Oral, resp. rate 18, height 5' 2.5" (1.588 m), weight 124 lb (56.2 kg), SpO2 96 %. PHYSICAL EXAMINATION:  Physical Exam  Constitutional: She is oriented to person, place, and time and well-developed, well-nourished, and in no distress.  HENT:  Head: Normocephalic.  Mouth/Throat: Oropharynx is clear and moist.  Eyes: Pupils are equal, round, and  reactive to light. Conjunctivae and EOM are normal. No scleral icterus.  Neck: Normal range of motion. Neck supple. No JVD present. No tracheal deviation present.  Cardiovascular: Normal rate, regular rhythm and normal heart sounds. Exam reveals no gallop.  No murmur heard. Pulmonary/Chest: Effort normal and breath sounds normal. No respiratory distress. She has no wheezes. She has no rales.  Abdominal: Soft. Bowel sounds are normal. She exhibits no distension. There is no tenderness. There is no rebound.  Musculoskeletal: She exhibits no edema or tenderness.  right lower extremity deformity.  Neurological: She is alert and oriented to person, place, and time. No cranial nerve deficit.  Skin: No rash noted. No erythema.  Psychiatric: Affect normal.   LABORATORY PANEL:  Female CBC Recent Labs  Lab 06/20/17 0449  WBC 10.7  HGB 11.6*  HCT 35.4  PLT 155   ------------------------------------------------------------------------------------------------------------------ Chemistries  Recent Labs  Lab 06/20/17 0449  NA 137  K 4.2  CL 104  CO2 25  GLUCOSE 111*  BUN 23*  CREATININE 0.76  CALCIUM 8.9   RADIOLOGY:  No results found. ASSESSMENT AND PLAN:   Lisa [name]  is a 84 y.o. female with a known history of paroxysmal atrial fibrillation on eliquis, hypertension, hyperlipidemia who lives independently at home was brought in secondary to a fall and right femoral intertrochanteric fracture.  1. Right Hip intertrochanteric fracture- secondary to mechanical fall. Pain control, hold eliquis. -okay to proceed with right hip surgery from cardiac standpoint.  Follow-up with Dr. Marry Guan for hip surgery today. -Physical therapy after surgery.  2.  Paroxysmal atrial fibrillation- currently in sinus rhythm On low dose metoprolol - eliquis on hold, may resume after surgery.  3. Hypertension- on metoprolol  and losartan  4. DVT prophylaxis- none until the surgery  Mild  dehydration.  Encourage oral intake.  Start IV normal saline.  All the records are reviewed and case discussed with Care Management/Social Worker. Management plans discussed with the patient, her daughter and they are in agreement.  CODE STATUS: Full Code  TOTAL TIME TAKING CARE OF THIS PATIENT: 33 minutes.   More than 50% of the time was spent in counseling/coordination of care: YES  POSSIBLE D/C IN 2 DAYS, DEPENDING ON CLINICAL CONDITION.   Demetrios Loll M.D on 06/21/2017 at 2:26 PM  Between 7am to 6pm - Pager - [phone]  After 6pm go to [email or web address] - [facility]

## 2017-06-21 NOTE — Anesthesia Preprocedure Evaluation (Signed)
Anesthesia Evaluation  Patient identified by MRN, date of birth, ID band Patient awake    Reviewed: Allergy & Precautions, NPO status , Patient's Chart, lab work & pertinent test results  History of Anesthesia Complications Negative for: history of anesthetic complications  Airway Mallampati: II       Dental   Pulmonary neg sleep apnea, neg COPD,           Cardiovascular hypertension, Pt. on medications (-) Past MI + dysrhythmias (hx of A-fib) Atrial Fibrillation + Valvular Problems/Murmurs (murmur, no tx)      Neuro/Psych neg Seizures    GI/Hepatic Neg liver ROS, neg GERD  ,  Endo/Other  neg diabetes  Renal/GU negative Renal ROS     Musculoskeletal   Abdominal   Peds  Hematology  (+) anemia ,   Anesthesia Other Findings   Reproductive/Obstetrics                             Anesthesia Physical Anesthesia Plan  ASA: III  Anesthesia Plan: General   Post-op Pain Management:    Induction:   PONV Risk Score and Plan: 3 and Ondansetron, Dexamethasone and Midazolam  Airway Management Planned: Oral ETT  Additional Equipment:   Intra-op Plan:   Post-operative Plan:   Informed Consent: I have reviewed the patients History and Physical, chart, labs and discussed the procedure including the risks, benefits and alternatives for the proposed anesthesia with the patient or authorized representative who has indicated his/her understanding and acceptance.     Plan Discussed with:   Anesthesia Plan Comments:         Anesthesia Quick Evaluation

## 2017-06-21 NOTE — Anesthesia Post-op Follow-up Note (Signed)
Anesthesia QCDR form completed.        

## 2017-06-22 ENCOUNTER — Encounter: Payer: Self-pay | Admitting: Orthopedic Surgery

## 2017-06-22 ENCOUNTER — Ambulatory Visit: Payer: Medicare Other

## 2017-06-22 ENCOUNTER — Encounter
Admission: RE | Admit: 2017-06-22 | Discharge: 2017-06-22 | Disposition: A | Discharge status: Home / Self Care | Payer: Medicare Other | Source: Ambulatory Visit | Attending: Internal Medicine | Admitting: Internal Medicine

## 2017-06-22 LAB — BASIC METABOLIC PANEL
Anion gap: 6 (ref 5–15)
BUN: 22 mg/dL — ABNORMAL HIGH (ref 6–20)
CHLORIDE: 105 mmol/L (ref 101–111)
CO2: 25 mmol/L (ref 22–32)
CREATININE: 0.92 mg/dL (ref 0.44–1.00)
Calcium: 7.9 mg/dL — ABNORMAL LOW (ref 8.9–10.3)
GFR calc non Af Amer: 55 mL/min — ABNORMAL LOW (ref >60)
Glucose, Bld: 116 mg/dL — ABNORMAL HIGH (ref 65–99)
Potassium: 3.9 mmol/L (ref 3.5–5.1)
SODIUM: 136 mmol/L (ref 135–145)

## 2017-06-22 LAB — CBC
HEMATOCRIT: 26.6 % — AB (ref 35.0–47.0)
HEMOGLOBIN: 8.5 g/dL — AB (ref 12.0–16.0)
MCH: 29.3 pg (ref 26.0–34.0)
MCHC: 32.1 g/dL (ref 32.0–36.0)
MCV: 91.3 fL (ref 80.0–100.0)
Platelets: 117 10*3/uL — ABNORMAL LOW (ref 150–440)
RBC: 2.91 MIL/uL — AB (ref 3.80–5.20)
RDW: 16.9 % — ABNORMAL HIGH (ref 11.5–14.5)
WBC: 8.4 10*3/uL (ref 3.6–11.0)

## 2017-06-22 NOTE — Progress Notes (Signed)
Patient is A+O with no signs of distress. Pain is well managed with current medications.  Will d/c foley in am

## 2017-06-22 NOTE — Progress Notes (Signed)
[facility] [place] NAME: Lisa [name]    MR#:  [id number]  DATE OF BIRTH:  [birth year]  SUBJECTIVE:  CHIEF COMPLAINT:   Chief Complaint  Patient presents with  . Fall   - complains of right hip pain and weakness - hb drop after surgery to 8.5  REVIEW OF SYSTEMS:  Review of Systems  Constitutional: Negative for chills, fever and malaise/fatigue.  HENT: Negative for congestion, ear discharge, hearing loss and nosebleeds.   Eyes: Negative for blurred vision and double vision.  Respiratory: Negative for cough, shortness of breath and wheezing.   Cardiovascular: Negative for chest pain and palpitations.  Gastrointestinal: Negative for abdominal pain, constipation, diarrhea, nausea and vomiting.  Genitourinary: Negative for dysuria.  Musculoskeletal: Positive for joint pain and myalgias.  Neurological: Negative for dizziness, focal weakness, seizures, weakness and headaches.  Psychiatric/Behavioral: Negative for depression.    DRUG ALLERGIES:   Allergies  Allergen Reactions  . Ace Inhibitors Cough  . Benadryl [Diphenhydramine Hcl (Sleep)]   . Diphenhydramine-Apap (Sleep) Other (See Comments)    Reaction: Couldn't use kidneys  . Tylenol Pm Extra [Diphenhydramine-Apap (Sleep)]     Reaction: Couldn't use kidneys    VITALS:  Blood pressure (!) 108/55, pulse 68, temperature (!) 97.5 F (36.4 C), temperature source Oral, resp. rate 18, height 5' 2.5" (1.588 m), weight 56.2 kg (124 lb), SpO2 98 %.  PHYSICAL EXAMINATION:  Physical Exam   GENERAL:  84 y.o.-year-old patient lying in the bed with no acute distress.  EYES: Pupils equal, round, reactive to light and accommodation. No scleral icterus. Extraocular muscles intact.  HEENT: Head atraumatic, normocephalic. Oropharynx and nasopharynx clear.  NECK:  Supple, no jugular venous distention. No thyroid enlargement, no tenderness.  LUNGS: Normal breath sounds bilaterally, no  wheezing, rales,rhonchi or crepitation. No use of accessory muscles of respiration.  CARDIOVASCULAR: S1, S2 normal. No  rubs, or gallops. 2/6 systolic murmur present ABDOMEN: Soft, nontender, nondistended. Bowel sounds present. No organomegaly or mass.  EXTREMITIES: No pedal edema, cyanosis, or clubbing. Right hip pain and dressing in place NEUROLOGIC: Cranial nerves II through XII are intact. Muscle strength 5/5 in all extremities. Sensation intact. Gait not checked.  PSYCHIATRIC: The patient is alert and oriented x 3.  SKIN: No obvious rash, lesion, or ulcer.    LABORATORY PANEL:   CBC Recent Labs  Lab 06/22/17 0504  WBC 8.4  HGB 8.5*  HCT 26.6*  PLT 117*   ------------------------------------------------------------------------------------------------------------------  Chemistries  Recent Labs  Lab 06/22/17 0504  NA 136  K 3.9  CL 105  CO2 25  GLUCOSE 116*  BUN 22*  CREATININE 0.92  CALCIUM 7.9*   ------------------------------------------------------------------------------------------------------------------  Cardiac Enzymes No results for input(s): TROPONINI in the last 168 hours. ------------------------------------------------------------------------------------------------------------------  RADIOLOGY:  Dg C-arm 1-60 Min  Result Date: 06/21/2017 CLINICAL DATA:  Femur fracture EXAM: DG C-ARM 61-120 MIN; RIGHT FEMUR 2 VIEWS COMPARISON:  06/19/2017 FINDINGS: Five low resolution intraoperative spot views of the right femur. Total fluoroscopy time was 1 minutes 48 seconds. Images demonstrate intramedullary rodding and screw fixation of the right femur for intertrochanteric fracture. IMPRESSION: Fluoroscopic assistance provided during surgical fixation of proximal right femur fracture Electronically Signed   By: Donavan Foil M.D.   On: 06/21/2017 21:23   Dg Femur, Min 2 Views Right  Result Date: 06/21/2017 CLINICAL DATA:  Femur fracture EXAM: DG C-ARM 61-120 MIN;  RIGHT FEMUR 2 VIEWS COMPARISON:  06/19/2017 FINDINGS: Five low resolution intraoperative  spot views of the right femur. Total fluoroscopy time was 1 minutes 48 seconds. Images demonstrate intramedullary rodding and screw fixation of the right femur for intertrochanteric fracture. IMPRESSION: Fluoroscopic assistance provided during surgical fixation of proximal right femur fracture Electronically Signed   By: Donavan Foil M.D.   On: 06/21/2017 21:23    EKG:   Orders placed or performed during the hospital encounter of 06/19/17  . ED EKG  . ED EKG    ASSESSMENT AND PLAN:   Lisa [name]  is a 84 y.o. female with a known history of paroxysmal atrial fibrillation on eliquis, hypertension, hyperlipidemia who lives independently at home was brought in secondary to a fall and right femoral intertrochanteric fracture.  1. Right Hip intertrochanteric fracture- secondary to mechanical fall. - s/p POD # 1, intertrochanteric intramedullary nail placement -Appreciate orthopedic consult. Pain control -to work with physical therapy today  2.Acute post op anemia-no indication for transfusion yet Monitor hb, at 8.5 No active bleeding  3.  Paroxysmal atrial fibrillation- currently in sinus rhythm On low dose metoprolol - eliquis restarted at a low dose  3. Hypertension- on metoprolol and losartan  4. DVT prophylaxis- on eliquis   Physical Therapy consulted    All the records are reviewed and case discussed with Care Management/Social Workerr. Management plans discussed with the patient, family and they are in agreement.  CODE STATUS:  Full Code  TOTAL TIME TAKING CARE OF THIS PATIENT: 38 minutes.   POSSIBLE D/C IN 1-2 DAYS, DEPENDING ON CLINICAL CONDITION.   Gladstone Lighter M.D on 06/22/2017 at 1:04 PM  Between 7am to 6pm - Pager - [phone]  After 6pm go to [email or web address] - password [email or web address] [facility]  Office  [phone]  CC: Primary care  physician; Marinda Elk, MD

## 2017-06-22 NOTE — Progress Notes (Signed)
Physical Therapy Treatment Patient Details Name: ALOHILANI LEVENHAGEN MRN: [id number] DOB: 02-Jun-1928 Today's Date: 06/22/2017    History of Present Illness 84 y/o female s/p fall with R hip fx, s/p ORIF nailing 3/27.    PT Comments    Pt showed good effort with PT session this afternoon.  She was able to ambulate more than 2x the distance from exam this AM, but had more pronounced R limp with hip&knee somewhat giving-way during stance phase and heavy reliance on the walker.  She did not need excessive assist but did not move as well as she did this AM regarding quality of gait.  Pt did show increased tolerance and strength with supine exercises and generally tolerated them well.  Pt eager to go to rehab and work toward getting back home independently.   Follow Up Recommendations  SNF     Equipment Recommendations       Recommendations for Other Services       Precautions / Restrictions Precautions Precautions: Fall Restrictions Weight Bearing Restrictions: Yes RLE Weight Bearing: Weight bearing as tolerated    Mobility  Bed Mobility Overal bed mobility: Needs Assistance Bed Mobility: Sit to Supine     Supine to sit: Min assist Sit to supine: Mod assist   General bed mobility comments: assist to get R LE back into bed, but ultimately good effort and execution  Transfers Overall transfer level: Needs assistance Equipment used: Rolling walker (2 wheeled) Transfers: Sit to/from Stand Sit to Stand: Min guard         General transfer comment: Pt again needing cuing to use UEs appropriate and to shitf weight forward.  She ultimately did not need direct physical assist to get to standing  Ambulation/Gait Ambulation/Gait assistance: Min assist Ambulation Distance (Feet): 40 Feet Assistive device: Rolling walker (2 wheeled)       General Gait Details: Pt again slow and cautious but not needing a lot of direct assist.  She did have a more pronounced limp on R with hip and  knee "buckling" weakness.  She did not have any overt LOBs, but was very reliant on the walker and lacked confidence with steppage.   Stairs            Wheelchair Mobility    Modified Rankin (Stroke Patients Only)       Balance Overall balance assessment: Needs assistance Sitting-balance support: Bilateral upper extremity supported Sitting balance-Leahy Scale: Good     Standing balance support: Bilateral upper extremity supported Standing balance-Leahy Scale: Fair                              Cognition Arousal/Alertness: Awake/alert Behavior During Therapy: WFL for tasks assessed/performed Overall Cognitive Status: Within Functional Limits for tasks assessed                                        Exercises General Exercises - Lower Extremity Ankle Circles/Pumps: AROM;10 reps Quad Sets: Strengthening;10 reps Gluteal Sets: Strengthening;10 reps Short Arc Quad: AROM;15 reps Heel Slides: AROM;10 reps Hip ABduction/ADduction: Strengthening;10 reps Straight Leg Raises: AAROM;AROM;10 reps(Pt able to minimally lift R LE w/o assist on last 2 reps)    General Comments        Pertinent Vitals/Pain Pain Assessment: 0-10 Pain Score: 3 (increased with activity, but never functionally limiting`) Pain Location: R hip  Home Living Family/patient expects to be discharged to:: Skilled nursing facility Living Arrangements: Alone                  Prior Function Level of Independence: Independent with assistive device(s)      Comments: Pt had been using walker s/p kypho last year until recently, still was needing regular crusing technique for balance   PT Goals (current goals can now be found in the care plan section) Acute Rehab PT Goals Patient Stated Goal: get back to home PT Goal Formulation: With patient/family Time For Goal Achievement: 07/06/17 Potential to Achieve Goals: Good Progress towards PT goals: Progressing toward  goals    Frequency    BID      PT Plan Current plan remains appropriate    Co-evaluation              AM-PAC PT "6 Clicks" Daily Activity  Outcome Measure  Difficulty turning over in bed (including adjusting bedclothes, sheets and blankets)?: A Little Difficulty moving from lying on back to sitting on the side of the bed? : Unable Difficulty sitting down on and standing up from a chair with arms (e.g., wheelchair, bedside commode, etc,.)?: A Little Help needed moving to and from a bed to chair (including a wheelchair)?: A Little Help needed walking in hospital room?: A Little Help needed climbing 3-5 steps with a railing? : A Lot 6 Click Score: 15    End of Session Equipment Utilized During Treatment: Gait belt Activity Tolerance: Patient limited by fatigue;Patient tolerated treatment well Patient left: with call bell/phone within reach;with family/visitor present;with bed alarm set Nurse Communication: Mobility status PT Visit Diagnosis: Muscle weakness (generalized) (M62.81);Difficulty in walking, not elsewhere classified (R26.2)     Time: 9381-0175 PT Time Calculation (min) (ACUTE ONLY): 26 min  Charges:  $Gait Training: 8-22 mins $Therapeutic Exercise: 8-22 mins                    G Codes:       Kreg Shropshire, DPT 06/22/2017, 3:12 PM

## 2017-06-22 NOTE — Evaluation (Signed)
Physical Therapy Evaluation Patient Details Name: Lisa [name] MRN: [id number] DOB: 1928/07/15 Today's Date: 06/22/2017   History of Present Illness  84 y/o female s/p fall with R hip fx, s/p ORIF nailing 3/27.  Clinical Impression  Pt initially hesitant about what she will be able to do with PT, but overall she did well for first session POD1.  She is weak t/o R LE but was able to participate relatively well with ~13 minutes of exercises apart from the exam and she was also able to walk ~15 ft and generally did better than she expected, though she will still need rehab on d/c.  She is somewhat pain limited but again did relatively well.    Follow Up Recommendations SNF    Equipment Recommendations       Recommendations for Other Services       Precautions / Restrictions Precautions Precautions: Fall Restrictions Weight Bearing Restrictions: Yes RLE Weight Bearing: Weight bearing as tolerated      Mobility  Bed Mobility Overal bed mobility: Needs Assistance Bed Mobility: Supine to Sit     Supine to sit: Min assist     General bed mobility comments: Pt showed good effort in getting to EOB, moderate rail use, light assist with LEs to EOB  Transfers Overall transfer level: Needs assistance Equipment used: Rolling walker (2 wheeled) Transfers: Sit to/from Stand Sit to Stand: Min assist         General transfer comment: a lot of cuing for appropriate set up/UE use but ultimatley pt did not need heavy assist to assume standing posture  Ambulation/Gait Ambulation/Gait assistance: Min assist Ambulation Distance (Feet): 15 Feet Assistive device: Rolling walker (2 wheeled)       General Gait Details: Pt with very slow, hesitant steps.  Cautious with R WBing initially though she did improve this as she increased distance  Financial trader Rankin (Stroke Patients Only)       Balance Overall balance assessment: Needs  assistance Sitting-balance support: Bilateral upper extremity supported Sitting balance-Leahy Scale: Good     Standing balance support: Bilateral upper extremity supported Standing balance-Leahy Scale: Fair                               Pertinent Vitals/Pain Pain Assessment: 0-10 Pain Score: 5  Pain Location: R hip    Home Living Family/patient expects to be discharged to:: Skilled nursing facility Living Arrangements: Alone                    Prior Function Level of Independence: Independent with assistive device(s)         Comments: Pt had been using walker s/p kypho last year until recently, still was needing regular crusing technique for balance     Hand Dominance        Extremity/Trunk Assessment   Upper Extremity Assessment Upper Extremity Assessment: Generalized weakness    Lower Extremity Assessment Lower Extremity Assessment: Generalized weakness(expected post-op weakness on R)       Communication   Communication: HOH  Cognition Arousal/Alertness: Awake/alert Behavior During Therapy: WFL for tasks assessed/performed Overall Cognitive Status: Within Functional Limits for tasks assessed  General Comments      Exercises General Exercises - Lower Extremity Ankle Circles/Pumps: AROM;10 reps Quad Sets: Strengthening;10 reps Gluteal Sets: Strengthening;10 reps Short Arc Quad: AROM;10 reps Heel Slides: AAROM;AROM;10 reps Hip ABduction/ADduction: 10 reps;AROM Straight Leg Raises: (unable)   Assessment/Plan    PT Assessment Patient needs continued PT services  PT Problem List Decreased strength;Decreased range of motion;Decreased activity tolerance;Decreased balance;Decreased mobility;Decreased coordination;Decreased knowledge of use of DME;Decreased cognition;Decreased safety awareness;Decreased knowledge of precautions;Pain       PT Treatment Interventions Gait  training;DME instruction;Stair training;Therapeutic activities;Functional mobility training;Therapeutic exercise;Balance training;Neuromuscular re-education;Patient/family education    PT Goals (Current goals can be found in the Care Plan section)  Acute Rehab PT Goals Patient Stated Goal: get back to home PT Goal Formulation: With patient/family Time For Goal Achievement: 07/06/17 Potential to Achieve Goals: Good    Frequency BID   Barriers to discharge        Co-evaluation               AM-PAC PT "6 Clicks" Daily Activity  Outcome Measure Difficulty turning over in bed (including adjusting bedclothes, sheets and blankets)?: A Little Difficulty moving from lying on back to sitting on the side of the bed? : Unable Difficulty sitting down on and standing up from a chair with arms (e.g., wheelchair, bedside commode, etc,.)?: Unable Help needed moving to and from a bed to chair (including a wheelchair)?: A Little Help needed walking in hospital room?: A Lot Help needed climbing 3-5 steps with a railing? : A Lot 6 Click Score: 12    End of Session Equipment Utilized During Treatment: Gait belt Activity Tolerance: Patient limited by fatigue;Patient tolerated treatment well Patient left: with chair alarm set;with call bell/phone within reach;with family/visitor present Nurse Communication: Mobility status PT Visit Diagnosis: Muscle weakness (generalized) (M62.81);Difficulty in walking, not elsewhere classified (R26.2)    Time: 0925-1000 PT Time Calculation (min) (ACUTE ONLY): 35 min   Charges:   PT Evaluation $PT Eval Low Complexity: 1 Low PT Treatments $Therapeutic Exercise: 8-22 mins   PT G Codes:        Kreg Shropshire, DPT 06/22/2017, 1:43 PM

## 2017-06-22 NOTE — Progress Notes (Signed)
Plan is for patient to D/C to [facility] pending [facility]F authorization.   [employer], LCSW [phone]

## 2017-06-22 NOTE — Care Management Important Message (Signed)
Important Message  Patient Details  Name: Lisa [name] MRN: [id number] Date of Birth: 09/01/1928   Medicare Important Message Given:  Yes    Juliann Pulse A Kataleyah Carducci 06/22/2017, 2:14 PM

## 2017-06-22 NOTE — Progress Notes (Signed)
   Subjective: 1 Day Post-Op Procedure(s) (LRB): INTRAMEDULLARY (IM) NAIL INTERTROCHANTRIC (Right) Patient reports pain as mild.   Patient is well, and has had no acute complaints or problems We will start therapy today.  Plan is to go Rehab after hospital stay. no nausea and no vomiting Patient denies any chest pains or shortness of breath. She states that she rested very well during the night.  Voicing no complaints  Objective: Vital signs in last 24 hours: Temp:  [96.1 F (35.6 C)-98.6 F (37 C)] 97.5 F (36.4 C) (03/28 0403) Pulse Rate:  [56-79] 65 (03/28 0403) Resp:  [13-18] 16 (03/28 0403) BP: (111-150)/(49-95) 111/51 (03/28 0403) SpO2:  [93 %-100 %] 98 % (03/28 0403) well approximated incision Heels are non tender and elevated off the bed using rolled towels Intake/Output from previous day: 03/27 0701 - 03/28 0700 In: 2250 [P.O.:480; I.V.:1570; IV Piggyback:200] Out: 650 [Urine:600; Blood:50] Intake/Output this shift: No intake/output data recorded.  Recent Labs    06/19/17 1456 06/20/17 0449 06/22/17 0504  HGB 13.5 11.6* 8.5*   Recent Labs    06/20/17 0449 06/22/17 0504  WBC 10.7 8.4  RBC 3.90 2.91*  HCT 35.4 26.6*  PLT 155 117*   Recent Labs    06/20/17 0449 06/22/17 0504  NA 137 136  K 4.2 3.9  CL 104 105  CO2 25 25  BUN 23* 22*  CREATININE 0.76 0.92  GLUCOSE 111* 116*  CALCIUM 8.9 7.9*   Recent Labs    06/19/17 1456  INR 1.09    EXAM General - Patient is Alert, Appropriate and Oriented Extremity - Neurologically intact Neurovascular intact Sensation intact distally Intact pulses distally Dorsiflexion/Plantar flexion intact No cellulitis present Compartment soft Dressing - dressing C/D/I Motor Function - intact, moving foot and toes well on exam.  Patient able to move and do straight leg raise with some soreness  Past Medical History:  Diagnosis Date  . A-fib ([place])   . Basal cell carcinoma   . Constipation   . Cystocele   .  Hyperlipemia   . Hypertension   . Incomplete bladder emptying   . Menopausal state   . Urethral caruncle   . Urinary urgency     Assessment/Plan: 1 Day Post-Op Procedure(s) (LRB): INTRAMEDULLARY (IM) NAIL INTERTROCHANTRIC (Right) Active Problems:   Closed right hip fracture (HCC)   Malnutrition of moderate degree  Estimated body mass index is 22.32 kg/m as calculated from the following:   Height as of this encounter: 5' 2.5" (1.588 m).   Weight as of this encounter: 56.2 kg (124 lb). Advance diet Up with therapy D/C IV fluids Plan for discharge tomorrow Discharge to SNF  Labs: Were reviewed. DVT Prophylaxis - Lovenox, Foot Pumps and TED hose Patient may foot flat weightbearing and advance as tolerated D/C O2 and Pulse OX and try on Room Air Begin working on bowel movement We will continue Lovenox 2 weeks postop TED stockings will need to be continued for 6 weeks. We will need to follow-up in [place] in 6 weeks.  Sooner if any problems Stable will need to be removed 2 weeks postop followed by the application of benzoin and Steri-Strips  Jon R. [place] Wadsworth 06/22/2017, 7:02 AM

## 2017-06-23 LAB — CBC
HCT: 24.1 % — ABNORMAL LOW (ref 35.0–47.0)
Hemoglobin: 7.8 g/dL — ABNORMAL LOW (ref 12.0–16.0)
MCH: 29.7 pg (ref 26.0–34.0)
MCHC: 32.5 g/dL (ref 32.0–36.0)
MCV: 91.5 fL (ref 80.0–100.0)
Platelets: 124 10*3/uL — ABNORMAL LOW (ref 150–440)
RBC: 2.64 MIL/uL — ABNORMAL LOW (ref 3.80–5.20)
RDW: 16.7 % — AB (ref 11.5–14.5)
WBC: 9 10*3/uL (ref 3.6–11.0)

## 2017-06-23 MED ORDER — OXYCODONE HCL 5 MG PO TABS: 5.0000 mg | ORAL_TABLET | Freq: Four times a day (QID) | ORAL | 0 refills | Status: DC | PRN

## 2017-06-23 MED ORDER — FERROUS SULFATE 325 (65 FE) MG PO TABS: 325.0000 mg | ORAL_TABLET | Freq: Two times a day (BID) | ORAL | 0 refills | Status: DC

## 2017-06-23 MED ORDER — POLYETHYLENE GLYCOL 3350 17 G PO PACK: 17.0000 g | PACK | Freq: Every day | ORAL | 0 refills | Status: DC | PRN

## 2017-06-23 MED ORDER — ADULT MULTIVITAMIN W/MINERALS CH: 1.0000 | ORAL_TABLET | Freq: Every day | ORAL | 2 refills | Status: AC

## 2017-06-23 MED ORDER — ENSURE ENLIVE PO LIQD: 237.0000 mL | Freq: Two times a day (BID) | ORAL | 12 refills | Status: DC

## 2017-06-23 MED ORDER — OXYCODONE HCL 10 MG PO TABS: 10.0000 mg | ORAL_TABLET | Freq: Four times a day (QID) | ORAL | 0 refills | Status: DC | PRN

## 2017-06-23 MED ORDER — SENNOSIDES-DOCUSATE SODIUM 8.6-50 MG PO TABS: 1.0000 | ORAL_TABLET | Freq: Two times a day (BID) | ORAL | 2 refills | Status: DC

## 2017-06-23 MED ORDER — ACETAMINOPHEN 325 MG PO TABS: 650.0000 mg | ORAL_TABLET | Freq: Four times a day (QID) | ORAL | 0 refills | Status: DC | PRN

## 2017-06-23 NOTE — Discharge Summary (Signed)
Lisa [name] NAME: Lisa [name]    MR#:  [id number]  DATE OF BIRTH:  1929/01/26  DATE OF ADMISSION:  06/19/2017   ADMITTING PHYSICIAN: Gladstone Lighter, MD  DATE OF DISCHARGE: 06/23/17  PRIMARY CARE PHYSICIAN: Marinda Elk, MD   ADMISSION DIAGNOSIS:   Closed fracture of right hip, initial encounter ([place]) [S72.001A]  DISCHARGE DIAGNOSIS:   Active Problems:   Closed right hip fracture (HCC)   Malnutrition of moderate degree   SECONDARY DIAGNOSIS:   Past Medical History:  Diagnosis Date  . A-fib ([place])   . Basal cell carcinoma   . Constipation   . Cystocele   . Hyperlipemia   . Hypertension   . Incomplete bladder emptying   . Menopausal state   . Urethral caruncle   . Urinary urgency     HOSPITAL COURSE:   ShirleyHuffmanis a88 y.o.femalewith a known history of paroxysmal atrial fibrillation on eliquis, hypertension, hyperlipidemia who lives independently at home was brought in secondary to a fall and right femoral intertrochanteric fracture.  1. Right Hip intertrochanteric fracture- secondary to mechanical fall. - s/p POD # 2, intertrochanteric intramedullary nail placement -Appreciate orthopedic consult. Pain control - working well with physical therapy  - likely discharge to rehab  2.Acute post op anemia-no indication for transfusion yet Monitor hb, at 7.8 Started iron supplements - also bleeding telangiectasia noted outside labia- if further active bleeding- will transfuse 1 unit If not outpatient f/u with dermatology  3. Paroxysmal atrial fibrillation- currently in sinus rhythm On low dose metoprolol - eliquis restarted  3. Hypertension- on metoprolol and losartan  4. DVT prophylaxis- already on eliquis    Physical Therapy consulted- needs rehab at discharge    DISCHARGE CONDITIONS:   Guarded  CONSULTS OBTAINED:   Treatment Team:  Dereck Leep, MD  DRUG  ALLERGIES:   Allergies  Allergen Reactions  . Ace Inhibitors Cough  . Benadryl [Diphenhydramine Hcl (Sleep)]   . Diphenhydramine-Apap (Sleep) Other (See Comments)    Reaction: Couldn't use kidneys  . Tylenol Pm Extra [Diphenhydramine-Apap (Sleep)]     Reaction: Couldn't use kidneys   DISCHARGE MEDICATIONS:   Allergies as of 06/23/2017      Reactions   Ace Inhibitors Cough   Benadryl [diphenhydramine Hcl (sleep)]    Diphenhydramine-apap (sleep) Other (See Comments)   Reaction: Couldn't use kidneys   Tylenol Pm Extra [diphenhydramine-apap (sleep)]    Reaction: Couldn't use kidneys      Medication List    STOP taking these medications   amLODipine 10 MG tablet Commonly known as:  NORVASC   cephALEXin 500 MG capsule Commonly known as:  KEFLEX   docusate sodium 100 MG capsule Commonly known as:  COLACE   losartan 25 MG tablet Commonly known as:  COZAAR   predniSONE 10 MG (21) Tbpk tablet Commonly known as:  STERAPRED UNI-PAK 21 TAB   traMADol 50 MG tablet Commonly known as:  ULTRAM   Turmeric 500 MG Tabs     TAKE these medications   acetaminophen 325 MG tablet Commonly known as:  TYLENOL Take 2 tablets (650 mg total) by mouth every 6 (six) hours as needed for mild pain (pain score 1-3 or temp > 100.5). What changed:    reasons to take this  Another medication with the same name was removed. Continue taking this medication, and follow the directions you see here.   ELIQUIS 2.5 MG Tabs tablet Generic drug:  apixaban Take  2.5 mg by mouth 2 (two) times daily.   feeding supplement (ENSURE ENLIVE) Liqd Take 237 mLs by mouth 2 (two) times daily between meals.   ferrous sulfate 325 (65 FE) MG tablet Take 1 tablet (325 mg total) by mouth 2 (two) times daily with a meal.   metoprolol tartrate 25 MG tablet Commonly known as:  LOPRESSOR Take 25 mg by mouth 2 (two) times daily.   multivitamin with minerals Tabs tablet Take 1 tablet by mouth daily. Start taking  on:  06/24/2017   Oxycodone HCl 10 MG Tabs Take 1 tablet (10 mg total) by mouth every 6 (six) hours as needed for severe pain (pain score 7-10).   oxyCODONE 5 MG immediate release tablet Commonly known as:  Oxy IR/ROXICODONE Take 1 tablet (5 mg total) by mouth every 6 (six) hours as needed for moderate pain (pain score 4-6).   polyethylene glycol packet Commonly known as:  MIRALAX / GLYCOLAX Take 17 g by mouth daily as needed for mild constipation.   rosuvastatin 5 MG tablet Commonly known as:  CRESTOR Take 5 mg by mouth daily at 6 PM.   senna-docusate 8.6-50 MG tablet Commonly known as:  Senokot-S Take 1 tablet by mouth 2 (two) times daily.   sodium chloride 0.65 % Soln nasal spray Commonly known as:  OCEAN Place 1 spray into both nostrils at bedtime.   vitamin B-12 1000 MCG tablet Commonly known as:  CYANOCOBALAMIN Take 1,000 mcg by mouth daily.   Vitamin D3 2000 units Tabs Take 2,000 Units by mouth daily.        DISCHARGE INSTRUCTIONS:   1. PCP f/u in 1 week 2. CBC check in 3 days 3. Orthopedics f/u as scheduled  DIET:   Cardiac diet  ACTIVITY:   Activity as tolerated  OXYGEN:   Home Oxygen: No.  Oxygen Delivery: room air  DISCHARGE LOCATION:   nursing home   If you experience worsening of your admission symptoms, develop shortness of breath, life threatening emergency, suicidal or homicidal thoughts you must seek medical attention immediately by calling 911 or calling your MD immediately  if symptoms less severe.  You Must read complete instructions/literature along with all the possible adverse reactions/side effects for all the Medicines you take and that have been prescribed to you. Take any new Medicines after you have completely understood and accpet all the possible adverse reactions/side effects.   Please note  You were cared for by a hospitalist during your hospital stay. If you have any questions about your discharge medications or the care  you received while you were in the hospital after you are discharged, you can call the unit and asked to speak with the hospitalist on call if the hospitalist that took care of you is not available. Once you are discharged, your primary care physician will handle any further medical issues. Please note that NO REFILLS for any discharge medications will be authorized once you are discharged, as it is imperative that you return to your primary care physician (or establish a relationship with a primary care physician if you do not have one) for your aftercare needs so that they can reassess your need for medications and monitor your lab values.    On the day of Discharge:  VITAL SIGNS:   Blood pressure (!) 141/57, pulse 85, temperature 97.9 F (36.6 C), temperature source Oral, resp. rate 18, height 5' 2.5" (1.588 m), weight 56.2 kg (124 lb), SpO2 95 %.  PHYSICAL EXAMINATION:  GENERAL:  84 y.o.-year-old patient lying in the bed with no acute distress.  EYES: Pupils equal, round, reactive to light and accommodation. No scleral icterus. Extraocular muscles intact.  HEENT: Head atraumatic, normocephalic. Oropharynx and nasopharynx clear.  NECK:  Supple, no jugular venous distention. No thyroid enlargement, no tenderness.  LUNGS: Normal breath sounds bilaterally, no wheezing, rales,rhonchi or crepitation. No use of accessory muscles of respiration.  CARDIOVASCULAR: S1, S2 normal. No  rubs, or gallops. 2/6 systolic murmur present ABDOMEN: Soft, nontender, nondistended. Bowel sounds present. No organomegaly or mass. GENITOURINARY- outside the right labia,  There are 2 telangiectasias- with minimal bleeding today EXTREMITIES: No pedal edema, cyanosis, or clubbing. Right hip pain and dressing in place NEUROLOGIC: Cranial nerves II through XII are intact. Muscle strength 5/5 in all extremities. Sensation intact. Gait not checked.  PSYCHIATRIC: The patient is alert and oriented x 3.  SKIN: No obvious  rash, lesion, or ulcer.     DATA REVIEW:   CBC Recent Labs  Lab 06/23/17 0520  WBC 9.0  HGB 7.8*  HCT 24.1*  PLT 124*    Chemistries  Recent Labs  Lab 06/22/17 0504  NA 136  K 3.9  CL 105  CO2 25  GLUCOSE 116*  BUN 22*  CREATININE 0.92  CALCIUM 7.9*     Microbiology Results  Results for orders placed or performed during the hospital encounter of 06/19/17  Surgical PCR screen     Status: None   Collection Time: 06/19/17  6:06 PM  Result Value Ref Range Status   MRSA, PCR NEGATIVE NEGATIVE Final   Staphylococcus aureus NEGATIVE NEGATIVE Final    Comment: (NOTE) The Xpert SA Assay (FDA approved for NASAL specimens in patients 84 years of age and older), is one component of a comprehensive surveillance program. It is not intended to diagnose infection nor to guide or monitor treatment. Performed at [facility], [address] [place], [place] [address]     RADIOLOGY:  No results found.   Management plans discussed with the patient, family and they are in agreement.  CODE STATUS:     Code Status Orders  (From admission, onward)        Start     Ordered   06/19/17 1726  Full code  Continuous     06/19/17 1725    Code Status History    Date Active Date Inactive Code Status Order ID Comments User Context   11/06/2016 0357 11/09/2016 1401 Full Code [id number]  Harrie Foreman, MD Inpatient    Advance Directive Documentation     Most Recent Value  Type of Advance Directive  Healthcare Power of [place], Living will  Pre-existing out of facility DNR order (yellow form or pink MOST form)  -  "MOST" Form in Place?  -      TOTAL TIME TAKING CARE OF THIS PATIENT: 38 minutes.    Gladstone Lighter M.D on 06/23/2017 at 9:04 AM  Between 7am to 6pm - Pager - [phone]  After 6pm go to [email or web address] - Proofreader  [facility]  Office  [phone]  CC: Primary care physician; Marinda Elk,  MD   Note: This dictation was prepared with Dragon dictation along with smaller phrase technology. Any transcriptional errors that result from this process are unintentional.

## 2017-06-23 NOTE — Progress Notes (Signed)
Physical Therapy Treatment Patient Details Name: Lisa [name] MRN: [id number] DOB: 1928-10-24 Today's Date: 06/23/2017    History of Present Illness 84 y/o female s/p fall with R hip fx, s/p ORIF nailing 3/27.    PT Comments    Pt struggled with PT session today.  She did relatively well with supine exercises and showed decent strength and quality of motion but mobility was limited.  She struggled to keep weight forward on the walker after considerable assist just to get to upright and was only able to take a few side shuffles steps along EOB with constant posterior lean and generally poor ability to coordinate weight shifts (especially WBing on R) and fatigued with with minimal effort as well.   Follow Up Recommendations  SNF     Equipment Recommendations       Recommendations for Other Services       Precautions / Restrictions Precautions Precautions: Fall Restrictions RLE Weight Bearing: Weight bearing as tolerated    Mobility  Bed Mobility Overal bed mobility: Needs Assistance Bed Mobility: Sit to Supine;Supine to Sit     Supine to sit: Min assist Sit to supine: Mod assist   General bed mobility comments: Pt showed good effort but was unble to get in/out of bed w/o assist with trunk and R LE  Transfers Overall transfer level: Needs assistance Equipment used: Rolling walker (2 wheeled) Transfers: Sit to/from Stand Sit to Stand: Min assist;Mod assist         General transfer comment: Pt struggled with getting to standing today, increased need for assist as per previous session  Ambulation/Gait Ambulation/Gait assistance: Mod assist   Assistive device: Rolling walker (2 wheeled)       General Gait Details: Pt much less confident with ambulation attempt today, actually only able to take a few side steps along EOB as she was leaning backward and needing assist just to keep from falling back into the bed.  Poor tolerance and execution today with weight  shifting and attempted ambulation.    Stairs            Wheelchair Mobility    Modified Rankin (Stroke Patients Only)       Balance Overall balance assessment: Needs assistance Sitting-balance support: Bilateral upper extremity supported Sitting balance-Leahy Scale: Good   Postural control: Posterior lean Standing balance support: Bilateral upper extremity supported Standing balance-Leahy Scale: Poor Standing balance comment: Pt leaning back, unable to utilize walker appropriately, feeling somewhat dizzy                            Cognition Arousal/Alertness: Awake/alert Behavior During Therapy: WFL for tasks assessed/performed Overall Cognitive Status: Within Functional Limits for tasks assessed                                        Exercises General Exercises - Lower Extremity Ankle Circles/Pumps: AROM;10 reps Quad Sets: Strengthening;10 reps Gluteal Sets: Strengthening;10 reps Short Arc Quad: AROM;15 reps Heel Slides: AROM;10 reps Hip ABduction/ADduction: Strengthening;10 reps    General Comments        Pertinent Vitals/Pain Pain Assessment: 0-10 Pain Score: (minimal pain at rest, 6/10 with activity)    Home Living                      Prior Function  PT Goals (current goals can now be found in the care plan section)      Frequency    BID      PT Plan Current plan remains appropriate    Co-evaluation              AM-PAC PT "6 Clicks" Daily Activity  Outcome Measure  Difficulty turning over in bed (including adjusting bedclothes, sheets and blankets)?: A Lot Difficulty moving from lying on back to sitting on the side of the bed? : Unable Difficulty sitting down on and standing up from a chair with arms (e.g., wheelchair, bedside commode, etc,.)?: Unable Help needed moving to and from a bed to chair (including a wheelchair)?: A Lot Help needed walking in hospital room?: Total Help  needed climbing 3-5 steps with a railing? : Total 6 Click Score: 8    End of Session Equipment Utilized During Treatment: Gait belt Activity Tolerance: Patient limited by fatigue;Patient tolerated treatment well Patient left: with call bell/phone within reach;with bed alarm set Nurse Communication: Mobility status PT Visit Diagnosis: Muscle weakness (generalized) (M62.81);Difficulty in walking, not elsewhere classified (R26.2)     Time: 1962-2297 PT Time Calculation (min) (ACUTE ONLY): 26 min  Charges:  $Therapeutic Exercise: 8-22 mins $Therapeutic Activity: 8-22 mins                    G Codes:       Kreg Shropshire, DPT 06/23/2017, 2:30 PM

## 2017-06-23 NOTE — Progress Notes (Addendum)
Patient is medically stable for D/C to [facility] today. Per [facility] admissions coordinator at [facility] authorization has been received and patient can come today to room 208-B. RN will call report at [phone] and arrange EMS for transport. Clinical Education officer, museum (CSW) sent D/C orders to [employer] via [employer]. Patient understands that it is a semi-private room. Patient is aware of above. CSW attempted to contact patient's daughters [name] and [name] however they did not answer and voicemails were left. CSW also attempted to contact patient's son Cam however he did not answer and a voicemail was left. Please reconsult if future social work needs arise. CSW signing off.   Patient's son Cam called CSW back and is in agreement with plan.   Patient's daughter [name] called CSW back and is in agreement with plan.   [employer], LCSW [phone]

## 2017-06-23 NOTE — Clinical Social Work Placement (Addendum)
   CLINICAL SOCIAL WORK PLACEMENT  NOTE  Date:  06/23/2017  Patient Details  Name: Lisa [name] MRN: [id number] Date of Birth: 23-Jul-1928  Clinical Social Work is seeking post-discharge placement for this patient at the [place] level of care (*CSW will initial, date and re-position this form in  chart as items are completed):  Yes   Patient/family provided with [place] Work Department's list of facilities offering this level of care within the geographic area requested by the patient (or if unable, by the patient's family).  Yes   Patient/family informed of their freedom to choose among providers that offer the needed level of care, that participate in Medicare, Medicaid or managed care program needed by the patient, have an available bed and are willing to accept the patient.  Yes   Patient/family informed of 's ownership interest in [facility] and [facility], as well as of the fact that they are under no obligation to receive care at these facilities.  PASRR submitted to EDS on       PASRR number received on       Existing PASRR number confirmed on 06/20/17     FL2 transmitted to all facilities in geographic area requested by pt/family on 06/20/17     FL2 transmitted to all facilities within larger geographic area on       Patient informed that his/her managed care company has contracts with or will negotiate with certain facilities, including the following:        Yes   Patient/family informed of bed offers received.  Patient chooses bed at [facility])     Physician recommends and patient chooses bed at      Patient to be transferred to [facility] ) on 06/23/17.  Patient to be transferred to facility by [facility] )     Patient family notified on 06/23/17 of transfer.  Name of family member notified:  (CSW left voicemails for patient's daughters [name] and [name]. CSW also left a voicemail  for patient's son Cam. ) Patient's son Cam called CSW back and is in agreement with plan. Patient's daughter [name] called CSW back and is in agreement with plan.   PHYSICIAN       Additional Comment:    _______________________________________________ Zarrah [place]veland, Veronia Beets, LCSW 06/23/2017, 12:14 PM

## 2017-06-23 NOTE — Progress Notes (Signed)
   Subjective: 2 Days Post-Op Procedure(s) (LRB): INTRAMEDULLARY (IM) NAIL INTERTROCHANTRIC (Right) Patient reports pain as mild.   Patient is well, and has had no acute complaints or problems Patient did well with physical therapy yesterday.  No complaints Plan is to go Rehab after hospital stay. no nausea and no vomiting Patient denies any chest pains or shortness of breath.  Objective: Vital signs in last 24 hours: Temp:  [97.5 F (36.4 C)-98.4 F (36.9 C)] 98.4 F (36.9 C) (03/28 2339) Pulse Rate:  [68-72] 72 (03/28 2339) Resp:  [15-18] 15 (03/28 2339) BP: (108-111)/(46-55) 111/48 (03/28 2339) SpO2:  [92 %-98 %] 94 % (03/28 2339) well approximated incision Heels are non tender and elevated off the bed using rolled towels Intake/Output from previous day: 03/28 0701 - 03/29 0700 In: 2975 [P.O.:960; I.V.:1715; IV Piggyback:300] Out: 600 [Urine:600] Intake/Output this shift: No intake/output data recorded.  Recent Labs    06/22/17 0504 06/23/17 0520  HGB 8.5* 7.8*   Recent Labs    06/22/17 0504 06/23/17 0520  WBC 8.4 9.0  RBC 2.91* 2.64*  HCT 26.6* 24.1*  PLT 117* 124*   Recent Labs    06/22/17 0504  NA 136  K 3.9  CL 105  CO2 25  BUN 22*  CREATININE 0.92  GLUCOSE 116*  CALCIUM 7.9*   No results for input(s): LABPT, INR in the last 72 hours.  EXAM General - Patient is Alert, Appropriate and Oriented Extremity - Neurologically intact Neurovascular intact Sensation intact distally Intact pulses distally Dorsiflexion/Plantar flexion intact No cellulitis present Compartment soft Dressing - scant drainage Motor Function - intact, moving foot and toes well on exam.    Past Medical History:  Diagnosis Date  . A-fib ([place])   . Basal cell carcinoma   . Constipation   . Cystocele   . Hyperlipemia   . Hypertension   . Incomplete bladder emptying   . Menopausal state   . Urethral caruncle   . Urinary urgency     Assessment/Plan: 2 Days Post-Op  Procedure(s) (LRB): INTRAMEDULLARY (IM) NAIL INTERTROCHANTRIC (Right) Active Problems:   Closed right hip fracture (HCC)   Malnutrition of moderate degree  Estimated body mass index is 22.32 kg/m as calculated from the following:   Height as of this encounter: 5' 2.5" (1.588 m).   Weight as of this encounter: 56.2 kg (124 lb). Up with therapy Discharge to SNF when medically cleared  Labs: Hemoglobin 7.5.  However patient appears to be asymptomatic DVT Prophylaxis - Lovenox, Foot Pumps and TED hose Foot flat weightbearing to right leg Begin working on bowel movement We will continue Lovenox 2 weeks postop TED stockings will need to be continued for 6 weeks. We will need to follow-up in [place] in 6 weeks.  Sooner if any problems Stable will need to be removed 2 weeks postop followed by the application of benzoin and Steri-Strips    Jon R. [place] 06/23/2017, 7:42 AM

## 2017-06-26 ENCOUNTER — Other Ambulatory Visit
Admission: RE | Admit: 2017-06-26 | Discharge: 2017-06-26 | Disposition: A | Discharge status: Home / Self Care | Payer: Medicare Other | Source: Skilled Nursing Facility | Attending: Internal Medicine | Admitting: Internal Medicine

## 2017-06-26 DIAGNOSIS — D649 Anemia, unspecified: Secondary | ICD-10-CM | POA: Insufficient documentation

## 2017-06-26 LAB — CBC WITH DIFFERENTIAL/PLATELET
Basophils Absolute: 0.1 10*3/uL (ref 0–0.1)
Basophils Relative: 1 %
Eosinophils Absolute: 0.3 10*3/uL (ref 0–0.7)
Eosinophils Relative: 3 %
HCT: 22.3 % — ABNORMAL LOW (ref 35.0–47.0)
Hemoglobin: 7.3 g/dL — ABNORMAL LOW (ref 12.0–16.0)
Lymphocytes Relative: 15 %
Lymphs Abs: 1.4 10*3/uL (ref 1.0–3.6)
MCH: 30.4 pg (ref 26.0–34.0)
MCHC: 32.8 g/dL (ref 32.0–36.0)
MCV: 92.7 fL (ref 80.0–100.0)
Monocytes Absolute: 1 10*3/uL — ABNORMAL HIGH (ref 0.2–0.9)
Monocytes Relative: 11 %
Neutro Abs: 6.4 10*3/uL (ref 1.4–6.5)
Neutrophils Relative %: 70 %
Platelets: 223 10*3/uL (ref 150–440)
RBC: 2.41 MIL/uL — ABNORMAL LOW (ref 3.80–5.20)
RDW: 17.1 % — ABNORMAL HIGH (ref 11.5–14.5)
WBC: 9.2 10*3/uL (ref 3.6–11.0)

## 2017-06-27 ENCOUNTER — Other Ambulatory Visit
Admission: RE | Admit: 2017-06-27 | Discharge: 2017-06-27 | Disposition: A | Discharge status: Home / Self Care | Payer: Medicare Other | Source: Ambulatory Visit | Attending: Gerontology | Admitting: Gerontology

## 2017-06-27 DIAGNOSIS — D649 Anemia, unspecified: Secondary | ICD-10-CM | POA: Diagnosis present

## 2017-06-27 LAB — CBC WITH DIFFERENTIAL/PLATELET
BASOS PCT: 1 %
Basophils Absolute: 0.1 10*3/uL (ref 0–0.1)
Eosinophils Absolute: 0.3 10*3/uL (ref 0–0.7)
Eosinophils Relative: 4 %
HEMATOCRIT: 24 % — AB (ref 35.0–47.0)
HEMOGLOBIN: 7.6 g/dL — AB (ref 12.0–16.0)
LYMPHS PCT: 16 %
Lymphs Abs: 1.2 10*3/uL (ref 1.0–3.6)
MCH: 29.6 pg (ref 26.0–34.0)
MCHC: 31.8 g/dL — AB (ref 32.0–36.0)
MCV: 93.3 fL (ref 80.0–100.0)
MONOS PCT: 11 %
Monocytes Absolute: 0.8 10*3/uL (ref 0.2–0.9)
NEUTROS ABS: 5.3 10*3/uL (ref 1.4–6.5)
NEUTROS PCT: 68 %
Platelets: 269 10*3/uL (ref 150–440)
RBC: 2.57 MIL/uL — ABNORMAL LOW (ref 3.80–5.20)
RDW: 17.6 % — ABNORMAL HIGH (ref 11.5–14.5)
WBC: 7.8 10*3/uL (ref 3.6–11.0)

## 2017-06-27 LAB — COMPREHENSIVE METABOLIC PANEL
ALBUMIN: 2.6 g/dL — AB (ref 3.5–5.0)
ALT: 11 U/L — ABNORMAL LOW (ref 14–54)
ANION GAP: 6 (ref 5–15)
AST: 22 U/L (ref 15–41)
Alkaline Phosphatase: 65 U/L (ref 38–126)
BILIRUBIN TOTAL: 1.3 mg/dL — AB (ref 0.3–1.2)
BUN: 19 mg/dL (ref 6–20)
CO2: 28 mmol/L (ref 22–32)
Calcium: 8.6 mg/dL — ABNORMAL LOW (ref 8.9–10.3)
Chloride: 109 mmol/L (ref 101–111)
Creatinine, Ser: 0.58 mg/dL (ref 0.44–1.00)
GFR calc Af Amer: >60 mL/min (ref >60)
GFR calc non Af Amer: >60 mL/min (ref >60)
GLUCOSE: 104 mg/dL — AB (ref 65–99)
Potassium: 3.7 mmol/L (ref 3.5–5.1)
Sodium: 143 mmol/L (ref 135–145)
TOTAL PROTEIN: 5.2 g/dL — AB (ref 6.5–8.1)

## 2017-07-03 ENCOUNTER — Other Ambulatory Visit
Admission: RE | Admit: 2017-07-03 | Discharge: 2017-07-03 | Disposition: A | Discharge status: Home / Self Care | Payer: Medicare Other | Source: Other Acute Inpatient Hospital | Attending: Gerontology | Admitting: Gerontology

## 2017-07-03 DIAGNOSIS — D649 Anemia, unspecified: Secondary | ICD-10-CM | POA: Insufficient documentation

## 2017-07-03 LAB — CBC WITH DIFFERENTIAL/PLATELET
BASOS ABS: 0 10*3/uL (ref 0–0.1)
BASOS PCT: 0 %
Eosinophils Absolute: 0.1 10*3/uL (ref 0–0.7)
Eosinophils Relative: 1 %
HCT: 31.6 % — ABNORMAL LOW (ref 35.0–47.0)
Hemoglobin: 9.9 g/dL — ABNORMAL LOW (ref 12.0–16.0)
Lymphocytes Relative: 12 %
Lymphs Abs: 1.2 10*3/uL (ref 1.0–3.6)
MCH: 30.5 pg (ref 26.0–34.0)
MCHC: 31.4 g/dL — ABNORMAL LOW (ref 32.0–36.0)
MCV: 97.1 fL (ref 80.0–100.0)
MONO ABS: 0.6 10*3/uL (ref 0.2–0.9)
MONOS PCT: 6 %
NEUTROS PCT: 81 %
Neutro Abs: 8.3 10*3/uL — ABNORMAL HIGH (ref 1.4–6.5)
Platelets: 318 10*3/uL (ref 150–440)
RBC: 3.25 MIL/uL — ABNORMAL LOW (ref 3.80–5.20)
RDW: 22.6 % — ABNORMAL HIGH (ref 11.5–14.5)
WBC: 10.3 10*3/uL (ref 3.6–11.0)

## 2017-07-03 LAB — BASIC METABOLIC PANEL
Anion gap: 5 (ref 5–15)
BUN: 26 mg/dL — AB (ref 6–20)
CHLORIDE: 105 mmol/L (ref 101–111)
CO2: 29 mmol/L (ref 22–32)
Calcium: 9.1 mg/dL (ref 8.9–10.3)
Creatinine, Ser: 0.67 mg/dL (ref 0.44–1.00)
GFR calc Af Amer: >60 mL/min (ref >60)
GFR calc non Af Amer: >60 mL/min (ref >60)
GLUCOSE: 101 mg/dL — AB (ref 65–99)
Potassium: 3.6 mmol/L (ref 3.5–5.1)
Sodium: 139 mmol/L (ref 135–145)

## 2017-07-04 ENCOUNTER — Encounter: Payer: Self-pay | Admitting: Obstetrics and Gynecology

## 2017-07-05 ENCOUNTER — Encounter: Payer: Self-pay | Admitting: Gerontology

## 2017-07-05 ENCOUNTER — Non-Acute Institutional Stay (SKILLED_NURSING_FACILITY): Payer: Medicare Other | Admitting: Gerontology

## 2017-07-05 DIAGNOSIS — S72001D Fracture of unspecified part of neck of right femur, subsequent encounter for closed fracture with routine healing: Secondary | ICD-10-CM | POA: Diagnosis not present

## 2017-07-05 DIAGNOSIS — E440 Moderate protein-calorie malnutrition: Secondary | ICD-10-CM

## 2017-07-05 DIAGNOSIS — Z9889 Other specified postprocedural states: Secondary | ICD-10-CM | POA: Diagnosis not present

## 2017-07-05 NOTE — Progress Notes (Signed)
Location:   [facility] Room Number: 208B Place of Service:  SNF [phone]) Provider:  Toni Arthurs, NP-C  Marinda Elk, MD  Patient Care Team: Marinda Elk, MD as PCP - General (Physician Assistant)  Extended Emergency Contact Information Primary Emergency Contact: [place] Phone: [phone] Relation: Daughter Secondary Emergency Contact: Kendra Opitz [place] of [place] Phone: [phone] Relation: Daughter  Code Status:  FULL Goals of care: Advanced Directive information Advanced Directives 06/19/2017  Does Patient Have a Medical Advance Directive? Yes  Type of Paramedic of [place];Living will  Does patient want to make changes to medical advance directive? No - Patient declined  Copy of [place] in Chart? No - copy requested  Would patient like information on creating a medical advance directive? -     Chief Complaint  Patient presents with  . Medical Management of Chronic Issues    Routine Visit    HPI:  Pt is a 84 y.o. female seen today for medical management of chronic diseases.  Pt was admitted to the facility for rehab following hospitalization at [facility] for Right Hip fracture with IM nailing. Pt has been participating in PT/OT. She reports pain is only with weight bearing. Incision well approximated, CDI. No redness, no drainage. Mild generalized edema to the RLE. Calves soft, supple. Negative homan's sign. Pt reports her pain is well controlled with current regimen. Appetite good, voiding well. Denies constipation. VSS. No other complaints.       Past Medical History:  Diagnosis Date  . A-fib ([place])   . Allergic rhinitis    with chronic nasal congestion  . Anemia, unspecified   . Basal cell carcinoma   . Change in bowel habit 09/30/2016  . Chicken pox   . Constipation   . Cystocele   . Enthesopathy of hip region   .  Fatigue 09/30/2016  . Fibrocystic breast disease   . Hyperlipemia   . Hypertension   . Incomplete bladder emptying   . Irregular heart beat    and shortness of breath with SVT, PAT, and ventricular ectopy   . Menopausal state   . PAC (premature atrial contraction)   . Palpitations   . Peripheral vascular disease ([place])   . PVC (premature ventricular contraction)   . Sinoatrial node dysfunction ([facility])   . Urethral caruncle   . Urinary urgency   . Varicella zoster 04/2007  . Weight loss, unintentional 09/30/2016   Past Surgical History:  Procedure Laterality Date  . ABLATION    . APPENDECTOMY    . BASAL CELL CARCINOMA EXCISION    . BREAST CYST ASPIRATION Left   . CARDIAC SURGERY    . CARDIOVERSION    . CATARACT EXTRACTION Bilateral 2015  . CHOLECYSTECTOMY    . ELECTROPHYSIOLOGIC STUDY N/A 09/02/2015   Procedure: Cardioversion;  Surgeon: Corey Skains, MD;  Location: [facility] ORS;  Service: Cardiovascular;  Laterality: N/A;  . ELECTROPHYSIOLOGIC STUDY N/A 10/08/2015   Procedure: CARDIOVERSION;  Surgeon: Corey Skains, MD;  Location: [facility] ORS;  Service: Cardiovascular;  Laterality: N/A;  . INTRAMEDULLARY (IM) NAIL INTERTROCHANTERIC Right 06/21/2017   Procedure: INTRAMEDULLARY (IM) NAIL INTERTROCHANTRIC;  Surgeon: Dereck Leep, MD;  Location: [facility] ORS;  Service: Orthopedics;  Laterality: Right;  . KYPHOPLASTY N/A 11/08/2016   Procedure: [id number];  Surgeon: Hessie Knows, MD;  Location: [facility] ORS;  Service: Orthopedics;  Laterality: N/A;  . TOTAL ABDOMINAL HYSTERECTOMY W/  BILATERAL SALPINGOOPHORECTOMY    . WISDOM TOOTH EXTRACTION    . WRIST FRACTURE SURGERY Right     Allergies  Allergen Reactions  . Ace Inhibitors Cough  . Benadryl [Diphenhydramine Hcl (Sleep)]   . Diphenhydramine-Apap (Sleep) Other (See Comments)    Reaction: Couldn't use kidneys  . Tylenol Pm Extra [Diphenhydramine-Apap (Sleep)]     Reaction: Couldn't use kidneys    Allergies as of 07/05/2017       Reactions   Ace Inhibitors Cough   Benadryl [diphenhydramine Hcl (sleep)]    Diphenhydramine-apap (sleep) Other (See Comments)   Reaction: Couldn't use kidneys   Tylenol Pm Extra [diphenhydramine-apap (sleep)]    Reaction: Couldn't use kidneys      Medication List        Accurate as of 07/05/17 12:53 PM. Always use your most recent med list.          acetaminophen 325 MG tablet Commonly known as:  TYLENOL Take 2 tablets (650 mg total) by mouth every 6 (six) hours as needed for mild pain (pain score 1-3 or temp > 100.5).   cholecalciferol 1000 units tablet Commonly known as:  VITAMIN D Take 1,000 Units by mouth daily.   ELIQUIS 2.5 MG Tabs tablet Generic drug:  apixaban Take 2.5 mg by mouth 2 (two) times daily.   feeding supplement (PRO-STAT SUGAR FREE 64) Liqd Take 30 mLs by mouth 2 (two) times daily between meals.   ferrous sulfate 325 (65 FE) MG tablet Take 1 tablet (325 mg total) by mouth 2 (two) times daily with a meal.   metoprolol tartrate 25 MG tablet Commonly known as:  LOPRESSOR Take 25 mg by mouth 2 (two) times daily.   multivitamin with minerals Tabs tablet Take 1 tablet by mouth daily.   oxyCODONE 5 MG immediate release tablet Commonly known as:  Oxy IR/ROXICODONE Take 5-10 mg by mouth every 6 (six) hours as needed. take 1 tablet for pain level 4-6, and 2 tablets for pain level 7-10   polyethylene glycol packet Commonly known as:  MIRALAX / GLYCOLAX Take 17 g by mouth daily.   rosuvastatin 5 MG tablet Commonly known as:  CRESTOR Take 5 mg by mouth 3 (three) times a week. On Monday, Wednesday and Friday   sodium chloride 0.65 % Soln nasal spray Commonly known as:  OCEAN Place 1 spray into both nostrils at bedtime.   vitamin B-12 1000 MCG tablet Commonly known as:  CYANOCOBALAMIN Take 1,000 mcg by mouth daily.       Review of Systems  Constitutional: Negative for activity change, appetite change, chills, diaphoresis and fever.  HENT:  Negative for congestion, mouth sores, nosebleeds, postnasal drip, sneezing, sore throat, trouble swallowing and voice change.   Respiratory: Negative for apnea, cough, choking, chest tightness, shortness of breath and wheezing.   Cardiovascular: Negative for chest pain, palpitations and leg swelling.  Gastrointestinal: Negative for abdominal distention, abdominal pain, constipation, diarrhea and nausea.  Genitourinary: Negative for difficulty urinating, dysuria, frequency and urgency.  Musculoskeletal: Positive for arthralgias (typical arthritis) and gait problem. Negative for back pain and myalgias.  Skin: Positive for wound. Negative for color change, pallor and rash.  Neurological: Negative for dizziness, tremors, syncope, speech difficulty, weakness, numbness and headaches.  Psychiatric/Behavioral: Negative for agitation and behavioral problems.  All other systems reviewed and are negative.   Immunization History  Administered Date(s) Administered  . Influenza, High Dose Seasonal PF 02/19/2017  . Influenza-Unspecified 01/28/2014  . Pneumococcal Conjugate-13 10/12/2016  . Tdap  07/16/2015  . Zoster 05/28/2013   Pertinent  Health Maintenance Due  Topic Date Due  . DEXA SCAN  11/14/1995  . PNA vac Low Risk Adult (1 of 2 - PCV13) 11/14/1995  . INFLUENZA VACCINE  10/26/2017   No flowsheet data found. Functional Status Survey:    Vitals:   07/05/17 1146  BP: (!) 143/58  Pulse: 67  Resp: 20  Temp: 99.3 F (37.4 C)  TempSrc: Oral  SpO2: 98%  Weight: 127 lb 9.6 oz (57.9 kg)  Height: 5' 2.5" (1.588 m)   Body mass index is 22.97 kg/m. Physical Exam  Constitutional: She is oriented to person, place, and time. Vital signs are normal. She appears well-developed and well-nourished. She is active and cooperative. She does not appear ill. No distress.  HENT:  Head: Normocephalic and atraumatic.  Mouth/Throat: Uvula is midline, oropharynx is clear and moist and mucous membranes are  normal. Mucous membranes are not pale, not dry and not cyanotic.  Eyes: Pupils are equal, round, and reactive to light. Conjunctivae, EOM and lids are normal.  Neck: Trachea normal, normal range of motion and full passive range of motion without pain. Neck supple. No JVD present. No tracheal deviation, no edema and no erythema present. No thyromegaly present.  Cardiovascular: Normal rate, regular rhythm, normal heart sounds, intact distal pulses and normal pulses. Exam reveals no gallop, no distant heart sounds and no friction rub.  No murmur heard. Pulses:      Dorsalis pedis pulses are 2+ on the right side, and 2+ on the left side.  No edema  Pulmonary/Chest: Effort normal and breath sounds normal. No accessory muscle usage. No respiratory distress. She has no decreased breath sounds. She has no wheezes. She has no rhonchi. She has no rales. She exhibits no tenderness.  Abdominal: Soft. Normal appearance and bowel sounds are normal. She exhibits no distension and no ascites. There is no tenderness.  Musculoskeletal: She exhibits no edema.       Right hip: She exhibits decreased range of motion, decreased strength, tenderness and laceration.  Expected osteoarthritis, stiffness; Bilateral Calves soft, supple. Negative Homan's Sign. B- pedal pulses equal  Neurological: She is alert and oriented to person, place, and time. She has normal strength.  Skin: Skin is warm and dry. Laceration noted. She is not diaphoretic. No cyanosis. No pallor. Nails show no clubbing.  Psychiatric: She has a normal mood and affect. Her speech is normal and behavior is normal. Judgment and thought content normal. Cognition and memory are normal.  Nursing note and vitals reviewed.   Labs reviewed: Recent Labs    06/22/17 0504 06/27/17 0645 07/03/17 1740  NA 136 143 139  K 3.9 3.7 3.6  CL 105 109 105  CO2 25 28 29   GLUCOSE 116* 104* 101*  BUN 22* 19 26*  CREATININE 0.92 0.58 0.67  CALCIUM 7.9* 8.6* 9.1    Recent Labs    11/05/16 2121 06/27/17 0645  AST 26 22  ALT 23 11*  ALKPHOS 79 65  BILITOT 0.8 1.3*  PROT 6.4* 5.2*  ALBUMIN 3.6 2.6*   Recent Labs    06/26/17 0700 06/27/17 0645 07/03/17 1740  WBC 9.2 7.8 10.3  NEUTROABS 6.4 5.3 8.3*  HGB 7.3* 7.6* 9.9*  HCT 22.3* 24.0* 31.6*  MCV 92.7 93.3 97.1  PLT 223 269 318   Lab Results  Component Value Date   TSH 2.124 11/05/2016   No results found for: HGBA1C No results found for: CHOL, HDL,  LDLCALC, LDLDIRECT, TRIG, CHOLHDL  Significant Diagnostic Results in last 30 days:  Dg Chest 1 View  Result Date: 06/19/2017 CLINICAL DATA:  Hip pain after fall EXAM: CHEST  1 VIEW COMPARISON:  11/06/2016 FINDINGS: Cardiomegaly. Mild hyperinflation of the lungs. Mild vascular congestion. Small left pleural effusion with left base atelectasis. Calcified granuloma in the left lung base. IMPRESSION: Cardiomegaly with vascular congestion. Hyperinflation. Small left effusion with left base atelectasis. Electronically Signed   By: Rolm Baptise M.D.   On: 06/19/2017 14:34   Ct Head Wo Contrast  Result Date: 06/19/2017 CLINICAL DATA:  Fall EXAM: CT HEAD WITHOUT CONTRAST TECHNIQUE: Contiguous axial images were obtained from the base of the skull through the vertex without intravenous contrast. COMPARISON:  None. FINDINGS: Brain: No acute territorial infarction, hemorrhage or intracranial mass is visualized. Moderate atrophy. Moderate small vessel ischemic changes of the white matter. Prominent ventricle size, felt related to atrophy. Vascular: No hyperdense vessels.  Carotid vascular calcification. Skull: Normal. Negative for fracture or focal lesion. Sinuses/Orbits: No acute finding. Other: None IMPRESSION: 1. No CT evidence for acute intracranial abnormality. 2. Atrophy and moderate small vessel ischemic changes of the white matter Electronically Signed   By: Donavan Foil M.D.   On: 06/19/2017 14:20   Dg C-arm 1-60 Min  Result Date:  06/21/2017 CLINICAL DATA:  Femur fracture EXAM: DG C-ARM 61-120 MIN; RIGHT FEMUR 2 VIEWS COMPARISON:  06/19/2017 FINDINGS: Five low resolution intraoperative spot views of the right femur. Total fluoroscopy time was 1 minutes 48 seconds. Images demonstrate intramedullary rodding and screw fixation of the right femur for intertrochanteric fracture. IMPRESSION: Fluoroscopic assistance provided during surgical fixation of proximal right femur fracture Electronically Signed   By: Donavan Foil M.D.   On: 06/21/2017 21:23   Dg Hip Unilat W Or Wo Pelvis 2-3 Views Right  Result Date: 06/19/2017 CLINICAL DATA:  Right hip pain after fall. EXAM: DG HIP (WITH OR WITHOUT PELVIS) 2-3V RIGHT COMPARISON:  None. FINDINGS: Severely displaced fracture is seen involving the intertrochanteric region of the proximal right femur. Right femoral head is well situated within acetabulum. IMPRESSION: Severely displaced intertrochanteric fracture of proximal right femur. Electronically Signed   By: Marijo Conception, M.D.   On: 06/19/2017 14:38   Dg Femur, Min 2 Views Right  Result Date: 06/21/2017 CLINICAL DATA:  Femur fracture EXAM: DG C-ARM 61-120 MIN; RIGHT FEMUR 2 VIEWS COMPARISON:  06/19/2017 FINDINGS: Five low resolution intraoperative spot views of the right femur. Total fluoroscopy time was 1 minutes 48 seconds. Images demonstrate intramedullary rodding and screw fixation of the right femur for intertrochanteric fracture. IMPRESSION: Fluoroscopic assistance provided during surgical fixation of proximal right femur fracture Electronically Signed   By: Donavan Foil M.D.   On: 06/21/2017 21:23    Assessment/Plan  Closed fracture of right hip with routine healing, subsequent encounter  Status post hip surgery   Continue Pt/OT  Continue exercises as taught by PT/OT  Continue ice to the hip prn for pain, edema  Continue Eliquis 2.5 mg po BID for dvt prophylaxis  Continue Oxycodone 5 mg 1-2 tablets po Q 6 hours prn  pain  Skin care per protocol  Follow up with orthopedist as instructed  Malnutrition of moderate degree   Continue Pro-stat 30 mL po BID  Continue daily MVI  Pt refusing Ensure [employer]  Family/ staff Communication:   Total Time:  Documentation:  Face to Face:  Family/Phone:   Labs/tests ordered:    Medication list reviewed and assessed  for continued appropriateness. Monthly medication orders reviewed and signed.  Vikki Ports, NP-C Geriatrics [facility] [phone], [place] [address] Cell Phone (Mon-Fri 8am-5pm):  [phone] On Call:  [phone] & follow prompts after 5pm & weekends Office Phone:  [phone] Office Fax:  [phone]

## 2017-07-11 ENCOUNTER — Non-Acute Institutional Stay (SKILLED_NURSING_FACILITY): Payer: Medicare Other | Admitting: Gerontology

## 2017-07-11 DIAGNOSIS — E440 Moderate protein-calorie malnutrition: Secondary | ICD-10-CM | POA: Diagnosis not present

## 2017-07-11 DIAGNOSIS — Z9889 Other specified postprocedural states: Secondary | ICD-10-CM

## 2017-07-11 DIAGNOSIS — S72001D Fracture of unspecified part of neck of right femur, subsequent encounter for closed fracture with routine healing: Secondary | ICD-10-CM | POA: Diagnosis not present

## 2017-07-11 NOTE — Progress Notes (Signed)
Location:      Place of Service:  SNF (31)  Provider: Toni Arthurs, NP-C  PCP: Marinda Elk, MD Patient Care Team: Marinda Elk, MD as PCP - General (Physician Assistant)  Extended Emergency Contact Information Primary Emergency Contact: [name], [place] Mobile Phone: [phone] Relation: Son Secondary Emergency Contact: [place] Phone: [phone] Relation: Daughter  Code Status: FULL Goals of care:  Advanced Directive information Advanced Directives 07/05/2017  Does Patient Have a Medical Advance Directive? No  Type of Advance Directive -  Does patient want to make changes to medical advance directive? No - Patient declined  Copy of [place] in Chart? -  Would patient like information on creating a medical advance directive? -     Allergies  Allergen Reactions  . Ace Inhibitors Cough  . Benadryl [Diphenhydramine Hcl (Sleep)]   . Diphenhydramine-Apap (Sleep) Other (See Comments)    Reaction: Couldn't use kidneys  . Tylenol Pm Extra [Diphenhydramine-Apap (Sleep)]     Reaction: Couldn't use kidneys    Chief Complaint  Patient presents with  . Discharge Note    HPI:  84 y.o. female seen today for discharge evaluation. Pt was admitted to the facility for rehab after hospitalization for Right hip fracture with surgical intervention after mechanical fall. Pt has been participating in PT/OT. Pt has been progressing well. Pt reports her pain is well controlled on current regimen. Pt is eating well- reports her appetite has improved. Voiding regularly and having regular BMs. Staples were removed from incision yesterday and steri- strips put in place. Incision well approximated, no redness, no warmth, no drainage. Calves soft, supple. Negative Homan's sign. VSS. No other complaints.      Past Medical History:  Diagnosis Date  . A-fib ([place])   . Allergic rhinitis    with chronic nasal congestion  .  Anemia, unspecified   . Basal cell carcinoma   . Change in bowel habit 09/30/2016  . Chicken pox   . Constipation   . Cystocele   . Enthesopathy of hip region   . Fatigue 09/30/2016  . Fibrocystic breast disease   . Hyperlipemia   . Hypertension   . Incomplete bladder emptying   . Irregular heart beat    and shortness of breath with SVT, PAT, and ventricular ectopy   . Menopausal state   . PAC (premature atrial contraction)   . Palpitations   . Peripheral vascular disease ([place])   . PVC (premature ventricular contraction)   . Sinoatrial node dysfunction ([facility])   . Urethral caruncle   . Urinary urgency   . Varicella zoster 04/2007  . Weight loss, unintentional 09/30/2016    Past Surgical History:  Procedure Laterality Date  . ABLATION    . APPENDECTOMY    . BASAL CELL CARCINOMA EXCISION    . BREAST CYST ASPIRATION Left   . CARDIAC SURGERY    . CARDIOVERSION    . CATARACT EXTRACTION Bilateral 2015  . CHOLECYSTECTOMY    . ELECTROPHYSIOLOGIC STUDY N/A 09/02/2015   Procedure: Cardioversion;  Surgeon: Corey Skains, MD;  Location: [facility] ORS;  Service: Cardiovascular;  Laterality: N/A;  . ELECTROPHYSIOLOGIC STUDY N/A 10/08/2015   Procedure: CARDIOVERSION;  Surgeon: Corey Skains, MD;  Location: [facility] ORS;  Service: Cardiovascular;  Laterality: N/A;  . INTRAMEDULLARY (IM) NAIL INTERTROCHANTERIC Right 06/21/2017   Procedure: INTRAMEDULLARY (IM) NAIL INTERTROCHANTRIC;  Surgeon: Dereck Leep, MD;  Location: [facility] ORS;  Service: Orthopedics;  Laterality: Right;  .  KYPHOPLASTY N/A 11/08/2016   Procedure: [id number];  Surgeon: Hessie Knows, MD;  Location: [facility] ORS;  Service: Orthopedics;  Laterality: N/A;  . TOTAL ABDOMINAL HYSTERECTOMY W/ BILATERAL SALPINGOOPHORECTOMY    . WISDOM TOOTH EXTRACTION    . WRIST FRACTURE SURGERY Right       reports that she has never smoked. She has never used smokeless tobacco. She reports that she does not drink alcohol or use drugs. Social  History   Socioeconomic History  . Marital status: Widowed    Spouse name: Not on file  . Number of children: 3  . Years of education: 16  . Highest education level: Bachelor's degree (e.g., BA, AB, BS)  Occupational History  . Not on file  Social Needs  . Financial resource strain: Not on file  . Food insecurity:    Worry: Not on file    Inability: Not on file  . Transportation needs:    Medical: Not on file    Non-medical: Not on file  Tobacco Use  . Smoking status: Never Smoker  . Smokeless tobacco: Never Used  Substance and Sexual Activity  . Alcohol use: No  . Drug use: No  . Sexual activity: Never  Lifestyle  . Physical activity:    Days per week: Not on file    Minutes per session: Not on file  . Stress: Not on file  Relationships  . Social connections:    Talks on phone: Not on file    Gets together: Not on file    Attends religious service: Not on file    Active member of club or organization: Not on file    Attends meetings of clubs or organizations: Not on file    Relationship status: Not on file  . Intimate partner violence:    Fear of current or ex partner: Not on file    Emotionally abused: Not on file    Physically abused: Not on file    Forced sexual activity: Not on file  Other Topics Concern  . Not on file  Social History Narrative   Lives independently, has a walker to ambulate.   Functional Status Survey:    Allergies  Allergen Reactions  . Ace Inhibitors Cough  . Benadryl [Diphenhydramine Hcl (Sleep)]   . Diphenhydramine-Apap (Sleep) Other (See Comments)    Reaction: Couldn't use kidneys  . Tylenol Pm Extra [Diphenhydramine-Apap (Sleep)]     Reaction: Couldn't use kidneys    Pertinent  Health Maintenance Due  Topic Date Due  . DEXA SCAN  11/14/1995  . PNA vac Low Risk Adult (2 of 2 - PPSV23) 10/12/2017  . INFLUENZA VACCINE  10/26/2017    Medications: Allergies as of 07/11/2017      Reactions   Ace Inhibitors Cough   Benadryl  [diphenhydramine Hcl (sleep)]    Diphenhydramine-apap (sleep) Other (See Comments)   Reaction: Couldn't use kidneys   Tylenol Pm Extra [diphenhydramine-apap (sleep)]    Reaction: Couldn't use kidneys      Medication List        Accurate as of 07/11/17  2:02 PM. Always use your most recent med list.          acetaminophen 325 MG tablet Commonly known as:  TYLENOL Take 2 tablets (650 mg total) by mouth every 6 (six) hours as needed for mild pain (pain score 1-3 or temp > 100.5).   cholecalciferol 1000 units tablet Commonly known as:  VITAMIN D Take 1,000 Units by mouth daily.   ELIQUIS  2.5 MG Tabs tablet Generic drug:  apixaban Take 2.5 mg by mouth 2 (two) times daily.   feeding supplement (PRO-STAT SUGAR FREE 64) Liqd Take 30 mLs by mouth 2 (two) times daily between meals.   ferrous sulfate 325 (65 FE) MG tablet Take 1 tablet (325 mg total) by mouth 2 (two) times daily with a meal.   metoprolol tartrate 25 MG tablet Commonly known as:  LOPRESSOR Take 25 mg by mouth 2 (two) times daily.   multivitamin with minerals Tabs tablet Take 1 tablet by mouth daily.   oxyCODONE 5 MG immediate release tablet Commonly known as:  Oxy IR/ROXICODONE Take 5-10 mg by mouth every 6 (six) hours as needed. take 1 tablet for pain level 4-6, and 2 tablets for pain level 7-10   polyethylene glycol packet Commonly known as:  MIRALAX / GLYCOLAX Take 17 g by mouth daily.   rosuvastatin 5 MG tablet Commonly known as:  CRESTOR Take 5 mg by mouth 3 (three) times a week. On Monday, Wednesday and Friday   sodium chloride 0.65 % Soln nasal spray Commonly known as:  OCEAN Place 1 spray into both nostrils at bedtime.   vitamin B-12 1000 MCG tablet Commonly known as:  CYANOCOBALAMIN Take 1,000 mcg by mouth daily.       Review of Systems  Constitutional: Negative for activity change, appetite change, chills, diaphoresis and fever.  HENT: Negative for congestion, mouth sores, nosebleeds,  postnasal drip, sneezing, sore throat, trouble swallowing and voice change.   Respiratory: Negative for apnea, cough, choking, chest tightness, shortness of breath and wheezing.   Cardiovascular: Negative for chest pain, palpitations and leg swelling.  Gastrointestinal: Negative for abdominal distention, abdominal pain, constipation, diarrhea and nausea.  Genitourinary: Negative for difficulty urinating, dysuria, frequency and urgency.  Musculoskeletal: Positive for arthralgias (typical arthritis) and gait problem. Negative for back pain and myalgias.  Skin: Positive for wound. Negative for color change, pallor and rash.  Neurological: Negative for dizziness, tremors, syncope, speech difficulty, weakness, numbness and headaches.  Psychiatric/Behavioral: Negative for agitation and behavioral problems.  All other systems reviewed and are negative.   Vitals:   07/11/17 0500  BP: (!) 155/58  Pulse: 64  Resp: 16  Temp: 97.8 F (36.6 C)  SpO2: 98%  Weight: 119 lb 11.2 oz (54.3 kg)   Body mass index is 21.54 kg/m. Physical Exam  Constitutional: She is oriented to person, place, and time. Vital signs are normal. She appears well-developed and well-nourished. She is active and cooperative. She does not appear ill. No distress.  HENT:  Head: Normocephalic and atraumatic.  Mouth/Throat: Uvula is midline, oropharynx is clear and moist and mucous membranes are normal. Mucous membranes are not pale, not dry and not cyanotic.  Eyes: Pupils are equal, round, and reactive to light. Conjunctivae, EOM and lids are normal.  Neck: Trachea normal, normal range of motion and full passive range of motion without pain. Neck supple. No JVD present. No tracheal deviation, no edema and no erythema present. No thyromegaly present.  Cardiovascular: Regular rhythm, normal heart sounds, intact distal pulses and normal pulses. Bradycardia present. Exam reveals no gallop, no distant heart sounds and no friction rub.    No murmur heard. Pulses:      Dorsalis pedis pulses are 2+ on the right side, and 2+ on the left side.  Mild RLE non-pitting edema  Pulmonary/Chest: Effort normal. No accessory muscle usage. No respiratory distress. She has decreased breath sounds in the right lower field and  the left lower field. She has no wheezes. She has no rhonchi. She has no rales. She exhibits no tenderness.  Abdominal: Soft. Normal appearance and bowel sounds are normal. She exhibits no distension and no ascites. There is no tenderness.  Musculoskeletal: She exhibits no edema.       Right hip: She exhibits decreased range of motion, decreased strength, tenderness and laceration.  Expected osteoarthritis, stiffness; Bilateral Calves soft, supple. Negative Homan's Sign. B- pedal pulses equal  Neurological: She is alert and oriented to person, place, and time. She has normal strength. Gait abnormal.  Skin: Skin is warm and dry. Laceration noted. She is not diaphoretic. No cyanosis. No pallor. Nails show no clubbing.  Psychiatric: She has a normal mood and affect. Her speech is normal and behavior is normal. Judgment and thought content normal. Cognition and memory are normal.  Nursing note and vitals reviewed.   Labs reviewed: Basic Metabolic Panel: Recent Labs    06/22/17 0504 06/27/17 0645 07/03/17 1740  NA 136 143 139  K 3.9 3.7 3.6  CL 105 109 105  CO2 25 28 29   GLUCOSE 116* 104* 101*  BUN 22* 19 26*  CREATININE 0.92 0.58 0.67  CALCIUM 7.9* 8.6* 9.1   Liver Function Tests: Recent Labs    11/05/16 2121 06/27/17 0645  AST 26 22  ALT 23 11*  ALKPHOS 79 65  BILITOT 0.8 1.3*  PROT 6.4* 5.2*  ALBUMIN 3.6 2.6*   No results for input(s): LIPASE, AMYLASE in the last 8760 hours. No results for input(s): AMMONIA in the last 8760 hours. CBC: Recent Labs    06/26/17 0700 06/27/17 0645 07/03/17 1740  WBC 9.2 7.8 10.3  NEUTROABS 6.4 5.3 8.3*  HGB 7.3* 7.6* 9.9*  HCT 22.3* 24.0* 31.6*  MCV 92.7 93.3  97.1  PLT 223 269 318   Cardiac Enzymes: No results for input(s): CKTOTAL, CKMB, CKMBINDEX, TROPONINI in the last 8760 hours. BNP: Invalid input(s): POCBNP CBG: Recent Labs    11/07/16 1617  GLUCAP 135*    Procedures and Imaging Studies During Stay: Dg Chest 1 View  Result Date: 06/19/2017 CLINICAL DATA:  Hip pain after fall EXAM: CHEST  1 VIEW COMPARISON:  11/06/2016 FINDINGS: Cardiomegaly. Mild hyperinflation of the lungs. Mild vascular congestion. Small left pleural effusion with left base atelectasis. Calcified granuloma in the left lung base. IMPRESSION: Cardiomegaly with vascular congestion. Hyperinflation. Small left effusion with left base atelectasis. Electronically Signed   By: Rolm Baptise M.D.   On: 06/19/2017 14:34   Ct Head Wo Contrast  Result Date: 06/19/2017 CLINICAL DATA:  Fall EXAM: CT HEAD WITHOUT CONTRAST TECHNIQUE: Contiguous axial images were obtained from the base of the skull through the vertex without intravenous contrast. COMPARISON:  None. FINDINGS: Brain: No acute territorial infarction, hemorrhage or intracranial mass is visualized. Moderate atrophy. Moderate small vessel ischemic changes of the white matter. Prominent ventricle size, felt related to atrophy. Vascular: No hyperdense vessels.  Carotid vascular calcification. Skull: Normal. Negative for fracture or focal lesion. Sinuses/Orbits: No acute finding. Other: None IMPRESSION: 1. No CT evidence for acute intracranial abnormality. 2. Atrophy and moderate small vessel ischemic changes of the white matter Electronically Signed   By: Donavan Foil M.D.   On: 06/19/2017 14:20   Dg C-arm 1-60 Min  Result Date: 06/21/2017 CLINICAL DATA:  Femur fracture EXAM: DG C-ARM 61-120 MIN; RIGHT FEMUR 2 VIEWS COMPARISON:  06/19/2017 FINDINGS: Five low resolution intraoperative spot views of the right femur. Total fluoroscopy time was  1 minutes 48 seconds. Images demonstrate intramedullary rodding and screw fixation of the  right femur for intertrochanteric fracture. IMPRESSION: Fluoroscopic assistance provided during surgical fixation of proximal right femur fracture Electronically Signed   By: Donavan Foil M.D.   On: 06/21/2017 21:23   Dg Hip Unilat W Or Wo Pelvis 2-3 Views Right  Result Date: 06/19/2017 CLINICAL DATA:  Right hip pain after fall. EXAM: DG HIP (WITH OR WITHOUT PELVIS) 2-3V RIGHT COMPARISON:  None. FINDINGS: Severely displaced fracture is seen involving the intertrochanteric region of the proximal right femur. Right femoral head is well situated within acetabulum. IMPRESSION: Severely displaced intertrochanteric fracture of proximal right femur. Electronically Signed   By: Marijo Conception, M.D.   On: 06/19/2017 14:38   Dg Femur, Min 2 Views Right  Result Date: 06/21/2017 CLINICAL DATA:  Femur fracture EXAM: DG C-ARM 61-120 MIN; RIGHT FEMUR 2 VIEWS COMPARISON:  06/19/2017 FINDINGS: Five low resolution intraoperative spot views of the right femur. Total fluoroscopy time was 1 minutes 48 seconds. Images demonstrate intramedullary rodding and screw fixation of the right femur for intertrochanteric fracture. IMPRESSION: Fluoroscopic assistance provided during surgical fixation of proximal right femur fracture Electronically Signed   By: Donavan Foil M.D.   On: 06/21/2017 21:23    Assessment/Plan:   1. Closed fracture of right hip with routine healing, subsequent encounter 2. Status post hip surgery  Continue PT/OT  Continue exercises as taught by PT/OT  Continue ice pack to the hip prn for pain/ edema  Continue Eliquis 2.5 mg po BID for DVT prophylaxis  Continue Oxycodone 5 mg 1-2 tablets po Q 6 hours prn  Continue Tylenol 650 mg po Q 6 hours prn  Skin care per protocol  Follow up with orthopedist as instructed  3. Malnutrition of moderate degree  Continue Pro-Stat 30 mL po BID  Continue Ensure Enlive 1 bottle po BID   Patient is being discharged with the following home health  services: HHPT/[facility] through [facility]   Patient is being discharged with the following durable medical equipment: none needed   Patient has been advised to f/u with their PCP in 1-2 weeks to bring them up to date on their rehab stay.  Social services at facility was responsible for arranging this appointment.  Pt was provided with a 30 day supply of prescriptions for medications and refills must be obtained from their PCP.  For controlled substances, a more limited supply may be provided adequate until PCP appointment only.  Future labs/tests needed:    Family/ staff Communication:   Total Time:  Documentation:  Face to Face:  Family/Phone:  Vikki Ports, NP-C [facility] [address], [place] [address] Cell Phone (Mon-Fri 8am-5pm):  [phone] On Call:  [phone] & follow prompts after 5pm & weekends Office Phone:  [phone] Office Fax:  [phone]

## 2017-09-27 ENCOUNTER — Encounter: Payer: Self-pay | Admitting: Obstetrics and Gynecology

## 2017-09-27 ENCOUNTER — Ambulatory Visit: Payer: Medicare Other | Admitting: Obstetrics and Gynecology

## 2017-09-27 VITALS — BP 178/68 | HR 67 | Ht 62.5 in | Wt 117.3 lb

## 2017-09-27 DIAGNOSIS — S30814A Abrasion of vagina and vulva, initial encounter: Secondary | ICD-10-CM

## 2017-09-27 DIAGNOSIS — N8111 Cystocele, midline: Secondary | ICD-10-CM

## 2017-09-27 DIAGNOSIS — Z4689 Encounter for fitting and adjustment of other specified devices: Secondary | ICD-10-CM

## 2017-09-27 DIAGNOSIS — N815 Vaginal enterocele: Secondary | ICD-10-CM | POA: Diagnosis not present

## 2017-10-03 NOTE — Progress Notes (Signed)
GYN ENCOUNTER NOTE  Subjective:       Lisa [name] is a 84 y.o. No obstetric history on file. female is here for gynecologic evaluation of the following issues:  1.  Vaginal discharge and odor.   2.  History of pessary use-not evaluated in 1 year  Jacques presents today for pessary management.  She states that over the past several months she has noted an increased discharge and vaginal malodor.  She has not had any vaginal bleeding.  She really has a gel horn pessary for management of cystocele and enterocele.  She had been unable to return for evaluation recently because of multiple comorbidities from orthopedic issues including kyphoplasty and fractured hip repair with subsequent rehab   Gynecologic History No LMP recorded. Patient has had a hysterectomy. Contraception: post menopausal status  Obstetric History OB History  No data available    Past Medical History:  Diagnosis Date  . A-fib ([place])   . Allergic rhinitis    with chronic nasal congestion  . Anemia, unspecified   . Basal cell carcinoma   . Change in bowel habit 09/30/2016  . Chicken pox   . Constipation   . Cystocele   . Enthesopathy of hip region   . Fatigue 09/30/2016  . Fibrocystic breast disease   . Hyperlipemia   . Hypertension   . Incomplete bladder emptying   . Irregular heart beat    and shortness of breath with SVT, PAT, and ventricular ectopy   . Menopausal state   . PAC (premature atrial contraction)   . Palpitations   . Peripheral vascular disease ([place])   . PVC (premature ventricular contraction)   . Sinoatrial node dysfunction ([facility])   . Urethral caruncle   . Urinary urgency   . Varicella zoster 04/2007  . Weight loss, unintentional 09/30/2016    Past Surgical History:  Procedure Laterality Date  . ABLATION    . APPENDECTOMY    . BASAL CELL CARCINOMA EXCISION    . BREAST CYST ASPIRATION Left   . CARDIAC SURGERY    . CARDIOVERSION    . CATARACT EXTRACTION Bilateral 2015  .  CHOLECYSTECTOMY    . ELECTROPHYSIOLOGIC STUDY N/A 09/02/2015   Procedure: Cardioversion;  Surgeon: Corey Skains, MD;  Location: [facility];  Service: Cardiovascular;  Laterality: N/A;  . ELECTROPHYSIOLOGIC STUDY N/A 10/08/2015   Procedure: CARDIOVERSION;  Surgeon: Corey Skains, MD;  Location: [facility];  Service: Cardiovascular;  Laterality: N/A;  . HIP SURGERY    . INTRAMEDULLARY (IM) NAIL INTERTROCHANTERIC Right 06/21/2017   Procedure: INTRAMEDULLARY (IM) NAIL INTERTROCHANTRIC;  Surgeon: Dereck Leep, MD;  Location: [facility];  Service: Orthopedics;  Laterality: Right;  . KYPHOPLASTY N/A 11/08/2016   Procedure: [id number];  Surgeon: Hessie Knows, MD;  Location: [facility];  Service: Orthopedics;  Laterality: N/A;  . TOTAL ABDOMINAL HYSTERECTOMY W/ BILATERAL SALPINGOOPHORECTOMY    . WISDOM TOOTH EXTRACTION    . WRIST FRACTURE SURGERY Right     Current Outpatient Medications on File Prior to Visit  Medication Sig Dispense Refill  . acetaminophen (TYLENOL) 325 MG tablet Take 2 tablets (650 mg total) by mouth every 6 (six) hours as needed for mild pain (pain score 1-3 or temp > 100.5). 30 tablet 0  . cholecalciferol (VITAMIN D) 1000 units tablet Take 1,000 Units by mouth daily.    Marland Kitchen ELIQUIS 2.5 MG TABS tablet Take 2.5 mg by mouth 2 (two) times daily.   11  . ferrous sulfate 325 (65  FE) MG tablet Take 1 tablet (325 mg total) by mouth 2 (two) times daily with a meal. 60 tablet 0  . losartan (COZAAR) 25 MG tablet Take 25 mg by mouth daily.  5  . metoprolol tartrate (LOPRESSOR) 25 MG tablet Take 25 mg by mouth 2 (two) times daily.   11  . Multiple Vitamin (MULTIVITAMIN WITH MINERALS) TABS tablet Take 1 tablet by mouth daily. 30 tablet 2  . OXYQUINOLONE SULFATE VAGINAL 0.025 % GEL Place vaginally.    . polyethylene glycol (MIRALAX / GLYCOLAX) packet Take 17 g by mouth daily.    . rosuvastatin (CRESTOR) 5 MG tablet Take 5 mg by mouth 3 (three) times a week. On Monday, Wednesday and Friday     . sodium chloride (OCEAN) 0.65 % SOLN nasal spray Place 1 spray into both nostrils at bedtime.    . vitamin B-12 (CYANOCOBALAMIN) 1000 MCG tablet Take 1,000 mcg by mouth daily.     No current facility-administered medications on file prior to visit.     Allergies  Allergen Reactions  . Ace Inhibitors Cough  . Benadryl [Diphenhydramine Hcl (Sleep)]   . Diphenhydramine-Apap (Sleep) Other (See Comments)    Reaction: Couldn't use kidneys  . Tylenol Pm Extra [Diphenhydramine-Apap (Sleep)]     Reaction: Couldn't use kidneys    Social History   Socioeconomic History  . Marital status: Widowed    Spouse name: Not on file  . Number of children: 3  . Years of education: 16  . Highest education level: Bachelor's degree (e.g., BA, AB, BS)  Occupational History  . Not on file  Social Needs  . Financial resource strain: Not on file  . Food insecurity:    Worry: Not on file    Inability: Not on file  . Transportation needs:    Medical: Not on file    Non-medical: Not on file  Tobacco Use  . Smoking status: Never Smoker  . Smokeless tobacco: Never Used  Substance and Sexual Activity  . Alcohol use: No  . Drug use: No  . Sexual activity: Never  Lifestyle  . Physical activity:    Days per week: Not on file    Minutes per session: Not on file  . Stress: Not on file  Relationships  . Social connections:    Talks on phone: Not on file    Gets together: Not on file    Attends religious service: Not on file    Active member of club or organization: Not on file    Attends meetings of clubs or organizations: Not on file    Relationship status: Not on file  . Intimate partner violence:    Fear of current or ex partner: Not on file    Emotionally abused: Not on file    Physically abused: Not on file    Forced sexual activity: Not on file  Other Topics Concern  . Not on file  Social History Narrative   Lives independently, has a walker to ambulate.    Family History  Problem  Relation Age of Onset  . Heart failure Father   . Coronary artery disease Father   . Heart disease Father   . Heart attack Father   . Breast cancer Sister 54  . Ovarian cancer Sister   . Lung cancer Sister   . Uterine cancer Sister   . Heart failure Brother   . Anesthesia problems Mother        with hip surgery age 72's /  mental status change  . Hypertension Mother   . Colon cancer Neg Hx     The following portions of the patient's history were reviewed and updated as appropriate: allergies, current medications, past family history, past medical history, past social history, past surgical history and problem list.  Review of Systems Review of Systems  Constitutional: Negative for chills, diaphoresis and fever.  HENT: Negative.   Eyes: Negative.   Respiratory: Negative.   Cardiovascular: Negative.   Gastrointestinal: Negative for abdominal pain, blood in stool and constipation.  Genitourinary: Negative for dysuria, frequency and urgency.       Vaginal malodor Vaginal discharge  Musculoskeletal:       Status post hip surgery and rehab  Skin: Negative.   Neurological: Negative.   Endo/Heme/Allergies: Negative.   Psychiatric/Behavioral: Negative.      Objective:   BP (!) 178/68   Pulse 67   Ht 5' 2.5" (1.588 m)   Wt 117 lb 4.8 oz (53.2 kg)   BMI 21.11 kg/m  CONSTITUTIONAL: Well-developed, well-nourished female in no acute distress.  HENT:  Normocephalic, atraumatic.  NECK: Supple SKIN: Skin is warm and dry. No rash noted. Not diaphoretic. No erythema. No pallor. [place]: Alert and oriented to person, place, and time. PSYCHIATRIC: Normal mood and affect. Normal behavior. Normal judgment and thought content. CARDIOVASCULAR:Not Examined RESPIRATORY: Not Examined BREASTS: Not Examined ABDOMEN: Soft, non distended; Non tender.  No Organomegaly. PELVIC:  External Genitalia: Normal  BUS: Normal  Vagina: Vaginal erosion, left sidewall, friable; malodor present;  atrophic changes present  Cervix: Surgically absent  Uterus: Surgically absent  Adnexa: Nonpalpable and nontender  RV: Normal external exam  Bladder: Nontender MUSCULOSKELETAL: Normal range of motion. No tenderness.  No cyanosis, clubbing, or edema.  PROCEDURE: [place] pessary removal Pessary is removed with the aid of a [place] forceps.  The pessary is not reinserted because of vaginal abrasion     Assessment:   1. Pessary maintenance  2. Vaginal enterocele  3. Cystocele, midline  4. Abrasion of vagina, initial encounter     Plan:   1.  Pessary is removed and not reinserted 2.  Return in 1 month for follow-up or sooner if prolapse symptoms recur  A total of 15 minutes were spent face-to-face with the patient during this encounter and over half of that time dealt with counseling and coordination of care.  Brayton Mars, MD  Note: This dictation was prepared with Dragon dictation along with smaller phrase technology. Any transcriptional errors that result from this process are unintentional.

## 2017-10-03 NOTE — Patient Instructions (Signed)
1.  Pessary is removed today 2.  Return in 4 weeks for follow-up on vaginal ulcer/abrasion

## 2017-11-07 ENCOUNTER — Encounter: Payer: Medicare Other | Admitting: Obstetrics and Gynecology

## 2017-11-08 ENCOUNTER — Encounter: Payer: Medicare Other | Admitting: Obstetrics and Gynecology

## 2018-03-13 ENCOUNTER — Other Ambulatory Visit: Payer: Self-pay | Admitting: Physician Assistant

## 2018-03-13 DIAGNOSIS — Z1231 Encounter for screening mammogram for malignant neoplasm of breast: Secondary | ICD-10-CM

## 2018-04-25 ENCOUNTER — Ambulatory Visit
Admission: RE | Admit: 2018-04-25 | Discharge: 2018-04-25 | Disposition: A | Discharge status: Home / Self Care | Payer: Medicare Other | Source: Ambulatory Visit | Attending: Physician Assistant | Admitting: Physician Assistant

## 2018-04-25 DIAGNOSIS — Z1231 Encounter for screening mammogram for malignant neoplasm of breast: Secondary | ICD-10-CM | POA: Diagnosis present

## 2018-08-01 ENCOUNTER — Other Ambulatory Visit: Payer: Self-pay

## 2018-08-01 ENCOUNTER — Ambulatory Visit: Payer: Medicare Other | Admitting: Anesthesiology

## 2018-08-01 ENCOUNTER — Encounter
Admission: RE | Disposition: A | Discharge status: Home / Self Care | Payer: Self-pay | Source: Home / Self Care | Attending: Internal Medicine

## 2018-08-01 ENCOUNTER — Ambulatory Visit
Admission: RE | Admit: 2018-08-01 | Discharge: 2018-08-01 | Disposition: A | Discharge status: Home / Self Care | Payer: Medicare Other | Attending: Internal Medicine | Admitting: Internal Medicine

## 2018-08-01 ENCOUNTER — Encounter: Payer: Self-pay | Admitting: *Deleted

## 2018-08-01 DIAGNOSIS — Z7902 Long term (current) use of antithrombotics/antiplatelets: Secondary | ICD-10-CM | POA: Insufficient documentation

## 2018-08-01 DIAGNOSIS — Z8249 Family history of ischemic heart disease and other diseases of the circulatory system: Secondary | ICD-10-CM | POA: Insufficient documentation

## 2018-08-01 DIAGNOSIS — I739 Peripheral vascular disease, unspecified: Secondary | ICD-10-CM | POA: Insufficient documentation

## 2018-08-01 DIAGNOSIS — Z7983 Long term (current) use of bisphosphonates: Secondary | ICD-10-CM | POA: Diagnosis not present

## 2018-08-01 DIAGNOSIS — I10 Essential (primary) hypertension: Secondary | ICD-10-CM | POA: Insufficient documentation

## 2018-08-01 DIAGNOSIS — E785 Hyperlipidemia, unspecified: Secondary | ICD-10-CM | POA: Diagnosis not present

## 2018-08-01 DIAGNOSIS — Z7901 Long term (current) use of anticoagulants: Secondary | ICD-10-CM | POA: Insufficient documentation

## 2018-08-01 DIAGNOSIS — Z79899 Other long term (current) drug therapy: Secondary | ICD-10-CM | POA: Insufficient documentation

## 2018-08-01 DIAGNOSIS — Z85828 Personal history of other malignant neoplasm of skin: Secondary | ICD-10-CM | POA: Insufficient documentation

## 2018-08-01 DIAGNOSIS — I4892 Unspecified atrial flutter: Secondary | ICD-10-CM | POA: Insufficient documentation

## 2018-08-01 DIAGNOSIS — I480 Paroxysmal atrial fibrillation: Secondary | ICD-10-CM | POA: Diagnosis not present

## 2018-08-01 HISTORY — PX: CARDIOVERSION: SHX1299

## 2018-08-01 SURGERY — CARDIOVERSION
Anesthesia: General

## 2018-08-01 MED ORDER — PROPOFOL 10 MG/ML IV BOLUS
INTRAVENOUS | Status: DC | PRN
  Administered 2018-08-01: 30 mg via INTRAVENOUS

## 2018-08-01 MED ORDER — PROPOFOL 10 MG/ML IV BOLUS
INTRAVENOUS | Status: AC
  Filled 2018-08-01: qty 20

## 2018-08-01 MED ORDER — SODIUM CHLORIDE 0.9 % IV SOLN
INTRAVENOUS | Status: DC
  Administered 2018-08-01: 07:00:00 via INTRAVENOUS

## 2018-08-01 NOTE — Anesthesia Post-op Follow-up Note (Signed)
Anesthesia QCDR form completed.        

## 2018-08-01 NOTE — Sedation Documentation (Signed)
Anesthesia admin. meds for Cardioversion by Dr. Nehemiah Massed.

## 2018-08-01 NOTE — CV Procedure (Signed)
Electrical Cardioversion Procedure Note Lisa [name] [id number] 1928-12-18  Procedure: Electrical Cardioversion Indications:  Atrial Flutter  Procedure Details Consent: Risks of procedure as well as the alternatives and risks of each were explained to the (patient/caregiver).  Consent for procedure obtained. Time Out: Verified patient identification, verified procedure, site/side was marked, verified correct patient position, special equipment/implants available, medications/allergies/relevent history reviewed, required imaging and test results available.  Performed  Patient placed on cardiac monitor, pulse oximetry, supplemental oxygen as necessary.  Sedation given: Benzodiazepines Pacer pads placed anterior and posterior chest.  Cardioverted 1 time(s).  Cardioverted at 120J.  Evaluation Findings: Post procedure EKG shows: NSR Complications: None Patient did tolerate procedure well.   Corey Skains 08/01/2018, 7:45 AM

## 2018-08-01 NOTE — Anesthesia Procedure Notes (Signed)
Date/Time: 08/01/2018 7:40 AM Performed by: Nelda Marseille, CRNA Pre-anesthesia Checklist: Patient identified, Emergency Drugs available, Suction available, Patient being monitored and Timeout performed Oxygen Delivery Method: Nasal cannula

## 2018-08-01 NOTE — Transfer of Care (Signed)
Immediate Anesthesia Transfer of Care Note  Patient: Lisa [name]  Procedure(s) Performed: CARDIOVERSION (N/A )  Patient Location: PACU  Anesthesia Type:General  Level of Consciousness: awake and sedated  Airway & Oxygen Therapy: Patient Spontanous Breathing and Patient connected to nasal cannula oxygen  Post-op Assessment: Report given to RN and Post -op Vital signs reviewed and stable  Post vital signs: Reviewed and stable  Last Vitals:  Vitals Value Taken Time  BP 178/107 08/01/2018  7:37 AM  Temp    Pulse 99 08/01/2018  7:37 AM  Resp 18 08/01/2018  7:37 AM  SpO2 100 % 08/01/2018  7:37 AM    Last Pain:  Vitals:   08/01/18 0659  TempSrc: Oral  PainSc: 0-No pain         Complications: No apparent anesthesia complications

## 2018-08-01 NOTE — Anesthesia Postprocedure Evaluation (Signed)
Anesthesia Post Note  Patient: Lisa [name]  Procedure(s) Performed: CARDIOVERSION (N/A )  Patient location during evaluation: Other Anesthesia Type: General Level of consciousness: awake and alert and oriented Pain management: pain level controlled Vital Signs Assessment: post-procedure vital signs reviewed and stable Respiratory status: spontaneous breathing Cardiovascular status: blood pressure returned to baseline Anesthetic complications: no     Last Vitals:  Vitals:   08/01/18 0800 08/01/18 0815  BP: 115/79 (!) 136/52  Pulse: (!) 45 (!) 54  Resp: 15 16  Temp:    SpO2: 95% 100%    Last Pain:  Vitals:   08/01/18 0815  TempSrc:   PainSc: 0-No pain                 Floyce Bujak

## 2018-08-01 NOTE — Anesthesia Preprocedure Evaluation (Signed)
Anesthesia Evaluation  Patient identified by MRN, date of birth, ID band Patient awake    Reviewed: Allergy & Precautions, NPO status , Patient's Chart, lab work & pertinent test results  Airway Mallampati: II       Dental  (+) Implants   Pulmonary neg pulmonary ROS,    Pulmonary exam normal        Cardiovascular hypertension, Pt. on medications + Peripheral Vascular Disease  + dysrhythmias Atrial Fibrillation  Rhythm:Regular     Neuro/Psych negative neurological ROS  negative psych ROS   GI/Hepatic negative GI ROS, Neg liver ROS,   Endo/Other  negative endocrine ROS  Renal/GU negative Renal ROS     Musculoskeletal negative musculoskeletal ROS (+)   Abdominal Normal abdominal exam  (+)   Peds  Hematology negative hematology ROS (+) anemia ,   Anesthesia Other Findings Past Medical History: No date: A-fib ([place]) No date: Allergic rhinitis     Comment:  with chronic nasal congestion No date: Anemia, unspecified No date: Basal cell carcinoma 09/30/2016: Change in bowel habit No date: Chicken pox No date: Constipation No date: Cystocele No date: Enthesopathy of hip region 09/30/2016: Fatigue No date: Fibrocystic breast disease No date: Hyperlipemia No date: Hypertension No date: Incomplete bladder emptying No date: Irregular heart beat     Comment:  and shortness of breath with SVT, PAT, and ventricular               ectopy  No date: Menopausal state No date: PAC (premature atrial contraction) No date: Palpitations No date: Peripheral vascular disease ([facility]) No date: PVC (premature ventricular contraction) No date: Sinoatrial node dysfunction ([facility]) No date: Urethral caruncle No date: Urinary urgency 04/2007: Varicella zoster 09/30/2016: Weight loss, unintentional  Reproductive/Obstetrics                             Anesthesia Physical  Anesthesia Plan  ASA:  III  Anesthesia Plan: General   Post-op Pain Management:    Induction: Intravenous  PONV Risk Score and Plan:   Airway Management Planned: Natural Airway and Nasal Cannula  Additional Equipment:   Intra-op Plan:   Post-operative Plan:   Informed Consent: I have reviewed the patients History and Physical, chart, labs and discussed the procedure including the risks, benefits and alternatives for the proposed anesthesia with the patient or authorized representative who has indicated his/her understanding and acceptance.       Plan Discussed with: CRNA  Anesthesia Plan Comments:         Anesthesia Quick Evaluation

## 2020-07-16 ENCOUNTER — Other Ambulatory Visit: Payer: Self-pay | Admitting: Physician Assistant

## 2020-07-16 DIAGNOSIS — Z1231 Encounter for screening mammogram for malignant neoplasm of breast: Secondary | ICD-10-CM

## 2020-07-29 ENCOUNTER — Other Ambulatory Visit: Payer: Self-pay

## 2020-07-29 ENCOUNTER — Ambulatory Visit
Admission: RE | Admit: 2020-07-29 | Discharge: 2020-07-29 | Disposition: A | Discharge status: Home / Self Care | Payer: Medicare PPO | Source: Ambulatory Visit | Attending: Physician Assistant | Admitting: Physician Assistant

## 2020-07-29 DIAGNOSIS — Z1231 Encounter for screening mammogram for malignant neoplasm of breast: Secondary | ICD-10-CM | POA: Diagnosis not present

## 2021-10-27 ENCOUNTER — Other Ambulatory Visit (INDEPENDENT_AMBULATORY_CARE_PROVIDER_SITE_OTHER): Payer: Self-pay | Admitting: Nurse Practitioner

## 2021-10-27 DIAGNOSIS — I83228 Varicose veins of left lower extremity with both ulcer of other part of lower extremity and inflammation: Secondary | ICD-10-CM

## 2021-10-27 DIAGNOSIS — I83028 Varicose veins of left lower extremity with ulcer other part of lower leg: Secondary | ICD-10-CM

## 2021-10-28 ENCOUNTER — Ambulatory Visit (INDEPENDENT_AMBULATORY_CARE_PROVIDER_SITE_OTHER): Payer: Medicare PPO

## 2021-10-28 ENCOUNTER — Ambulatory Visit (INDEPENDENT_AMBULATORY_CARE_PROVIDER_SITE_OTHER): Payer: Medicare PPO | Admitting: Vascular Surgery

## 2021-10-28 ENCOUNTER — Encounter (INDEPENDENT_AMBULATORY_CARE_PROVIDER_SITE_OTHER): Payer: Self-pay | Admitting: Vascular Surgery

## 2021-10-28 VITALS — BP 128/76 | HR 92 | Resp 15 | Wt 133.8 lb

## 2021-10-28 DIAGNOSIS — I83028 Varicose veins of left lower extremity with ulcer other part of lower leg: Secondary | ICD-10-CM

## 2021-10-28 DIAGNOSIS — I480 Paroxysmal atrial fibrillation: Secondary | ICD-10-CM | POA: Diagnosis not present

## 2021-10-28 DIAGNOSIS — I83228 Varicose veins of left lower extremity with both ulcer of other part of lower extremity and inflammation: Secondary | ICD-10-CM

## 2021-10-28 DIAGNOSIS — L97829 Non-pressure chronic ulcer of other part of left lower leg with unspecified severity: Secondary | ICD-10-CM | POA: Diagnosis not present

## 2021-10-28 DIAGNOSIS — I10 Essential (primary) hypertension: Secondary | ICD-10-CM | POA: Diagnosis not present

## 2021-10-28 DIAGNOSIS — I890 Lymphedema, not elsewhere classified: Secondary | ICD-10-CM

## 2021-10-29 ENCOUNTER — Encounter (INDEPENDENT_AMBULATORY_CARE_PROVIDER_SITE_OTHER): Payer: Self-pay | Admitting: Vascular Surgery

## 2021-10-29 DIAGNOSIS — I890 Lymphedema, not elsewhere classified: Secondary | ICD-10-CM | POA: Insufficient documentation

## 2021-10-29 NOTE — Progress Notes (Signed)
MRN : [id number]  Lisa [name] is a 88 y.o. (Apr 19, 1928) female who presents with chief complaint of legs hurt and swell.  History of Present Illness: Patient is seen for evaluation of leg swelling. The patient first noticed the swelling remotely but is now concerned because of a significant increase in the overall edema. The swelling isn't associated with significant pain.  There has been an increasing amount of  discoloration noted by the patient. The patient notes that in the morning the legs are improved but they steadily worsened throughout the course of the day. Elevation seems to make the swelling of the legs better, dependency makes them much worse.   There is no history of ulcerations associated with the swelling.   The patient denies any recent changes in their medications.  The patient has not been wearing graduated compression.  The patient has no had any past angiography, interventions or vascular surgery.  The patient denies a history of DVT or PE. There is no prior history of phlebitis. There is no history of primary lymphedema.  There is no history of radiation treatment to the groin or pelvis No history of malignancies. No history of trauma or groin or pelvic surgery. No history of foreign travel or parasitic infections area  Patient is seen for evaluation of leg swelling. The patient first noticed the swelling remotely but is now concerned because of a significant increase in the overall edema. The swelling isn't associated with significant pain.  There has been an increasing amount of  discoloration noted by the patient. The patient notes that in the morning the legs are improved but they steadily worsened throughout the course of the day. Elevation seems to make the swelling of the legs better, dependency makes them much worse.   There is no history of ulcerations associated with the swelling.   The patient denies any recent changes in their  medications.  The patient has not been wearing graduated compression.  The patient has no had any past angiography, interventions or vascular surgery.  The patient denies a history of DVT or PE. There is no prior history of phlebitis. There is no history of primary lymphedema.  There is no history of radiation treatment to the groin or pelvis No history of malignancies. No history of trauma or groin or pelvic surgery. No history of foreign travel or parasitic infections area Patient is seen for evaluation of leg swelling. The patient first noticed the swelling remotely but is now concerned because of a significant increase in the overall edema. The swelling isn't associated with significant pain.  There has been an increasing amount of  discoloration noted by the patient. The patient notes that in the morning the legs are improved but they steadily worsened throughout the course of the day. Elevation seems to make the swelling of the legs better, dependency makes them much worse.   There is no history of ulcerations associated with the swelling.   The patient denies any recent changes in their medications.  The patient has not been wearing graduated compression.  The patient has no had any past angiography, interventions or vascular surgery.  The patient denies a history of DVT or PE. There is no prior history of phlebitis. There is no history of primary lymphedema.  There is no history of radiation treatment to the groin or pelvis No history of malignancies. No history of trauma or groin or pelvic surgery. No history  of foreign travel or parasitic infections area   Venous duplex ultrasound obtained today demonstrates normal deep system no evidence of stricture stenosis or chronic changes.  There is no significant reflux noted in the right or left lower extremity.  Current Meds  Medication Sig   acetaminophen (TYLENOL) 500 MG tablet Take 1,000 mg by mouth daily as needed for  moderate pain.   cholecalciferol (VITAMIN D) 1000 units tablet Take 2,000 Units by mouth daily.    ELIQUIS 2.5 MG TABS tablet Take 2.5 mg by mouth 2 (two) times daily.    metoprolol tartrate (LOPRESSOR) 50 MG tablet Take 50 mg by mouth 2 (two) times daily.   Multiple Vitamin (MULTIVITAMIN WITH MINERALS) TABS tablet Take 1 tablet by mouth daily.   polyethylene glycol (MIRALAX / GLYCOLAX) packet Take 17 g by mouth daily as needed for mild constipation.    potassium chloride (KLOR-CON) 10 MEQ tablet Take 10 mEq by mouth daily.   psyllium (METAMUCIL) 58.6 % packet Take 1 packet by mouth daily.   torsemide (DEMADEX) 10 MG tablet Take 10 mg by mouth daily.   vitamin B-12 (CYANOCOBALAMIN) 1000 MCG tablet Take 1,000 mcg by mouth daily.    Past Medical History:  Diagnosis Date   A-fib [facility])    Allergic rhinitis    with chronic nasal congestion   Anemia, unspecified    Basal cell carcinoma    Change in bowel habit 09/30/2016   Chicken pox    Constipation    Cystocele    Enthesopathy of hip region    Fatigue 09/30/2016   Fibrocystic breast disease    Hyperlipemia    Hypertension    Incomplete bladder emptying    Irregular heart beat    and shortness of breath with SVT, PAT, and ventricular ectopy    Menopausal state    PAC (premature atrial contraction)    Palpitations    Peripheral vascular disease (HCC)    PVC (premature ventricular contraction)    Sinoatrial node dysfunction (HCC)    Urethral caruncle    Urinary urgency    Varicella zoster 04/2007   Weight loss, unintentional 09/30/2016    Past Surgical History:  Procedure Laterality Date   ABLATION     APPENDECTOMY     BASAL CELL CARCINOMA EXCISION     BREAST CYST ASPIRATION Left    CARDIAC SURGERY     CARDIOVERSION     CARDIOVERSION N/A 08/01/2018   Procedure: CARDIOVERSION;  Surgeon: Corey Skains, MD;  Location: [facility] ORS;  Service: Cardiovascular;  Laterality: N/A;   CATARACT EXTRACTION Bilateral 2015    CHOLECYSTECTOMY     ELECTROPHYSIOLOGIC STUDY N/A 09/02/2015   Procedure: Cardioversion;  Surgeon: Corey Skains, MD;  Location: [facility] ORS;  Service: Cardiovascular;  Laterality: N/A;   ELECTROPHYSIOLOGIC STUDY N/A 10/08/2015   Procedure: CARDIOVERSION;  Surgeon: Corey Skains, MD;  Location: [facility] ORS;  Service: Cardiovascular;  Laterality: N/A;   HIP SURGERY     INTRAMEDULLARY (IM) NAIL INTERTROCHANTERIC Right 06/21/2017   Procedure: INTRAMEDULLARY (IM) NAIL INTERTROCHANTRIC;  Surgeon: Dereck Leep, MD;  Location: [facility] ORS;  Service: Orthopedics;  Laterality: Right;   KYPHOPLASTY N/A 11/08/2016   Procedure: [id number];  Surgeon: Hessie Knows, MD;  Location: [facility] ORS;  Service: Orthopedics;  Laterality: N/A;   TOTAL ABDOMINAL HYSTERECTOMY W/ BILATERAL SALPINGOOPHORECTOMY     WISDOM TOOTH EXTRACTION     WRIST FRACTURE SURGERY Right     Social History Social History   Tobacco Use  Smoking status: Never   Smokeless tobacco: Never  Vaping Use   Vaping Use: Never used  Substance Use Topics   Alcohol use: No   Drug use: No    Family History Family History  Problem Relation Age of Onset   Heart failure Father    Coronary artery disease Father    Heart disease Father    Heart attack Father    Breast cancer Sister 38   Ovarian cancer Sister    Lung cancer Sister    Uterine cancer Sister    Heart failure Brother    Anesthesia problems Mother        with hip surgery age 31's / mental status change   Hypertension Mother    Colon cancer Neg Hx     Allergies  Allergen Reactions   Ace Inhibitors Cough   Benadryl [Diphenhydramine]     Urinary retention    Trazodone     Other reaction(s): Other (See Comments) Unable to urinate     REVIEW OF SYSTEMS (Negative unless checked)  Constitutional: '[]'$ Weight loss  '[]'$ Fever  '[]'$ Chills Cardiac: '[]'$ Chest pain   '[]'$ Chest pressure   '[]'$ Palpitations   '[]'$ Shortness of breath when laying flat   '[]'$ Shortness of breath with  exertion. Vascular:  '[]'$ Pain in legs with walking   '[x]'$ Pain in legs at rest  '[]'$ History of DVT   '[]'$ Phlebitis   '[x]'$ Swelling in legs   '[]'$ Varicose veins   '[]'$ Non-healing ulcers Pulmonary:   '[]'$ Uses home oxygen   '[]'$ Productive cough   '[]'$ Hemoptysis   '[]'$ Wheeze  '[]'$ COPD   '[]'$ Asthma Neurologic:  '[]'$ Dizziness   '[]'$ Seizures   '[]'$ History of stroke   '[]'$ History of TIA  '[]'$ Aphasia   '[]'$ Vissual changes   '[]'$ Weakness or numbness in arm   '[]'$ Weakness or numbness in leg Musculoskeletal:   '[]'$ Joint swelling   '[]'$ Joint pain   '[]'$ Low back pain Hematologic:  '[]'$ Easy bruising  '[]'$ Easy bleeding   '[]'$ Hypercoagulable state   '[]'$ Anemic Gastrointestinal:  '[]'$ Diarrhea   '[]'$ Vomiting  '[]'$ Gastroesophageal reflux/heartburn   '[]'$ Difficulty swallowing. Genitourinary:  '[]'$ Chronic kidney disease   '[]'$ Difficult urination  '[]'$ Frequent urination   '[]'$ Blood in urine Skin:  '[]'$ Rashes   '[]'$ Ulcers  Psychological:  '[]'$ History of anxiety   '[]'$  History of major depression.  Physical Examination  Vitals:   10/28/21 1134  BP: 128/76  Pulse: 92  Resp: 15  Weight: 133 lb 12.8 oz (60.7 kg)   Body mass index is 24.47 kg/m. Gen: WD/WN, NAD Head: [place]/AT, No temporalis wasting.  Ear/Nose/Throat: Hearing grossly intact, nares w/o erythema or drainage, pinna without lesions Eyes: PER, EOMI, sclera nonicteric.  Neck: Supple, no gross masses.  No JVD.  Pulmonary:  Good air movement, no audible wheezing, no use of accessory muscles.  Cardiac: RRR, precordium not hyperdynamic. Vascular:  scattered small varicosities present bilaterally.  Mild venous stasis changes to the legs bilaterally.  1-2+ soft pitting edema  Vessel Right Left  Radial Palpable Palpable  Gastrointestinal: soft, non-distended. No guarding/no peritoneal signs.  Musculoskeletal: M/S 5/5 throughout.  No deformity.  Neurologic: CN 2-12 intact. Pain and light touch intact in extremities.  Symmetrical.  Speech is fluent. Motor exam as listed above. Psychiatric: Judgment intact, Mood & affect appropriate for pt's  clinical situation. Dermatologic: Venous rashes no ulcers noted.  No changes consistent with cellulitis. Lymph : No lichenification or skin changes of chronic lymphedema.  CBC Lab Results  Component Value Date   WBC 10.3 07/03/2017   HGB 9.9 (L) 07/03/2017   HCT 31.6 (L) 07/03/2017  MCV 97.1 07/03/2017   PLT 318 07/03/2017    BMET    Component Value Date/Time   NA 139 07/03/2017 1740   K 3.6 07/03/2017 1740   CL 105 07/03/2017 1740   CO2 29 07/03/2017 1740   GLUCOSE 101 (H) 07/03/2017 1740   BUN 26 (H) 07/03/2017 1740   CREATININE 0.67 07/03/2017 1740   CALCIUM 9.1 07/03/2017 1740   GFRNONAA >60 07/03/2017 1740   GFRAA >60 07/03/2017 1740   CrCl cannot be calculated (Patient's most recent lab result is older than the maximum 21 days allowed.).  COAG Lab Results  Component Value Date   INR 1.09 06/19/2017    Radiology No results found.   Assessment/Plan 1. Lymphedema Recommend:  No surgery or intervention at this point in time.  I have reviewed my discussion with the patient regarding venous insufficiency and why it causes symptoms. I have discussed with the patient the chronic skin changes that accompany venous insufficiency and the long term sequela such as ulceration. Patient will contnue wearing graduated compression stockings on a daily basis, as this has provided excellent control of his edema. The patient will put the stockings on first thing in the morning and removing them in the evening. The patient is reminded not to sleep in the stockings.  In addition, behavioral modification including elevation during the day will be initiated. Exercise is strongly encouraged.  Previous duplex ultrasound of the lower extremities shows normal deep system, no significant superficial reflux was identified.  Given the patient's good control and lack of any problems regarding the venous insufficiency and lymphedema a lymph pump in not need at this time.    The patient  will follow up with me PRN should anything change.  The patient voices agreement with this plan.   2. Benign essential HTN Continue antihypertensive medications as already ordered, these medications have been reviewed and there are no changes at this time.   3. AF (paroxysmal atrial fibrillation) (HCC) Continue antiarrhythmia medications as already ordered, these medications have been reviewed and there are no changes at this time.  Continue anticoagulation as ordered by Cardiology Service     Hortencia Pilar, MD  10/29/2021 4:04 PM

## 2021-11-11 ENCOUNTER — Telehealth (INDEPENDENT_AMBULATORY_CARE_PROVIDER_SITE_OTHER): Payer: Self-pay

## 2021-11-11 NOTE — Telephone Encounter (Signed)
Pt calls in concerned about a place on her leg that is open and oozing, she is wearing the compression wraps with the liner underneath.  I spoke with Dr Delana Meyer and he states that she needs to be added to the schedule on Monday.  Please schedule this appt.

## 2021-11-12 NOTE — Telephone Encounter (Signed)
Appt has been sheduled

## 2021-11-14 NOTE — Progress Notes (Signed)
MRN : [id number]  Lisa [name] is a 88 y.o. (02/09/1929) female who presents with chief complaint of legs swell.  History of Present Illness:  Patient is seen for evaluation of leg pain and swelling associated with new onset ulceration. The patient first noticed the swelling remotely. The swelling is associated with pain and discoloration. The pain and swelling worsens with prolonged dependency and improves with elevation. The pain is unrelated to activity.  The patient notes that in the morning the legs are better but the leg symptoms worsened throughout the course of the day. The patient has also noted a progressive worsening of the discoloration in the ankle and shin area.   The patient notes that the bilateral ulcers have developed acutely without specific trauma and since it occurred it has been very slow to heal.  There is a moderate amount of drainage associated with the open area.  The wound is also very painful.  The patient denies claudication symptoms or rest pain symptoms.   The patient has not had any past angiography, interventions or vascular surgery.  Elevation makes the leg symptoms better, dependency makes them much worse. The patient denies any recent changes in medications.  The patient has not been wearing graduated compression.  The patient denies a history of DVT or PE. There is no prior history of phlebitis. There is no history of primary lymphedema.  No history of malignancies. No history of trauma or groin or pelvic surgery. There is no history of radiation treatment to the groin or pelvis         No outpatient medications have been marked as taking for the 11/15/21 encounter (Appointment) with Delana Meyer, Dolores Lory, MD.    Past Medical History:  Diagnosis Date   A-fib [facility])    Allergic rhinitis    with chronic nasal congestion   Anemia, unspecified    Basal cell carcinoma    Change in bowel habit 09/30/2016   Chicken pox    Constipation     Cystocele    Enthesopathy of hip region    Fatigue 09/30/2016   Fibrocystic breast disease    Hyperlipemia    Hypertension    Incomplete bladder emptying    Irregular heart beat    and shortness of breath with SVT, PAT, and ventricular ectopy    Menopausal state    PAC (premature atrial contraction)    Palpitations    Peripheral vascular disease (HCC)    PVC (premature ventricular contraction)    Sinoatrial node dysfunction (HCC)    Urethral caruncle    Urinary urgency    Varicella zoster 04/2007   Weight loss, unintentional 09/30/2016    Past Surgical History:  Procedure Laterality Date   ABLATION     APPENDECTOMY     BASAL CELL CARCINOMA EXCISION     BREAST CYST ASPIRATION Left    CARDIAC SURGERY     CARDIOVERSION     CARDIOVERSION N/A 08/01/2018   Procedure: CARDIOVERSION;  Surgeon: Corey Skains, MD;  Location: [facility] ORS;  Service: Cardiovascular;  Laterality: N/A;   CATARACT EXTRACTION Bilateral 2015   CHOLECYSTECTOMY     ELECTROPHYSIOLOGIC STUDY N/A 09/02/2015   Procedure: Cardioversion;  Surgeon: Corey Skains, MD;  Location: [facility] ORS;  Service: Cardiovascular;  Laterality: N/A;   ELECTROPHYSIOLOGIC STUDY N/A 10/08/2015   Procedure: CARDIOVERSION;  Surgeon: Corey Skains, MD;  Location: [facility] ORS;  Service: Cardiovascular;  Laterality: N/A;   HIP SURGERY  INTRAMEDULLARY (IM) NAIL INTERTROCHANTERIC Right 06/21/2017   Procedure: INTRAMEDULLARY (IM) NAIL INTERTROCHANTRIC;  Surgeon: Dereck Leep, MD;  Location: [facility] ORS;  Service: Orthopedics;  Laterality: Right;   KYPHOPLASTY N/A 11/08/2016   Procedure: [id number];  Surgeon: Hessie Knows, MD;  Location: [facility] ORS;  Service: Orthopedics;  Laterality: N/A;   TOTAL ABDOMINAL HYSTERECTOMY W/ BILATERAL SALPINGOOPHORECTOMY     WISDOM TOOTH EXTRACTION     WRIST FRACTURE SURGERY Right     Social History Social History   Tobacco Use   Smoking status: Never   Smokeless tobacco: Never  Vaping Use   Vaping  Use: Never used  Substance Use Topics   Alcohol use: No   Drug use: No    Family History Family History  Problem Relation Age of Onset   Heart failure Father    Coronary artery disease Father    Heart disease Father    Heart attack Father    Breast cancer Sister 59   Ovarian cancer Sister    Lung cancer Sister    Uterine cancer Sister    Heart failure Brother    Anesthesia problems Mother        with hip surgery age 56's / mental status change   Hypertension Mother    Colon cancer Neg Hx     Allergies  Allergen Reactions   Ace Inhibitors Cough   Benadryl [Diphenhydramine]     Urinary retention    Trazodone     Other reaction(s): Other (See Comments) Unable to urinate     REVIEW OF SYSTEMS (Negative unless checked)  Constitutional: '[]'$ Weight loss  '[]'$ Fever  '[]'$ Chills Cardiac: '[]'$ Chest pain   '[]'$ Chest pressure   '[]'$ Palpitations   '[]'$ Shortness of breath when laying flat   '[]'$ Shortness of breath with exertion. Vascular:  '[]'$ Pain in legs with walking   '[x]'$ Pain in legs with standing  '[]'$ History of DVT   '[]'$ Phlebitis   '[x]'$ Swelling in legs   '[]'$ Varicose veins   '[]'$ Non-healing ulcers Pulmonary:   '[]'$ Uses home oxygen   '[]'$ Productive cough   '[]'$ Hemoptysis   '[]'$ Wheeze  '[]'$ COPD   '[]'$ Asthma Neurologic:  '[]'$ Dizziness   '[]'$ Seizures   '[]'$ History of stroke   '[]'$ History of TIA  '[]'$ Aphasia   '[]'$ Vissual changes   '[]'$ Weakness or numbness in arm   '[]'$ Weakness or numbness in leg Musculoskeletal:   '[]'$ Joint swelling   '[]'$ Joint pain   '[]'$ Low back pain Hematologic:  '[]'$ Easy bruising  '[]'$ Easy bleeding   '[]'$ Hypercoagulable state   '[]'$ Anemic Gastrointestinal:  '[]'$ Diarrhea   '[]'$ Vomiting  '[]'$ Gastroesophageal reflux/heartburn   '[]'$ Difficulty swallowing. Genitourinary:  '[]'$ Chronic kidney disease   '[]'$ Difficult urination  '[]'$ Frequent urination   '[]'$ Blood in urine Skin:  '[]'$ Rashes   '[]'$ Ulcers  Psychological:  '[]'$ History of anxiety   '[]'$  History of major depression.  Physical Examination  There were no vitals filed for this visit. There is no  height or weight on file to calculate BMI. Gen: WD/WN, NAD Head: [place]/AT, No temporalis wasting.  Ear/Nose/Throat: Hearing grossly intact, nares w/o erythema or drainage, pinna without lesions Eyes: PER, EOMI, sclera nonicteric.  Neck: Supple, no gross masses.  No JVD.  Pulmonary:  Good air movement, no audible wheezing, no use of accessory muscles.  Cardiac: RRR, precordium not hyperdynamic. Vascular:  Small weeping ulcers bilaterally.  Severe venous stasis changes to the legs bilaterally.  3-4+ soft pitting edema  Vessel Right Left  Radial Palpable Palpable  Gastrointestinal: soft, non-distended. No guarding/no peritoneal signs.  Musculoskeletal: M/S 5/5 throughout.  No deformity.  Neurologic: CN 2-12 intact.  Pain and light touch intact in extremities.  Symmetrical.  Speech is fluent. Motor exam as listed above. Psychiatric: Judgment intact, Mood & affect appropriate for pt's clinical situation. Dermatologic: Venous rashes no ulcers noted.  No changes consistent with cellulitis. Lymph : No lichenification or skin changes of chronic lymphedema.  CBC Lab Results  Component Value Date   WBC 10.3 07/03/2017   HGB 9.9 (L) 07/03/2017   HCT 31.6 (L) 07/03/2017   MCV 97.1 07/03/2017   PLT 318 07/03/2017    BMET    Component Value Date/Time   NA 139 07/03/2017 1740   K 3.6 07/03/2017 1740   CL 105 07/03/2017 1740   CO2 29 07/03/2017 1740   GLUCOSE 101 (H) 07/03/2017 1740   BUN 26 (H) 07/03/2017 1740   CREATININE 0.67 07/03/2017 1740   CALCIUM 9.1 07/03/2017 1740   GFRNONAA >60 07/03/2017 1740   GFRAA >60 07/03/2017 1740   CrCl cannot be calculated (Patient's most recent lab result is older than the maximum 21 days allowed.).  COAG Lab Results  Component Value Date   INR 1.09 06/19/2017    Radiology VAS [place] LOWER EXTREMITY VENOUS REFLUX  Result Date: 11/04/2021  Lower Venous Reflux Study Patient Name:  LEZETTE KITTS [facility]  Date of Exam:   10/28/2021 Medical Rec #: [id number]           Accession #:    [id number] Date of Birth: 10-Oct-1928          Patient Gender: F Patient Age:   60 years Exam Location:  [facility] Procedure:      VAS [place] LOWER EXTREMITY VENOUS REFLUX Referring Phys: Eulogio Ditch --------------------------------------------------------------------------------  Indications: Pain, Swelling, Erythema, and varicosities. Other Indications: A fib. Performing Technologist: Delorise Shiner RVT  Examination Guidelines: A complete evaluation includes B-mode imaging, spectral Doppler, color Doppler, and power Doppler as needed of all accessible portions of each vessel. Bilateral testing is considered an integral part of a complete examination. Limited examinations for reoccurring indications may be performed as noted. The reflux portion of the exam is performed with the patient in reverse Trendelenburg. Significant venous reflux is defined as >500 ms in the superficial venous system, and >1 second in the deep venous system.  Venous Reflux Times +--------------+---------+------+-----------+------------+--------+ RIGHT         Reflux NoRefluxReflux TimeDiameter cmsComments                         Yes                                  +--------------+---------+------+-----------+------------+--------+ CFV                     yes   >1 second                      +--------------+---------+------+-----------+------------+--------+ FV prox       no                                             +--------------+---------+------+-----------+------------+--------+ FV mid        no                                             +--------------+---------+------+-----------+------------+--------+  FV dist       no                                             +--------------+---------+------+-----------+------------+--------+ Popliteal     no                                             +--------------+---------+------+-----------+------------+--------+  GSV at [facility]    no                            0.71             +--------------+---------+------+-----------+------------+--------+ GSV prox thighno                            0.48             +--------------+---------+------+-----------+------------+--------+ GSV mid thigh no                            0.49             +--------------+---------+------+-----------+------------+--------+ GSV dist thighno                            0.42             +--------------+---------+------+-----------+------------+--------+ GSV at knee   no                            0.37             +--------------+---------+------+-----------+------------+--------+ GSV prox calf no                            0.35             +--------------+---------+------+-----------+------------+--------+ SSV Pop Fossa no                            0.24             +--------------+---------+------+-----------+------------+--------+ SSV prox calf no                            0.20             +--------------+---------+------+-----------+------------+--------+ SSV mid calf  no                            0.18             +--------------+---------+------+-----------+------------+--------+  +--------------+---------+------+-----------+------------+--------+ LEFT          Reflux NoRefluxReflux TimeDiameter cmsComments                         Yes                                  +--------------+---------+------+-----------+------------+--------+ CFV  no                                             +--------------+---------+------+-----------+------------+--------+ FV prox       no                                             +--------------+---------+------+-----------+------------+--------+ FV mid        no                                             +--------------+---------+------+-----------+------------+--------+ FV dist       no                                              +--------------+---------+------+-----------+------------+--------+ Popliteal     no                                             +--------------+---------+------+-----------+------------+--------+ GSV at [facility]    no                            0.86             +--------------+---------+------+-----------+------------+--------+ GSV prox thighno                            0.52             +--------------+---------+------+-----------+------------+--------+ GSV mid thigh no                            0.48             +--------------+---------+------+-----------+------------+--------+ GSV dist thighno                            0.43             +--------------+---------+------+-----------+------------+--------+ GSV at knee   no                            0.38             +--------------+---------+------+-----------+------------+--------+ GSV prox calf no                            0.43             +--------------+---------+------+-----------+------------+--------+ SSV Pop Fossa no                            0.24             +--------------+---------+------+-----------+------------+--------+ SSV prox calf no  0.16             +--------------+---------+------+-----------+------------+--------+ SSV mid calf  no                            0.23             +--------------+---------+------+-----------+------------+--------+   Summary: Right: - No evidence of deep vein thrombosis seen in the right lower extremity, from the common femoral through the popliteal veins. - No evidence of superficial venous thrombosis in the right lower extremity. - Venous reflux is noted in the right common femoral vein. - Venous Doppler signals are pulsatile bilaterally.  Left: - No evidence of deep vein thrombosis seen in the left lower extremity, from the common femoral through the popliteal veins. - No evidence of superficial  venous thrombosis in the left lower extremity. - There is no evidence of venous reflux seen in the left lower extremity. - Venous Doppler signals are pulsatile bilaterally.  *See table(s) above for measurements and observations. Electronically signed by Hortencia Pilar MD on 11/04/2021 at 4:07:36 PM.    Final      Assessment/Plan 1. Lymphedema No surgery or intervention at this point in time.    I have had a long discussion with the patient regarding venous insufficiency and why it  causes symptoms, specifically venous ulceration. I have discussed with the patient the chronic skin changes that accompany venous insufficiency and the long term sequela such as infection and recurring  ulceration.  Patient will be placed in [place] bilaterally which will be changed weekly drainage permitting.  In addition, behavioral modification including several periods of elevation of the lower extremities during the day will be continued. Achieving a position with the ankles at heart level was stressed to the patient  The patient is instructed to begin routine exercise, especially walking on a daily basis  In the future the patient can be assessed for graduated compression stockings or wraps as well as a Lymph Pump once the ulcers are healed.   2. Bilateral carotid artery stenosis Recommend:  Given the patient's asymptomatic subcritical stenosis no further invasive testing or surgery at this time.  Continue antiplatelet therapy as prescribed Continue management of CAD, HTN and Hyperlipidemia Healthy heart diet,  encouraged exercise at least 4 times per week Follow up in 6 months with duplex ultrasound and physical exam    3. Benign essential HTN Continue antihypertensive medications as already ordered, these medications have been reviewed and there are no changes at this time.   4. AF (paroxysmal atrial fibrillation) (HCC) Continue antiarrhythmia medications as already ordered, these medications have  been reviewed and there are no changes at this time.  Continue anticoagulation as ordered by Cardiology Service     Hortencia Pilar, MD  11/14/2021 4:33 PM

## 2021-11-15 ENCOUNTER — Encounter (INDEPENDENT_AMBULATORY_CARE_PROVIDER_SITE_OTHER): Payer: Medicare PPO | Admitting: Vascular Surgery

## 2021-11-15 ENCOUNTER — Encounter (INDEPENDENT_AMBULATORY_CARE_PROVIDER_SITE_OTHER): Payer: Self-pay | Admitting: Vascular Surgery

## 2021-11-15 ENCOUNTER — Encounter (INDEPENDENT_AMBULATORY_CARE_PROVIDER_SITE_OTHER): Payer: Medicare PPO

## 2021-11-15 ENCOUNTER — Ambulatory Visit (INDEPENDENT_AMBULATORY_CARE_PROVIDER_SITE_OTHER): Payer: Medicare PPO | Admitting: Vascular Surgery

## 2021-11-15 VITALS — BP 137/77 | HR 90 | Resp 16 | Wt 133.0 lb

## 2021-11-15 DIAGNOSIS — I480 Paroxysmal atrial fibrillation: Secondary | ICD-10-CM

## 2021-11-15 DIAGNOSIS — I6523 Occlusion and stenosis of bilateral carotid arteries: Secondary | ICD-10-CM

## 2021-11-15 DIAGNOSIS — I10 Essential (primary) hypertension: Secondary | ICD-10-CM | POA: Diagnosis not present

## 2021-11-15 DIAGNOSIS — I890 Lymphedema, not elsewhere classified: Secondary | ICD-10-CM

## 2021-11-21 ENCOUNTER — Encounter (INDEPENDENT_AMBULATORY_CARE_PROVIDER_SITE_OTHER): Payer: Self-pay | Admitting: Vascular Surgery

## 2021-11-22 ENCOUNTER — Encounter (INDEPENDENT_AMBULATORY_CARE_PROVIDER_SITE_OTHER): Payer: Self-pay | Admitting: Vascular Surgery

## 2021-11-22 ENCOUNTER — Ambulatory Visit (INDEPENDENT_AMBULATORY_CARE_PROVIDER_SITE_OTHER): Payer: Medicare PPO | Admitting: Vascular Surgery

## 2021-11-22 DIAGNOSIS — I83029 Varicose veins of left lower extremity with ulcer of unspecified site: Secondary | ICD-10-CM | POA: Diagnosis not present

## 2021-11-22 DIAGNOSIS — L97909 Non-pressure chronic ulcer of unspecified part of unspecified lower leg with unspecified severity: Secondary | ICD-10-CM | POA: Diagnosis not present

## 2021-11-22 DIAGNOSIS — I83019 Varicose veins of right lower extremity with ulcer of unspecified site: Secondary | ICD-10-CM

## 2021-11-22 DIAGNOSIS — I83009 Varicose veins of unspecified lower extremity with ulcer of unspecified site: Secondary | ICD-10-CM | POA: Diagnosis not present

## 2021-11-22 NOTE — Progress Notes (Signed)
History of Present Illness  There is no documented history at this time  Assessments & Plan   There are no diagnoses linked to this encounter.    Additional instructions  Subjective:  Patient presents with venous ulcer of the Bilateral lower extremity.    Procedure:  3 layer unna wrap was placed Bilateral lower extremity.   Plan:   Follow up in one week.  

## 2021-11-26 ENCOUNTER — Encounter: Payer: Self-pay | Admitting: *Deleted

## 2021-11-30 ENCOUNTER — Encounter (INDEPENDENT_AMBULATORY_CARE_PROVIDER_SITE_OTHER): Payer: Self-pay

## 2021-11-30 ENCOUNTER — Ambulatory Visit (INDEPENDENT_AMBULATORY_CARE_PROVIDER_SITE_OTHER): Payer: Medicare PPO | Admitting: Nurse Practitioner

## 2021-11-30 ENCOUNTER — Encounter (INDEPENDENT_AMBULATORY_CARE_PROVIDER_SITE_OTHER): Payer: Self-pay | Admitting: Vascular Surgery

## 2021-11-30 VITALS — BP 137/79 | HR 79 | Resp 16 | Wt 135.6 lb

## 2021-11-30 DIAGNOSIS — I83029 Varicose veins of left lower extremity with ulcer of unspecified site: Secondary | ICD-10-CM | POA: Diagnosis not present

## 2021-11-30 DIAGNOSIS — L97909 Non-pressure chronic ulcer of unspecified part of unspecified lower leg with unspecified severity: Secondary | ICD-10-CM | POA: Diagnosis not present

## 2021-11-30 DIAGNOSIS — I83009 Varicose veins of unspecified lower extremity with ulcer of unspecified site: Secondary | ICD-10-CM

## 2021-11-30 DIAGNOSIS — I83019 Varicose veins of right lower extremity with ulcer of unspecified site: Secondary | ICD-10-CM | POA: Diagnosis not present

## 2021-11-30 NOTE — Progress Notes (Signed)
History of Present Illness  There is no documented history at this time  Assessments & Plan   There are no diagnoses linked to this encounter.    Additional instructions  Subjective:  Patient presents with venous ulcer of the Bilateral lower extremity.    Procedure:  3 layer unna wrap was placed Bilateral lower extremity.   Plan:   Follow up in one week.  

## 2021-12-03 ENCOUNTER — Encounter (INDEPENDENT_AMBULATORY_CARE_PROVIDER_SITE_OTHER): Payer: Self-pay | Admitting: Nurse Practitioner

## 2021-12-06 ENCOUNTER — Encounter (INDEPENDENT_AMBULATORY_CARE_PROVIDER_SITE_OTHER): Payer: Self-pay

## 2021-12-06 ENCOUNTER — Ambulatory Visit (INDEPENDENT_AMBULATORY_CARE_PROVIDER_SITE_OTHER): Payer: Medicare PPO | Admitting: Nurse Practitioner

## 2021-12-06 VITALS — BP 134/75 | HR 83 | Resp 16 | Wt 134.6 lb

## 2021-12-06 DIAGNOSIS — L97909 Non-pressure chronic ulcer of unspecified part of unspecified lower leg with unspecified severity: Secondary | ICD-10-CM

## 2021-12-06 DIAGNOSIS — I83009 Varicose veins of unspecified lower extremity with ulcer of unspecified site: Secondary | ICD-10-CM

## 2021-12-06 DIAGNOSIS — I83019 Varicose veins of right lower extremity with ulcer of unspecified site: Secondary | ICD-10-CM

## 2021-12-06 DIAGNOSIS — I83029 Varicose veins of left lower extremity with ulcer of unspecified site: Secondary | ICD-10-CM | POA: Diagnosis not present

## 2021-12-06 NOTE — Progress Notes (Signed)
History of Present Illness  There is no documented history at this time  Assessments & Plan   There are no diagnoses linked to this encounter.    Additional instructions  Subjective:  Patient presents with venous ulcer of the Bilateral lower extremity.    Procedure:  3 layer unna wrap was placed Bilateral lower extremity.   Plan:   Follow up in one week.  

## 2021-12-13 ENCOUNTER — Ambulatory Visit (INDEPENDENT_AMBULATORY_CARE_PROVIDER_SITE_OTHER): Payer: Medicare PPO | Admitting: Nurse Practitioner

## 2021-12-13 VITALS — BP 145/82 | Ht 62.0 in | Wt 138.0 lb

## 2021-12-13 DIAGNOSIS — I83009 Varicose veins of unspecified lower extremity with ulcer of unspecified site: Secondary | ICD-10-CM | POA: Diagnosis not present

## 2021-12-13 DIAGNOSIS — I83029 Varicose veins of left lower extremity with ulcer of unspecified site: Secondary | ICD-10-CM

## 2021-12-13 DIAGNOSIS — L97909 Non-pressure chronic ulcer of unspecified part of unspecified lower leg with unspecified severity: Secondary | ICD-10-CM

## 2021-12-13 DIAGNOSIS — I83019 Varicose veins of right lower extremity with ulcer of unspecified site: Secondary | ICD-10-CM | POA: Diagnosis not present

## 2021-12-13 NOTE — Progress Notes (Signed)
History of Present Illness  There is no documented history at this time  Assessments & Plan   There are no diagnoses linked to this encounter.    Additional instructions  Subjective:  Patient presents with venous ulcer of the Bilateral lower extremity.    Procedure:  3 layer unna wrap was placed Bilateral lower extremity.   Plan:   Follow up in one week.  

## 2021-12-18 ENCOUNTER — Encounter (INDEPENDENT_AMBULATORY_CARE_PROVIDER_SITE_OTHER): Payer: Self-pay | Admitting: Nurse Practitioner

## 2021-12-21 ENCOUNTER — Ambulatory Visit (INDEPENDENT_AMBULATORY_CARE_PROVIDER_SITE_OTHER): Payer: Medicare PPO | Admitting: Nurse Practitioner

## 2021-12-21 ENCOUNTER — Encounter (INDEPENDENT_AMBULATORY_CARE_PROVIDER_SITE_OTHER): Payer: Self-pay | Admitting: Nurse Practitioner

## 2021-12-21 VITALS — BP 138/80 | HR 84 | Resp 18 | Ht 62.5 in | Wt 135.4 lb

## 2021-12-21 DIAGNOSIS — I83009 Varicose veins of unspecified lower extremity with ulcer of unspecified site: Secondary | ICD-10-CM

## 2021-12-21 DIAGNOSIS — I10 Essential (primary) hypertension: Secondary | ICD-10-CM | POA: Diagnosis not present

## 2021-12-21 DIAGNOSIS — L97909 Non-pressure chronic ulcer of unspecified part of unspecified lower leg with unspecified severity: Secondary | ICD-10-CM

## 2021-12-22 ENCOUNTER — Encounter (INDEPENDENT_AMBULATORY_CARE_PROVIDER_SITE_OTHER): Payer: Self-pay | Admitting: Nurse Practitioner

## 2021-12-22 NOTE — Progress Notes (Signed)
Subjective:    Patient ID: Lisa [name], female    DOB: 1928/08/04, 88 y.o.   MRN: [id number] No chief complaint on file.   Lisa [name] is a 88 year old female who returns today for follow-up evaluation of her bilateral lower extremity lymphedema as well as venous ulcerations.  Today the ulcerations appear mostly healed but she has significant areas of dry skin over the healed wounds.  This is concerning and that subsequent removal may result in open wounds.  There are some notable stasis dermatitis bilaterally.  The swelling is much improved.    Review of Systems  Cardiovascular:  Positive for leg swelling.  Musculoskeletal:  Positive for gait problem.  Skin:  Negative for wound.  All other systems reviewed and are negative.      Objective:   Physical Exam Vitals reviewed.  HENT:     Head: Normocephalic.  Cardiovascular:     Rate and Rhythm: Normal rate.     Pulses: Normal pulses.  Pulmonary:     Effort: Pulmonary effort is normal.  Skin:    General: Skin is warm and dry.  Neurological:     Mental Status: She is alert and oriented to person, place, and time.     Gait: Gait abnormal.  Psychiatric:        Mood and Affect: Mood normal.        Behavior: Behavior normal.        Thought Content: Thought content normal.        Judgment: Judgment normal.     BP 138/80 (BP Location: Right Arm)   Pulse 84   Resp 18   Ht 5' 2.5" (1.588 m)   Wt 135 lb 6.4 oz (61.4 kg)   BMI 24.37 kg/m   Past Medical History:  Diagnosis Date   A-fib (HCC)    Allergic rhinitis    with chronic nasal congestion   Anemia, unspecified    Basal cell carcinoma    Change in bowel habit 09/30/2016   Chicken pox    Constipation    Cystocele    Enthesopathy of hip region    Fatigue 09/30/2016   Fibrocystic breast disease    Hyperlipemia    Hypertension    Incomplete bladder emptying    Irregular heart beat    and shortness of breath with SVT, PAT, and ventricular ectopy     Menopausal state    PAC (premature atrial contraction)    Palpitations    Peripheral vascular disease (HCC)    PVC (premature ventricular contraction)    Sinoatrial node dysfunction (HCC)    Urethral caruncle    Urinary urgency    Varicella zoster 04/2007   Weight loss, unintentional 09/30/2016    Social History   Socioeconomic History   Marital status: Widowed    Spouse name: Not on file   Number of children: 3   Years of education: 16   Highest education level: Bachelor's degree (e.g., BA, AB, BS)  Occupational History   Not on file  Tobacco Use   Smoking status: Never   Smokeless tobacco: Never  Vaping Use   Vaping Use: Never used  Substance and Sexual Activity   Alcohol use: No   Drug use: No   Sexual activity: Never  Other Topics Concern   Not on file  Social History Narrative   Lives independently, has a walker to ambulate.   Social Determinants of Health   Financial Resource Strain: Not on file  Food Insecurity:  Not on file  Transportation Needs: Not on file  Physical Activity: Not on file  Stress: Not on file  Social Connections: Not on file  Intimate Partner Violence: Not on file    Past Surgical History:  Procedure Laterality Date   ABLATION     APPENDECTOMY     BASAL CELL CARCINOMA EXCISION     BREAST CYST ASPIRATION Left    [place] N/A 08/01/2018   Procedure: CARDIOVERSION;  Surgeon: Corey Skains, MD;  Location: [facility];  Service: Cardiovascular;  Laterality: N/A;   CATARACT EXTRACTION Bilateral 2015   CHOLECYSTECTOMY     ELECTROPHYSIOLOGIC STUDY N/A 09/02/2015   Procedure: Cardioversion;  Surgeon: Corey Skains, MD;  Location: [facility];  Service: Cardiovascular;  Laterality: N/A;   ELECTROPHYSIOLOGIC STUDY N/A 10/08/2015   Procedure: CARDIOVERSION;  Surgeon: Corey Skains, MD;  Location: [facility];  Service: Cardiovascular;  Laterality: N/A;   HIP SURGERY     INTRAMEDULLARY (IM) NAIL  INTERTROCHANTERIC Right 06/21/2017   Procedure: INTRAMEDULLARY (IM) NAIL INTERTROCHANTRIC;  Surgeon: Dereck Leep, MD;  Location: [facility];  Service: Orthopedics;  Laterality: Right;   KYPHOPLASTY N/A 11/08/2016   Procedure: [id number];  Surgeon: Hessie Knows, MD;  Location: [facility];  Service: Orthopedics;  Laterality: N/A;   TOTAL ABDOMINAL HYSTERECTOMY W/ BILATERAL SALPINGOOPHORECTOMY     WISDOM TOOTH EXTRACTION     WRIST FRACTURE SURGERY Right     Family History  Problem Relation Age of Onset   Heart failure Father    Coronary artery disease Father    Heart disease Father    Heart attack Father    Breast cancer Sister 17   Ovarian cancer Sister    Lung cancer Sister    Uterine cancer Sister    Heart failure Brother    Anesthesia problems Mother        with hip surgery age 40's / mental status change   Hypertension Mother    Colon cancer Neg Hx     Allergies  Allergen Reactions   Ace Inhibitors Cough   Benadryl [Diphenhydramine]     Urinary retention    Trazodone     Other reaction(s): Other (See Comments) Unable to urinate       Latest Ref Rng & Units 07/03/2017    5:40 PM 06/27/2017    6:45 AM 06/26/2017    7:00 AM  CBC  WBC 3.6 - 11.0 K/uL 10.3  7.8  9.2   Hemoglobin 12.0 - 16.0 g/dL 9.9  7.6  7.3   Hematocrit 35.0 - 47.0 % 31.6  24.0  22.3   Platelets 150 - 440 K/uL 318  269  223       CMP     Component Value Date/Time   NA 139 07/03/2017 1740   K 3.6 07/03/2017 1740   CL 105 07/03/2017 1740   CO2 29 07/03/2017 1740   GLUCOSE 101 (H) 07/03/2017 1740   BUN 26 (H) 07/03/2017 1740   CREATININE 0.67 07/03/2017 1740   CALCIUM 9.1 07/03/2017 1740   PROT 5.2 (L) 06/27/2017 0645   ALBUMIN 2.6 (L) 06/27/2017 0645   AST 22 06/27/2017 0645   ALT 11 (L) 06/27/2017 0645   ALKPHOS 65 06/27/2017 0645   BILITOT 1.3 (H) 06/27/2017 0645   GFRNONAA >60 07/03/2017 1740   GFRAA >60 07/03/2017 1740     No results found.     Assessment & Plan:  1. Venous  ulcer ([place]) We will continue to have the patient in Unna boots changed on a weekly basis.  We will not do this for the next several weeks to allow the wound to heal somewhat further so that she will not ulcerate when we transition to compression socks  2. Benign essential HTN Continue antihypertensive medications as already ordered, these medications have been reviewed and there are no changes at this time.    Current Outpatient Medications on File Prior to Visit  Medication Sig Dispense Refill   acetaminophen (TYLENOL) 500 MG tablet Take 1,000 mg by mouth daily as needed for moderate pain.     cholecalciferol (VITAMIN D) 1000 units tablet Take 2,000 Units by mouth daily.      ELIQUIS 2.5 MG TABS tablet Take 2.5 mg by mouth 2 (two) times daily.   11   metoprolol tartrate (LOPRESSOR) 50 MG tablet Take 50 mg by mouth 2 (two) times daily.     Multiple Vitamin (MULTIVITAMIN WITH MINERALS) TABS tablet Take 1 tablet by mouth daily. 30 tablet 2   polyethylene glycol (MIRALAX / GLYCOLAX) packet Take 17 g by mouth daily as needed for mild constipation.      potassium chloride (KLOR-CON) 10 MEQ tablet Take 10 mEq by mouth daily.     psyllium (METAMUCIL) 58.6 % packet Take 1 packet by mouth daily.     torsemide (DEMADEX) 10 MG tablet Take 10 mg by mouth daily.     vitamin B-12 (CYANOCOBALAMIN) 1000 MCG tablet Take 1,000 mcg by mouth daily.     alendronate (FOSAMAX) 70 MG tablet Take 70 mg by mouth every Monday. Take with a full glass of water on an empty stomach. (Patient not taking: Reported on 10/28/2021)     bismuth subsalicylate (PEPTO BISMOL) 262 MG/15ML suspension Take 30 mLs by mouth every 6 (six) hours as needed for indigestion. (Patient not taking: Reported on 10/28/2021)     ferrous sulfate 325 (65 FE) MG tablet Take 1 tablet (325 mg total) by mouth 2 (two) times daily with a meal. (Patient not taking: Reported on 07/24/2018) 60 tablet 0   Melatonin 5 MG CAPS Take 5 mg by mouth at bedtime. (Patient  not taking: Reported on 10/28/2021)     rosuvastatin (CRESTOR) 5 MG tablet Take 5 mg by mouth every other day.  (Patient not taking: Reported on 10/28/2021)     Turmeric 500 MG CAPS Take 500 mg by mouth every evening. (Patient not taking: Reported on 10/28/2021)     No current facility-administered medications on file prior to visit.    There are no Patient Instructions on file for this visit. No follow-ups on file.   Kris Hartmann, NP

## 2021-12-28 ENCOUNTER — Encounter (INDEPENDENT_AMBULATORY_CARE_PROVIDER_SITE_OTHER): Payer: Self-pay

## 2021-12-28 ENCOUNTER — Ambulatory Visit (INDEPENDENT_AMBULATORY_CARE_PROVIDER_SITE_OTHER): Payer: Medicare PPO | Admitting: Nurse Practitioner

## 2021-12-28 VITALS — BP 123/72 | HR 72 | Resp 16 | Wt 133.8 lb

## 2021-12-28 DIAGNOSIS — L97909 Non-pressure chronic ulcer of unspecified part of unspecified lower leg with unspecified severity: Secondary | ICD-10-CM

## 2021-12-28 DIAGNOSIS — I83019 Varicose veins of right lower extremity with ulcer of unspecified site: Secondary | ICD-10-CM

## 2021-12-28 DIAGNOSIS — I83009 Varicose veins of unspecified lower extremity with ulcer of unspecified site: Secondary | ICD-10-CM | POA: Diagnosis not present

## 2021-12-28 DIAGNOSIS — L97919 Non-pressure chronic ulcer of unspecified part of right lower leg with unspecified severity: Secondary | ICD-10-CM | POA: Diagnosis not present

## 2021-12-28 NOTE — Progress Notes (Signed)
History of Present Illness  There is no documented history at this time  Assessments & Plan   There are no diagnoses linked to this encounter.    Additional instructions  Subjective:  Patient presents with venous ulcer of the Bilateral lower extremity.    Procedure:  3 layer unna wrap was placed Bilateral lower extremity.   Plan:   Follow up in one week.  

## 2022-01-02 ENCOUNTER — Encounter (INDEPENDENT_AMBULATORY_CARE_PROVIDER_SITE_OTHER): Payer: Self-pay | Admitting: Nurse Practitioner

## 2022-01-04 ENCOUNTER — Ambulatory Visit (INDEPENDENT_AMBULATORY_CARE_PROVIDER_SITE_OTHER): Payer: Medicare PPO | Admitting: Nurse Practitioner

## 2022-01-04 ENCOUNTER — Encounter (INDEPENDENT_AMBULATORY_CARE_PROVIDER_SITE_OTHER): Payer: Self-pay

## 2022-01-04 VITALS — BP 112/72 | HR 76 | Resp 16 | Wt 134.4 lb

## 2022-01-04 DIAGNOSIS — L97909 Non-pressure chronic ulcer of unspecified part of unspecified lower leg with unspecified severity: Secondary | ICD-10-CM | POA: Diagnosis not present

## 2022-01-04 DIAGNOSIS — I83019 Varicose veins of right lower extremity with ulcer of unspecified site: Secondary | ICD-10-CM | POA: Diagnosis not present

## 2022-01-04 DIAGNOSIS — I83009 Varicose veins of unspecified lower extremity with ulcer of unspecified site: Secondary | ICD-10-CM

## 2022-01-04 NOTE — Progress Notes (Unsigned)
History of Present Illness  There is no documented history at this time  Assessments & Plan   There are no diagnoses linked to this encounter.    Additional instructions  Subjective:  Patient presents with venous ulcer of the Bilateral lower extremity.    Procedure:  3 layer unna wrap was placed Bilateral lower extremity.   Plan:   Follow up in one week.  

## 2022-01-05 ENCOUNTER — Encounter (INDEPENDENT_AMBULATORY_CARE_PROVIDER_SITE_OTHER): Payer: Self-pay | Admitting: Nurse Practitioner

## 2022-01-11 ENCOUNTER — Ambulatory Visit (INDEPENDENT_AMBULATORY_CARE_PROVIDER_SITE_OTHER): Payer: Medicare PPO | Admitting: Nurse Practitioner

## 2022-01-11 VITALS — BP 138/84 | HR 80 | Resp 16 | Ht 62.0 in | Wt 137.0 lb

## 2022-01-11 DIAGNOSIS — I83009 Varicose veins of unspecified lower extremity with ulcer of unspecified site: Secondary | ICD-10-CM | POA: Diagnosis not present

## 2022-01-11 DIAGNOSIS — L97909 Non-pressure chronic ulcer of unspecified part of unspecified lower leg with unspecified severity: Secondary | ICD-10-CM

## 2022-01-11 NOTE — Progress Notes (Signed)
History of Present Illness  There is no documented history at this time  Assessments & Plan   There are no diagnoses linked to this encounter.    Additional instructions  Subjective:  Patient presents with venous ulcer of the Bilateral lower extremity.    Procedure:  3 layer unna wrap was placed Bilateral lower extremity.   Plan:   Follow up in one week.  

## 2022-01-16 ENCOUNTER — Encounter (INDEPENDENT_AMBULATORY_CARE_PROVIDER_SITE_OTHER): Payer: Self-pay | Admitting: Nurse Practitioner

## 2022-01-18 ENCOUNTER — Encounter (INDEPENDENT_AMBULATORY_CARE_PROVIDER_SITE_OTHER): Payer: Self-pay

## 2022-01-18 ENCOUNTER — Ambulatory Visit (INDEPENDENT_AMBULATORY_CARE_PROVIDER_SITE_OTHER): Payer: Medicare PPO | Admitting: Nurse Practitioner

## 2022-01-18 VITALS — BP 119/74 | HR 64 | Resp 18 | Ht 62.0 in | Wt 137.0 lb

## 2022-01-18 DIAGNOSIS — I83009 Varicose veins of unspecified lower extremity with ulcer of unspecified site: Secondary | ICD-10-CM

## 2022-01-18 DIAGNOSIS — L97929 Non-pressure chronic ulcer of unspecified part of left lower leg with unspecified severity: Secondary | ICD-10-CM | POA: Diagnosis not present

## 2022-01-18 DIAGNOSIS — I83019 Varicose veins of right lower extremity with ulcer of unspecified site: Secondary | ICD-10-CM

## 2022-01-18 DIAGNOSIS — L97909 Non-pressure chronic ulcer of unspecified part of unspecified lower leg with unspecified severity: Secondary | ICD-10-CM | POA: Diagnosis not present

## 2022-01-18 NOTE — Progress Notes (Signed)
History of Present Illness  There is no documented history at this time  Assessments & Plan   There are no diagnoses linked to this encounter.    Additional instructions  Subjective:  Patient presents with venous ulcer of the Bilateral lower extremity.    Procedure:  3 layer unna wrap was placed Bilateral lower extremity.   Plan:   Follow up in one week.  

## 2022-01-26 ENCOUNTER — Ambulatory Visit: Payer: Self-pay | Admitting: Urology

## 2022-01-27 ENCOUNTER — Ambulatory Visit (INDEPENDENT_AMBULATORY_CARE_PROVIDER_SITE_OTHER): Payer: Medicare PPO | Admitting: Vascular Surgery

## 2022-01-27 ENCOUNTER — Encounter (INDEPENDENT_AMBULATORY_CARE_PROVIDER_SITE_OTHER): Payer: Self-pay | Admitting: Vascular Surgery

## 2022-01-27 VITALS — BP 123/84 | HR 75 | Resp 16 | Wt 137.6 lb

## 2022-01-27 DIAGNOSIS — I480 Paroxysmal atrial fibrillation: Secondary | ICD-10-CM

## 2022-01-27 DIAGNOSIS — I890 Lymphedema, not elsewhere classified: Secondary | ICD-10-CM

## 2022-01-27 DIAGNOSIS — I872 Venous insufficiency (chronic) (peripheral): Secondary | ICD-10-CM

## 2022-01-27 DIAGNOSIS — I6523 Occlusion and stenosis of bilateral carotid arteries: Secondary | ICD-10-CM | POA: Diagnosis not present

## 2022-01-27 DIAGNOSIS — I10 Essential (primary) hypertension: Secondary | ICD-10-CM | POA: Diagnosis not present

## 2022-01-31 ENCOUNTER — Encounter (INDEPENDENT_AMBULATORY_CARE_PROVIDER_SITE_OTHER): Payer: Self-pay | Admitting: Vascular Surgery

## 2022-01-31 DIAGNOSIS — I872 Venous insufficiency (chronic) (peripheral): Secondary | ICD-10-CM | POA: Insufficient documentation

## 2022-01-31 NOTE — Progress Notes (Signed)
MRN : [id number]  Lisa [name] is a 88 y.o. (13-Feb-1929) female who presents with chief complaint of legs hurt and swell.  History of Present Illness:   Patient is seen for evaluation of leg pain and leg swelling. The patient first noticed the swelling remotely. The swelling is associated with pain and discoloration. The pain and swelling worsens with prolonged dependency and improves with elevation. The pain is unrelated to activity.  The patient notes that in the morning the legs are significantly improved but they steadily worsened throughout the course of the day. The patient also notes a steady worsening of the discoloration in the ankle and shin area.   The patient denies claudication symptoms.  The patient denies symptoms consistent with rest pain.  The patient denies and extensive history of DJD and LS spine disease.  The patient has no had any past angiography, interventions or vascular surgery.  Elevation makes the leg symptoms better, dependency makes them much worse. There is no history of ulcerations. The patient denies any recent changes in medications.  The patient has not been wearing graduated compression.  The patient denies a history of DVT or PE. There is no prior history of phlebitis. There is no history of primary lymphedema.  No history of malignancies. No history of trauma or groin or pelvic surgery. There is no history of radiation treatment to the groin or pelvis  The patient denies amaurosis fugax or recent TIA symptoms. There are no recent neurological changes noted. The patient denies recent episodes of angina or shortness of breath  Current Meds  Medication Sig   acetaminophen (TYLENOL) 500 MG tablet Take 1,000 mg by mouth daily as needed for moderate pain.   cholecalciferol (VITAMIN D) 1000 units tablet Take 2,000 Units by mouth daily.    ELIQUIS 2.5 MG TABS tablet Take 2.5 mg by mouth 2 (two) times daily.    metoprolol tartrate  (LOPRESSOR) 50 MG tablet Take 50 mg by mouth 2 (two) times daily.   Multiple Vitamin (MULTIVITAMIN WITH MINERALS) TABS tablet Take 1 tablet by mouth daily.   polyethylene glycol (MIRALAX / GLYCOLAX) packet Take 17 g by mouth daily as needed for mild constipation.    potassium chloride (KLOR-CON) 10 MEQ tablet Take 10 mEq by mouth daily.   psyllium (METAMUCIL) 58.6 % packet Take 1 packet by mouth daily.   torsemide (DEMADEX) 10 MG tablet Take 10 mg by mouth daily.    Past Medical History:  Diagnosis Date   A-fib [facility])    Allergic rhinitis    with chronic nasal congestion   Anemia, unspecified    Basal cell carcinoma    Change in bowel habit 09/30/2016   Chicken pox    Constipation    Cystocele    Enthesopathy of hip region    Fatigue 09/30/2016   Fibrocystic breast disease    Hyperlipemia    Hypertension    Incomplete bladder emptying    Irregular heart beat    and shortness of breath with SVT, PAT, and ventricular ectopy    Menopausal state    PAC (premature atrial contraction)    Palpitations    Peripheral vascular disease (HCC)    PVC (premature ventricular contraction)    Sinoatrial node dysfunction (HCC)    Urethral caruncle    Urinary urgency    Varicella zoster 04/2007   Weight loss, unintentional 09/30/2016    Past Surgical History:  Procedure Laterality  Date   ABLATION     APPENDECTOMY     BASAL CELL CARCINOMA EXCISION     BREAST CYST ASPIRATION Left    CARDIAC SURGERY     CARDIOVERSION     CARDIOVERSION N/A 08/01/2018   Procedure: CARDIOVERSION;  Surgeon: Corey Skains, MD;  Location: [facility];  Service: Cardiovascular;  Laterality: N/A;   CATARACT EXTRACTION Bilateral 2015   CHOLECYSTECTOMY     ELECTROPHYSIOLOGIC STUDY N/A 09/02/2015   Procedure: Cardioversion;  Surgeon: Corey Skains, MD;  Location: [facility];  Service: Cardiovascular;  Laterality: N/A;   ELECTROPHYSIOLOGIC STUDY N/A 10/08/2015   Procedure: CARDIOVERSION;  Surgeon: Corey Skains,  MD;  Location: [facility];  Service: Cardiovascular;  Laterality: N/A;   HIP SURGERY     INTRAMEDULLARY (IM) NAIL INTERTROCHANTERIC Right 06/21/2017   Procedure: INTRAMEDULLARY (IM) NAIL INTERTROCHANTRIC;  Surgeon: Dereck Leep, MD;  Location: [facility];  Service: Orthopedics;  Laterality: Right;   KYPHOPLASTY N/A 11/08/2016   Procedure: [id number];  Surgeon: Hessie Knows, MD;  Location: [facility];  Service: Orthopedics;  Laterality: N/A;   TOTAL ABDOMINAL HYSTERECTOMY W/ BILATERAL SALPINGOOPHORECTOMY     WISDOM TOOTH EXTRACTION     WRIST FRACTURE SURGERY Right     Social History Social History   Tobacco Use   Smoking status: Never   Smokeless tobacco: Never  Vaping Use   Vaping Use: Never used  Substance Use Topics   Alcohol use: No   Drug use: No    Family History Family History  Problem Relation Age of Onset   Heart failure Father    Coronary artery disease Father    Heart disease Father    Heart attack Father    Breast cancer Sister 76   Ovarian cancer Sister    Lung cancer Sister    Uterine cancer Sister    Heart failure Brother    Anesthesia problems Mother        with hip surgery age 19's / mental status change   Hypertension Mother    Colon cancer Neg Hx     Allergies  Allergen Reactions   Ace Inhibitors Cough   Benadryl [Diphenhydramine]     Urinary retention    Trazodone     Other reaction(s): Other (See Comments) Unable to urinate     REVIEW OF SYSTEMS (Negative unless checked)  Constitutional: '[]'$ Weight loss  '[]'$ Fever  '[]'$ Chills Cardiac: '[]'$ Chest pain   '[]'$ Chest pressure   '[]'$ Palpitations   '[]'$ Shortness of breath when laying flat   '[]'$ Shortness of breath with exertion. Vascular:  '[]'$ Pain in legs with walking   '[x]'$ Pain in legs at rest  '[]'$ History of DVT   '[]'$ Phlebitis   '[x]'$ Swelling in legs   '[]'$ Varicose veins   '[]'$ Non-healing ulcers Pulmonary:   '[]'$ Uses home oxygen   '[]'$ Productive cough   '[]'$ Hemoptysis   '[]'$ Wheeze  '[]'$ COPD   '[]'$ Asthma Neurologic:  '[]'$ Dizziness    '[]'$ Seizures   '[]'$ History of stroke   '[]'$ History of TIA  '[]'$ Aphasia   '[]'$ Vissual changes   '[]'$ Weakness or numbness in arm   '[]'$ Weakness or numbness in leg Musculoskeletal:   '[]'$ Joint swelling   '[]'$ Joint pain   '[]'$ Low back pain Hematologic:  '[]'$ Easy bruising  '[]'$ Easy bleeding   '[]'$ Hypercoagulable state   '[]'$ Anemic Gastrointestinal:  '[]'$ Diarrhea   '[]'$ Vomiting  '[]'$ Gastroesophageal reflux/heartburn   '[]'$ Difficulty swallowing. Genitourinary:  '[]'$ Chronic kidney disease   '[]'$ Difficult urination  '[]'$ Frequent urination   '[]'$ Blood in urine Skin:  '[]'$ Rashes   '[]'$ Ulcers  Psychological:  '[]'$ History of anxiety   '[]'$   History of major depression.  Physical Examination  Vitals:   01/27/22 1450  BP: 123/84  Pulse: 75  Resp: 16  Weight: 137 lb 9.6 oz (62.4 kg)   Body mass index is 25.17 kg/m. Gen: WD/WN, NAD Head: [place]/AT, No temporalis wasting.  Ear/Nose/Throat: Hearing grossly intact, nares w/o erythema or drainage, pinna without lesions Eyes: PER, EOMI, sclera nonicteric.  Neck: Supple, no gross masses.  No JVD.  Pulmonary:  Good air movement, no audible wheezing, no use of accessory muscles.  Cardiac: RRR, precordium not hyperdynamic. Vascular:  scattered varicosities present bilaterally.  Severe venous stasis changes to the legs bilaterally.  3-4+ soft pitting edema  Vessel Right Left  Radial Palpable Palpable  Gastrointestinal: soft, non-distended. No guarding/no peritoneal signs.  Musculoskeletal: M/S 5/5 throughout.  No deformity.  Neurologic: CN 2-12 intact. Pain and light touch intact in extremities.  Symmetrical.  Speech is fluent. Motor exam as listed above. Psychiatric: Judgment intact, Mood & affect appropriate for pt's clinical situation. Dermatologic: Venous rashes no ulcers noted.  No changes consistent with cellulitis. Lymph : No lichenification or skin changes of chronic lymphedema.  CBC Lab Results  Component Value Date   WBC 10.3 07/03/2017   HGB 9.9 (L) 07/03/2017   HCT 31.6 (L) 07/03/2017   MCV 97.1  07/03/2017   PLT 318 07/03/2017    BMET    Component Value Date/Time   NA 139 07/03/2017 1740   K 3.6 07/03/2017 1740   CL 105 07/03/2017 1740   CO2 29 07/03/2017 1740   GLUCOSE 101 (H) 07/03/2017 1740   BUN 26 (H) 07/03/2017 1740   CREATININE 0.67 07/03/2017 1740   CALCIUM 9.1 07/03/2017 1740   GFRNONAA >60 07/03/2017 1740   GFRAA >60 07/03/2017 1740   CrCl cannot be calculated (Patient's most recent lab result is older than the maximum 21 days allowed.).  COAG Lab Results  Component Value Date   INR 1.09 06/19/2017    Radiology No results found.   Assessment/Plan 1. Chronic venous insufficiency Recommend:  I have had a long discussion with the patient regarding swelling and why it  causes symptoms.  Patient will begin wearing graduated compression on a daily basis a prescription was given. The patient will  wear the stockings first thing in the morning and removing them in the evening. The patient is instructed specifically not to sleep in the stockings.   In addition, behavioral modification will be initiated.  This will include frequent elevation, use of over the counter pain medications and exercise such as walking.  Consideration for a lymph pump will also be made based upon the effectiveness of conservative therapy.  This would help to improve the edema control and prevent sequela such as ulcers and infections   Patient should undergo duplex ultrasound of the venous system to ensure that DVT or reflux is not present.  The patient will follow-up with me after the ultrasound.   2. Lymphedema Recommend:  I have had a long discussion with the patient regarding swelling and why it  causes symptoms.  Patient will begin wearing graduated compression on a daily basis a prescription was given. The patient will  wear the stockings first thing in the morning and removing them in the evening. The patient is instructed specifically not to sleep in the stockings.   In  addition, behavioral modification will be initiated.  This will include frequent elevation, use of over the counter pain medications and exercise such as walking.  Consideration for a  lymph pump will also be made based upon the effectiveness of conservative therapy.  This would help to improve the edema control and prevent sequela such as ulcers and infections   Patient should undergo duplex ultrasound of the venous system to ensure that DVT or reflux is not present.  The patient will follow-up with me after the ultrasound.   3. Bilateral carotid artery stenosis Recommend:  Given the patient's asymptomatic subcritical stenosis no further invasive testing or surgery at this time.  Continue antiplatelet therapy as prescribed Continue management of CAD, HTN and Hyperlipidemia Healthy heart diet,  encouraged exercise at least 4 times per week Follow up in 12 months with duplex ultrasound and physical exam   4. Benign essential HTN Continue antihypertensive medications as already ordered, these medications have been reviewed and there are no changes at this time.  5. AF (paroxysmal atrial fibrillation) (HCC) Continue antiarrhythmia medications as already ordered, these medications have been reviewed and there are no changes at this time.  Continue anticoagulation as ordered by Cardiology Service    Hortencia Pilar, MD  01/31/2022 11:19 AM

## 2022-02-10 ENCOUNTER — Ambulatory Visit: Payer: Medicare PPO | Admitting: Urology

## 2022-02-10 DIAGNOSIS — N362 Urethral caruncle: Secondary | ICD-10-CM

## 2022-02-10 NOTE — Progress Notes (Signed)
02/10/2022 12:58 PM   Lisa [name] 1928/12/10 [id number]  Referring provider: Marinda Elk, MD [facility] [place],  [place] [address]  Chief Complaint  Patient presents with   New Patient (Initial Visit)    HPI: 88 year old female with a personal history of pelvic organ prolapse managed with pessary who presents today for further evaluation of urethral caruncle.  She reports that patient had a vaginal exam her pessary was cleaned.  She been having some vaginal discharge which was evaluated by Dr. Leafy Ro.  At the time of her pelvic exam, she is noted to have a either urethral prolapse or urethral carbuncle and presents today for further evaluation of this.  She is currently using estrogen cream per vagina for the past year twice a week.  She has prolapsed hemorrhoids which are bothersome to her.  She has no bother from her urethral growth, no dysuria, UTIs, or bladder related issues.  She is happy with her voiding symptoms.   PMH: Past Medical History:  Diagnosis Date   A-fib ([place])    Allergic rhinitis    with chronic nasal congestion   Anemia, unspecified    Basal cell carcinoma    Change in bowel habit 09/30/2016   Chicken pox    Constipation    Cystocele    Enthesopathy of hip region    Fatigue 09/30/2016   Fibrocystic breast disease    Hyperlipemia    Hypertension    Incomplete bladder emptying    Irregular heart beat    and shortness of breath with SVT, PAT, and ventricular ectopy    Menopausal state    PAC (premature atrial contraction)    Palpitations    Peripheral vascular disease (HCC)    PVC (premature ventricular contraction)    Sinoatrial node dysfunction (HCC)    Urethral caruncle    Urinary urgency    Varicella zoster 04/2007   Weight loss, unintentional 09/30/2016    Surgical History: Past Surgical History:  Procedure Laterality Date   ABLATION     APPENDECTOMY     BASAL CELL CARCINOMA EXCISION      BREAST CYST ASPIRATION Left    CARDIAC SURGERY     CARDIOVERSION     CARDIOVERSION N/A 08/01/2018   Procedure: CARDIOVERSION;  Surgeon: Corey Skains, MD;  Location: [facility];  Service: Cardiovascular;  Laterality: N/A;   CATARACT EXTRACTION Bilateral 2015   CHOLECYSTECTOMY     ELECTROPHYSIOLOGIC STUDY N/A 09/02/2015   Procedure: Cardioversion;  Surgeon: Corey Skains, MD;  Location: [facility];  Service: Cardiovascular;  Laterality: N/A;   ELECTROPHYSIOLOGIC STUDY N/A 10/08/2015   Procedure: CARDIOVERSION;  Surgeon: Corey Skains, MD;  Location: [facility];  Service: Cardiovascular;  Laterality: N/A;   HIP SURGERY     INTRAMEDULLARY (IM) NAIL INTERTROCHANTERIC Right 06/21/2017   Procedure: INTRAMEDULLARY (IM) NAIL INTERTROCHANTRIC;  Surgeon: Dereck Leep, MD;  Location: [facility];  Service: Orthopedics;  Laterality: Right;   KYPHOPLASTY N/A 11/08/2016   Procedure: [id number];  Surgeon: Hessie Knows, MD;  Location: [facility];  Service: Orthopedics;  Laterality: N/A;   TOTAL ABDOMINAL HYSTERECTOMY W/ BILATERAL SALPINGOOPHORECTOMY     WISDOM TOOTH EXTRACTION     WRIST FRACTURE SURGERY Right     Home Medications:  Allergies as of 02/10/2022       Reactions   Ace Inhibitors Cough   Benadryl [diphenhydramine]    Urinary retention    Trazodone    Other reaction(s): Other (See  Comments) Unable to urinate        Medication List        Accurate as of February 10, 2022 12:58 PM. If you have any questions, ask your nurse or doctor.          STOP taking these medications    alendronate 70 MG tablet Commonly known as: FOSAMAX   bismuth subsalicylate 035 KK/93GH suspension Commonly known as: PEPTO BISMOL   cyanocobalamin 1000 MCG tablet Commonly known as: VITAMIN B12   ferrous sulfate 325 (65 FE) MG tablet   Melatonin 5 MG Caps   rosuvastatin 5 MG tablet Commonly known as: CRESTOR   Turmeric 500 MG Caps       TAKE these medications    acetaminophen 500  MG tablet Commonly known as: TYLENOL Take 1,000 mg by mouth daily as needed for moderate pain.   cholecalciferol 25 MCG (1000 UNIT) tablet Commonly known as: VITAMIN D3 Take 2,000 Units by mouth daily.   Eliquis 2.5 MG Tabs tablet Generic drug: apixaban Take 2.5 mg by mouth 2 (two) times daily.   estrogens (conjugated) 1.25 MG tablet Commonly known as: PREMARIN Take 1.25 mg by mouth as directed. Twice a week   metoprolol tartrate 50 MG tablet Commonly known as: LOPRESSOR Take 50 mg by mouth 2 (two) times daily.   multivitamin with minerals Tabs tablet Take 1 tablet by mouth daily.   polyethylene glycol 17 g packet Commonly known as: MIRALAX / GLYCOLAX Take 17 g by mouth daily as needed for mild constipation.   potassium chloride 10 MEQ tablet Commonly known as: KLOR-CON Take 10 mEq by mouth daily.   psyllium 58.6 % packet Commonly known as: METAMUCIL Take 1 packet by mouth daily.   torsemide 10 MG tablet Commonly known as: DEMADEX Take 10 mg by mouth daily.        Allergies:  Allergies  Allergen Reactions   Ace Inhibitors Cough   Benadryl [Diphenhydramine]     Urinary retention    Trazodone     Other reaction(s): Other (See Comments) Unable to urinate    Family History: Family History  Problem Relation Age of Onset   Heart failure Father    Coronary artery disease Father    Heart disease Father    Heart attack Father    Breast cancer Sister 95   Ovarian cancer Sister    Lung cancer Sister    Uterine cancer Sister    Heart failure Brother    Anesthesia problems Mother        with hip surgery age 82's / mental status change   Hypertension Mother    Colon cancer Neg Hx     Social History:  reports that she has never smoked. She has never used smokeless tobacco. She reports that she does not drink alcohol and does not use drugs.   Physical Exam: Constitutional:  Alert and oriented, No acute distress. HEENT: [place] AT, moist mucus membranes.   Trachea midline, no masses. Cardiovascular: No clubbing, cyanosis, or edema. Respiratory: Normal respiratory effort, no increased work of breathing. GU: Pelvic exam chaperoned by Jackquline Bosch, CMA. Neurologic: Grossly intact, no focal deficits, moving all 4 extremities. Psychiatric: Normal mood and affect.  Laboratory Data: Lab Results  Component Value Date   WBC 10.3 07/03/2017   HGB 9.9 (L) 07/03/2017   HCT 31.6 (L) 07/03/2017   MCV 97.1 07/03/2017   PLT 318 07/03/2017    Lab Results  Component Value Date   CREATININE 0.67  07/03/2017    Urinalysis    Component Value Date/Time   COLORURINE YELLOW (A) 06/19/2017 1456   APPEARANCEUR CLEAR (A) 06/19/2017 1456   LABSPEC 1.012 06/19/2017 1456   PHURINE 6.0 06/19/2017 1456   GLUCOSEU NEGATIVE 06/19/2017 1456   HGBUR NEGATIVE 06/19/2017 1456   BILIRUBINUR NEGATIVE 06/19/2017 1456   KETONESUR NEGATIVE 06/19/2017 1456   PROTEINUR NEGATIVE 06/19/2017 1456   NITRITE NEGATIVE 06/19/2017 1456   LEUKOCYTESUR NEGATIVE 06/19/2017 1456    Lab Results  Component Value Date   BACTERIA NONE SEEN 06/19/2017     Assessment & Plan:    1. Urethral caruncle Beefy tongue like urethral caruncle, possibly thrombosed.  She is minimally symptomatic.  Differential diagnosis includes urethral carcinoma, prolapse, or even TCC of the bladder, each less likely.   She understands this but given her age, is has not want to be aggressive with treatment, diagnostic work-up and intervention which is understandable.  I recommended treating the urethra aggressively with topical estrogen cream, daily for 2 weeks and then 3 times a week for the next 4 to 6 weeks.  We will plan to reexamine her in about 6 weeks, if the area persists, we will also plan for cystoscopy to rule out other underlying issues.  Return in about 6 weeks (around 03/24/2022) for pelvic, possible cysto (book as cysto).  Hollice Espy, MD  [facility] [address], [place], [place] [address] [phone]

## 2022-02-10 NOTE — Patient Instructions (Addendum)
A urethral caruncle is a benign outgrowth at the urethral meatus (urethral opening). They occur most commonly in postmenopausal women. Urethral caruncles occur when the outermost part of the urethra everts or turns out. When the mucosa is circumferentially everted, meaning the growth encompasses the entire diameter of the urethra, rather than just a segment of the urethra, the lesion is a urethral prolapse.  Urethral caruncles are often asymptomatic and found on pelvic exam.  Symptoms may include pain, light bleeding, painful urination, or impeded flow of urine. Women may also notice a bump at the urethral meatus.  In asymptomatic patients, treatment is often not necessary. For symptomatic patients, treatment includes vaginal estrogen cream, anti-inflammatory medications such as Motrin, and warm sitz baths.  Surgical excision is recommended for larger, symptomatic caruncles that do not respond to more conservative treatment.   Recommend:  Use estrogen cream, pea sized amount per urethra (just above vaginal opening where there is a flap of tissue) daily x 2 weeks then three times per week

## 2022-03-29 ENCOUNTER — Other Ambulatory Visit: Payer: Medicare PPO | Admitting: Urology

## 2022-05-03 ENCOUNTER — Ambulatory Visit: Payer: Medicare PPO | Admitting: Urology

## 2022-05-03 VITALS — BP 136/85 | HR 85 | Ht 62.0 in | Wt 137.0 lb

## 2022-05-03 DIAGNOSIS — N362 Urethral caruncle: Secondary | ICD-10-CM

## 2022-05-03 LAB — MICROSCOPIC EXAMINATION: WBC, UA: >30 /hpf — AB (ref 0–5)

## 2022-05-03 LAB — URINALYSIS, COMPLETE
Bilirubin, UA: NEGATIVE
Glucose, UA: NEGATIVE
Ketones, UA: NEGATIVE
Nitrite, UA: NEGATIVE
Specific Gravity, UA: 1.015 (ref 1.005–1.030)
Urobilinogen, Ur: 0.2 mg/dL (ref 0.2–1.0)
pH, UA: 6 (ref 5.0–7.5)

## 2022-05-03 NOTE — Progress Notes (Signed)
MRN : [id number]  Lisa [name] is a 89 y.o. (06-17-1928) female who presents with chief complaint of legs swell.  History of Present Illness:   The patient returns to the office for followup evaluation regarding leg swelling.  The swelling has persisted and the pain associated with swelling continues. There have not been any interval development of a ulcerations or wounds.  Since the previous visit the patient has been wearing graduated compression stockings and has noted little if any improvement in the lymphedema. The patient has been using compression routinely morning until night.  The patient also states elevation during the day and exercise is being done too.  No outpatient medications have been marked as taking for the 05/05/22 encounter (Appointment) with Delana Meyer, Dolores Lory, MD.    Past Medical History:  Diagnosis Date   A-fib [facility])    Allergic rhinitis    with chronic nasal congestion   Anemia, unspecified    Basal cell carcinoma    Change in bowel habit 09/30/2016   Chicken pox    Constipation    Cystocele    Enthesopathy of hip region    Fatigue 09/30/2016   Fibrocystic breast disease    Hyperlipemia    Hypertension    Incomplete bladder emptying    Irregular heart beat    and shortness of breath with SVT, PAT, and ventricular ectopy    Menopausal state    PAC (premature atrial contraction)    Palpitations    Peripheral vascular disease (HCC)    PVC (premature ventricular contraction)    Sinoatrial node dysfunction (HCC)    Urethral caruncle    Urinary urgency    Varicella zoster 04/2007   Weight loss, unintentional 09/30/2016    Past Surgical History:  Procedure Laterality Date   ABLATION     APPENDECTOMY     BASAL CELL CARCINOMA EXCISION     BREAST CYST ASPIRATION Left    CARDIAC SURGERY     CARDIOVERSION     CARDIOVERSION N/A 08/01/2018   Procedure: CARDIOVERSION;  Surgeon: Corey Skains, MD;  Location: [facility];  Service:  Cardiovascular;  Laterality: N/A;   CATARACT EXTRACTION Bilateral 2015   CHOLECYSTECTOMY     ELECTROPHYSIOLOGIC STUDY N/A 09/02/2015   Procedure: Cardioversion;  Surgeon: Corey Skains, MD;  Location: [facility];  Service: Cardiovascular;  Laterality: N/A;   ELECTROPHYSIOLOGIC STUDY N/A 10/08/2015   Procedure: CARDIOVERSION;  Surgeon: Corey Skains, MD;  Location: [facility];  Service: Cardiovascular;  Laterality: N/A;   HIP SURGERY     INTRAMEDULLARY (IM) NAIL INTERTROCHANTERIC Right 06/21/2017   Procedure: INTRAMEDULLARY (IM) NAIL INTERTROCHANTRIC;  Surgeon: Dereck Leep, MD;  Location: [facility];  Service: Orthopedics;  Laterality: Right;   KYPHOPLASTY N/A 11/08/2016   Procedure: [id number];  Surgeon: Hessie Knows, MD;  Location: [facility];  Service: Orthopedics;  Laterality: N/A;   TOTAL ABDOMINAL HYSTERECTOMY W/ BILATERAL SALPINGOOPHORECTOMY     WISDOM TOOTH EXTRACTION     WRIST FRACTURE SURGERY Right     Social History Social History   Tobacco Use   Smoking status: Never   Smokeless tobacco: Never  Vaping Use   Vaping Use: Never used  Substance Use Topics   Alcohol use: No   Drug use: No    Family History Family History  Problem Relation Age of Onset   Heart failure Father    Coronary artery disease Father    Heart disease Father    Heart attack Father  Breast cancer Sister 26   Ovarian cancer Sister    Lung cancer Sister    Uterine cancer Sister    Heart failure Brother    Anesthesia problems Mother        with hip surgery age 22's / mental status change   Hypertension Mother    Colon cancer Neg Hx     Allergies  Allergen Reactions   Ace Inhibitors Cough   Benadryl [Diphenhydramine]     Urinary retention    Trazodone     Other reaction(s): Other (See Comments) Unable to urinate     REVIEW OF SYSTEMS (Negative unless checked)  Constitutional: '[]'$ Weight loss  '[]'$ Fever  '[]'$ Chills Cardiac: '[]'$ Chest pain   '[]'$ Chest pressure   '[]'$ Palpitations    '[]'$ Shortness of breath when laying flat   '[]'$ Shortness of breath with exertion. Vascular:  '[]'$ Pain in legs with walking   '[x]'$ Pain in legs with standing  '[]'$ History of DVT   '[]'$ Phlebitis   '[x]'$ Swelling in legs   '[]'$ Varicose veins   '[]'$ Non-healing ulcers Pulmonary:   '[]'$ Uses home oxygen   '[]'$ Productive cough   '[]'$ Hemoptysis   '[]'$ Wheeze  '[]'$ COPD   '[]'$ Asthma Neurologic:  '[]'$ Dizziness   '[]'$ Seizures   '[]'$ History of stroke   '[]'$ History of TIA  '[]'$ Aphasia   '[]'$ Vissual changes   '[]'$ Weakness or numbness in arm   '[]'$ Weakness or numbness in leg Musculoskeletal:   '[]'$ Joint swelling   '[]'$ Joint pain   '[]'$ Low back pain Hematologic:  '[]'$ Easy bruising  '[]'$ Easy bleeding   '[]'$ Hypercoagulable state   '[]'$ Anemic Gastrointestinal:  '[]'$ Diarrhea   '[]'$ Vomiting  '[]'$ Gastroesophageal reflux/heartburn   '[]'$ Difficulty swallowing. Genitourinary:  '[]'$ Chronic kidney disease   '[]'$ Difficult urination  '[]'$ Frequent urination   '[]'$ Blood in urine Skin:  '[]'$ Rashes   '[]'$ Ulcers  Psychological:  '[]'$ History of anxiety   '[]'$  History of major depression.  Physical Examination  There were no vitals filed for this visit. There is no height or weight on file to calculate BMI. Gen: WD/WN, NAD Head: [place]/AT, No temporalis wasting.  Ear/Nose/Throat: Hearing grossly intact, nares w/o erythema or drainage, pinna without lesions Eyes: PER, EOMI, sclera nonicteric.  Neck: Supple, no gross masses.  No JVD.  Pulmonary:  Good air movement, no audible wheezing, no use of accessory muscles.  Cardiac: RRR, precordium not hyperdynamic. Vascular:  scattered varicosities present bilaterally.  Mild venous stasis changes to the legs bilaterally.  3-4+ soft pitting edema, CEAP C4sEpAsPr  Vessel Right Left  Radial Palpable Palpable  Gastrointestinal: soft, non-distended. No guarding/no peritoneal signs.  Musculoskeletal: M/S 5/5 throughout.  No deformity.  Neurologic: CN 2-12 intact. Pain and light touch intact in extremities.  Symmetrical.  Speech is fluent. Motor exam as listed above. Psychiatric:  Judgment intact, Mood & affect appropriate for pt's clinical situation. Dermatologic: Venous rashes no ulcers noted.  No changes consistent with cellulitis. Lymph : No lichenification or skin changes of chronic lymphedema.  CBC Lab Results  Component Value Date   WBC 10.3 07/03/2017   HGB 9.9 (L) 07/03/2017   HCT 31.6 (L) 07/03/2017   MCV 97.1 07/03/2017   PLT 318 07/03/2017    BMET    Component Value Date/Time   NA 139 07/03/2017 1740   K 3.6 07/03/2017 1740   CL 105 07/03/2017 1740   CO2 29 07/03/2017 1740   GLUCOSE 101 (H) 07/03/2017 1740   BUN 26 (H) 07/03/2017 1740   CREATININE 0.67 07/03/2017 1740   CALCIUM 9.1 07/03/2017 1740   GFRNONAA >60 07/03/2017 1740   GFRAA >60 07/03/2017 1740  CrCl cannot be calculated (Patient's most recent lab result is older than the maximum 21 days allowed.).  COAG Lab Results  Component Value Date   INR 1.09 06/19/2017    Radiology No results found.   Assessment/Plan 1. Lymphedema Recommend:  No surgery or intervention at this point in time.   The Patient is CEAP C4sEpAsPr.  The patient has been wearing compression for more than 12 weeks with no or little benefit.  The patient has been exercising daily for more than 12 weeks. The patient has been elevating and taking OTC pain medications for more than 12 weeks.  None of these have have eliminated the pain related to the lymphedema or the discomfort regarding excessive swelling and venous congestion.    I have reviewed my discussion with the patient regarding lymphedema and why it  causes symptoms.  Patient will continue wearing graduated compression on a daily basis. The patient should put the compression on first thing in the morning and removing them in the evening. The patient should not sleep in the compression.   In addition, behavioral modification throughout the day will be continued.  This will include frequent elevation (such as in a recliner), use of over the counter  pain medications as needed and exercise such as walking.  The systemic causes for chronic edema such as liver, kidney and cardiac etiologies do not appear to have significant changed over the past year.    The patient has chronic , severe lymphedema with hyperpigmentation of the skin and has done MLD, skin care, medication, diet, exercise, elevation and compression for 4 weeks with no improvement,  I am recommending a lymphedema pump.  The patient still has stage 3 lymphedema and therefore, I believe that a lymph pump is needed to improve the control of the patient's lymphedema and improve the quality of life.  Additionally, a lymph pump is warranted because it will reduce the risk of cellulitis and ulceration in the future.  Patient should follow-up in six months    2. Benign essential HTN Continue antihypertensive medications as already ordered, these medications have been reviewed and there are no changes at this time.    Hortencia Pilar, MD  05/03/2022 9:03 PM

## 2022-05-03 NOTE — Progress Notes (Signed)
   05/03/22  CC:  Chief Complaint  Patient presents with   urethral caruncle    Follow up    HPI: 89 year old female who presents today for pelvic exam and possible cystoscopy.  Notably she was seen approximately a month ago with a beefy urethral caruncle.  She has been using topical estrogen cream in the interim.  She feels like her discharge has stopped and she thinks it may have resolved.  Blood pressure 136/85, pulse 85, height '5\' 2"'$  (1.575 m), weight 137 lb (62.1 kg). NED. A&Ox3.   No respiratory distress   Abd soft, NT, ND Pelvic exam reveals persistent large multilobulated beefy red mass at the 6 o'clock position of the urethra, unchanged from previous   Cystoscopy Procedure Note  Patient identification was confirmed, informed consent was obtained, and patient was prepped using Betadine solution.  Lidocaine jelly was administered per urethral meatus.     Pre-Procedure: - Inspection reveals a normal caliber ureteral meatus.  Procedure: The flexible cystoscope was introduced without difficulty - No urethral strictures/lesions are present. - Bilateral ureteral orifices identified - Bladder mucosa  reveals no ulcers, tumors, or lesions; there is mass effect posteriorly into the bladder - No bladder stones - No trabeculation  Retroflexion shows unremarkable  Upon withdrawal the scope, did further evaluate the mass.  It appears to be arising just at the os of the meatus without an intravesical or urethral component.  I did also perform a vaginoscopy which showed a blind ending vagina without masses tumors or lesions.   Post-Procedure: - Patient tolerated the procedure well  Assessment/ Plan:  1.  Urethral caruncle- Unchanged lesion despite topical estrogen cream but appears most consistent with benign lesion  Cystoscopy is unremarkable, no evidence of urethral or bladder lesion which is reassuring  Plan to continue topical estrogen cream, we will plan to reassess  the lesion again on pelvic exam in 3 months.  If the lesion is not involuting, may consider biopsy although she very much prefers to avoid this if at all possible given that it is relatively asymptomatic at this point in her comorbidities/age.  Pelvic in 3 mo  Hollice Espy, MD

## 2022-05-05 ENCOUNTER — Ambulatory Visit (INDEPENDENT_AMBULATORY_CARE_PROVIDER_SITE_OTHER): Payer: Medicare PPO | Admitting: Nurse Practitioner

## 2022-05-05 ENCOUNTER — Encounter (INDEPENDENT_AMBULATORY_CARE_PROVIDER_SITE_OTHER): Payer: Self-pay | Admitting: Nurse Practitioner

## 2022-05-05 VITALS — BP 143/81 | HR 80 | Ht 62.0 in | Wt 137.0 lb

## 2022-05-05 DIAGNOSIS — I10 Essential (primary) hypertension: Secondary | ICD-10-CM | POA: Diagnosis not present

## 2022-05-05 DIAGNOSIS — I6523 Occlusion and stenosis of bilateral carotid arteries: Secondary | ICD-10-CM

## 2022-05-05 DIAGNOSIS — I890 Lymphedema, not elsewhere classified: Secondary | ICD-10-CM | POA: Diagnosis not present

## 2022-05-05 DIAGNOSIS — I872 Venous insufficiency (chronic) (peripheral): Secondary | ICD-10-CM

## 2022-05-05 DIAGNOSIS — I480 Paroxysmal atrial fibrillation: Secondary | ICD-10-CM

## 2022-05-22 ENCOUNTER — Encounter (INDEPENDENT_AMBULATORY_CARE_PROVIDER_SITE_OTHER): Payer: Self-pay | Admitting: Nurse Practitioner

## 2022-08-02 ENCOUNTER — Ambulatory Visit: Payer: Medicare PPO | Admitting: Urology

## 2022-08-02 VITALS — BP 138/84 | HR 80 | Ht 62.0 in | Wt 150.0 lb

## 2022-08-02 DIAGNOSIS — N362 Urethral caruncle: Secondary | ICD-10-CM

## 2022-08-02 NOTE — Progress Notes (Signed)
I, Amy L Pierron, acting as a scribe for Lisa Scotland, MD.,have documented all relevant documentation on the behalf of Lisa Scotland, MD,as directed by  Lisa Scotland, MD while in the presence of Lisa Scotland, MD.  08/02/2022 2:31 PM   Lisa [name] 1928/12/06 [id number]  Referring provider: [name], MD [address] [facility] [address] [place]  [place] [address]  Chief Complaint  Patient presents with   Follow-up    Pelvic exam    HPI: 89 year-old female returns today for a repeat pelvic exam.  She was found to have a large multilobulated beefy red mass at the six o'clock position and her meatus consistent with probable urethral caruncle. She's been using topical estrogen cream consistently.  She reports improvement with her urinary symptoms. The caruncle is not bothering her. She is currently wearing a pessary.    PMH: Past Medical History:  Diagnosis Date   A-fib (HCC)    Allergic rhinitis    with chronic nasal congestion   Anemia, unspecified    Basal cell carcinoma    Change in bowel habit 09/30/2016   Chicken pox    Constipation    Cystocele    Enthesopathy of hip region    Fatigue 09/30/2016   Fibrocystic breast disease    Hyperlipemia    Hypertension    Incomplete bladder emptying    Irregular heart beat    and shortness of breath with SVT, PAT, and ventricular ectopy    Menopausal state    PAC (premature atrial contraction)    Palpitations    Peripheral vascular disease (HCC)    PVC (premature ventricular contraction)    Sinoatrial node dysfunction (HCC)    Urethral caruncle    Urinary urgency    Varicella zoster 04/2007   Weight loss, unintentional 09/30/2016    Surgical History: Past Surgical History:  Procedure Laterality Date   ABLATION     APPENDECTOMY     BASAL CELL CARCINOMA EXCISION     BREAST CYST ASPIRATION Left    CARDIAC SURGERY     CARDIOVERSION     CARDIOVERSION N/A 08/01/2018   Procedure:  CARDIOVERSION;  Surgeon: Lamar Blinks, MD;  Location: [facility] ORS;  Service: Cardiovascular;  Laterality: N/A;   CATARACT EXTRACTION Bilateral 2015   CHOLECYSTECTOMY     ELECTROPHYSIOLOGIC STUDY N/A 09/02/2015   Procedure: Cardioversion;  Surgeon: Lamar Blinks, MD;  Location: [facility];  Service: Cardiovascular;  Laterality: N/A;   ELECTROPHYSIOLOGIC STUDY N/A 10/08/2015   Procedure: CARDIOVERSION;  Surgeon: Lamar Blinks, MD;  Location: [facility];  Service: Cardiovascular;  Laterality: N/A;   HIP SURGERY     INTRAMEDULLARY (IM) NAIL INTERTROCHANTERIC Right 06/21/2017   Procedure: INTRAMEDULLARY (IM) NAIL INTERTROCHANTRIC;  Surgeon: Donato Heinz, MD;  Location: [facility] OR[facility];  Service: Orthopedics;  Laterality: Right;   KYPHOPLASTY N/A 11/08/2016   Procedure: [id number];  Surgeon: Kennedy Bucker, MD;  Location: [facility] OR[facility];  Service: Orthopedics;  Laterality: N/A;   TOTAL ABDOMINAL HYSTERECTOMY W/ BILATERAL SALPINGOOPHORECTOMY     WISDOM TOOTH EXTRACTION     WRIST FRACTURE SURGERY Right     Home Medications:  Allergies as of 08/02/2022       Reactions   Ace Inhibitors Cough   Benadryl [diphenhydramine]    Urinary retention    Trazodone    Other reaction(s): Other (See Comments) Unable to urinate        Medication List        Accurate as of  Aug 02, 2022  2:31 PM. If you have any questions, ask your nurse or doctor.          acetaminophen 500 MG tablet Commonly known as: TYLENOL Take 1,000 mg by mouth daily as needed for moderate pain.   cholecalciferol 25 MCG (1000 UNIT) tablet Commonly known as: VITAMIN D3 Take 2,000 Units by mouth daily.   Eliquis 2.5 MG Tabs tablet Generic drug: apixaban Take 2.5 mg by mouth 2 (two) times daily.   estradiol 0.1 MG/GM vaginal cream Commonly known as: ESTRACE Place 1 Applicatorful vaginally 3 (three) times a week.   metoprolol tartrate 50 MG tablet Commonly known as: LOPRESSOR Take 50 mg by mouth 2 (two) times daily.    multivitamin with minerals Tabs tablet Take 1 tablet by mouth daily.   polyethylene glycol 17 g packet Commonly known as: MIRALAX / GLYCOLAX Take 17 g by mouth daily as needed for mild constipation.   potassium chloride 10 MEQ tablet Commonly known as: KLOR-CON Take 10 mEq by mouth daily.   psyllium 58.6 % packet Commonly known as: METAMUCIL Take 1 packet by mouth daily.   torsemide 10 MG tablet Commonly known as: DEMADEX Take 10 mg by mouth daily.        Allergies:  Allergies  Allergen Reactions   Ace Inhibitors Cough   Benadryl [Diphenhydramine]     Urinary retention    Trazodone     Other reaction(s): Other (See Comments) Unable to urinate    Family History: Family History  Problem Relation Age of Onset   Heart failure Father    Coronary artery disease Father    Heart disease Father    Heart attack Father    Breast cancer Sister 7   Ovarian cancer Sister    Lung cancer Sister    Uterine cancer Sister    Heart failure Brother    Anesthesia problems Mother        with hip surgery age 25's / mental status change   Hypertension Mother    Colon cancer Neg Hx     Social History:  reports that she has never smoked. She has never used smokeless tobacco. She reports that she does not drink alcohol and does not use drugs.   Physical Exam: BP 138/84   Pulse 80   Ht 5\' 2"  (1.575 m)   Wt 150 lb (68 kg)   BMI 27.44 kg/m   Constitutional:  Alert and oriented, No acute distress. HEENT: [place] AT, moist mucus membranes.  Trachea midline, no masses. GU: Approximately 5 mm pedunculated area, the 6:00 position of her urethra. Neurologic: Grossly intact, no focal deficits, moving all 4 extremities. Psychiatric: Normal mood and affect.  Assessment & Plan:    Urethral caruncle  - Physical exam chaperoned by CMA Hopkins.  -Urethral lesion slightly smaller than previously appreciated perhaps c/w slowly involuting benign lesion  - She remains asymptomatic.  -very low  suspicion for malignancy   - Continuing estrogen cream and follow-up as needed if she has any other symptoms.   Return if symptoms worsen or fail to improve.  I have reviewed the above documentation for accuracy and completeness, and I agree with the above.   Lisa Scotland, MD     [facility] [address], [address] [place], [place] [address] [phone]

## 2022-08-07 ENCOUNTER — Encounter: Payer: Self-pay | Admitting: Internal Medicine

## 2022-08-07 ENCOUNTER — Inpatient Hospital Stay
Admission: EM | Admit: 2022-08-07 | Discharge: 2022-08-09 | DRG: 812 | Disposition: A | Discharge status: Home Health | Payer: Medicare PPO | Attending: Internal Medicine | Admitting: Internal Medicine

## 2022-08-07 ENCOUNTER — Other Ambulatory Visit: Payer: Self-pay

## 2022-08-07 DIAGNOSIS — I340 Nonrheumatic mitral (valve) insufficiency: Secondary | ICD-10-CM | POA: Diagnosis present

## 2022-08-07 DIAGNOSIS — I5032 Chronic diastolic (congestive) heart failure: Secondary | ICD-10-CM | POA: Diagnosis present

## 2022-08-07 DIAGNOSIS — D62 Acute posthemorrhagic anemia: Secondary | ICD-10-CM | POA: Diagnosis not present

## 2022-08-07 DIAGNOSIS — I872 Venous insufficiency (chronic) (peripheral): Secondary | ICD-10-CM | POA: Diagnosis present

## 2022-08-07 DIAGNOSIS — I890 Lymphedema, not elsewhere classified: Secondary | ICD-10-CM | POA: Diagnosis present

## 2022-08-07 DIAGNOSIS — E785 Hyperlipidemia, unspecified: Secondary | ICD-10-CM | POA: Diagnosis present

## 2022-08-07 DIAGNOSIS — I480 Paroxysmal atrial fibrillation: Secondary | ICD-10-CM | POA: Diagnosis present

## 2022-08-07 DIAGNOSIS — D649 Anemia, unspecified: Secondary | ICD-10-CM | POA: Diagnosis not present

## 2022-08-07 DIAGNOSIS — Z885 Allergy status to narcotic agent status: Secondary | ICD-10-CM

## 2022-08-07 DIAGNOSIS — Z801 Family history of malignant neoplasm of trachea, bronchus and lung: Secondary | ICD-10-CM

## 2022-08-07 DIAGNOSIS — Z66 Do not resuscitate: Secondary | ICD-10-CM | POA: Diagnosis present

## 2022-08-07 DIAGNOSIS — I110 Hypertensive heart disease with heart failure: Secondary | ICD-10-CM | POA: Diagnosis present

## 2022-08-07 DIAGNOSIS — Z7901 Long term (current) use of anticoagulants: Secondary | ICD-10-CM

## 2022-08-07 DIAGNOSIS — I051 Rheumatic mitral insufficiency: Secondary | ICD-10-CM | POA: Diagnosis present

## 2022-08-07 DIAGNOSIS — Z8041 Family history of malignant neoplasm of ovary: Secondary | ICD-10-CM

## 2022-08-07 DIAGNOSIS — Z8049 Family history of malignant neoplasm of other genital organs: Secondary | ICD-10-CM

## 2022-08-07 DIAGNOSIS — Z85828 Personal history of other malignant neoplasm of skin: Secondary | ICD-10-CM

## 2022-08-07 DIAGNOSIS — Z9049 Acquired absence of other specified parts of digestive tract: Secondary | ICD-10-CM

## 2022-08-07 DIAGNOSIS — I739 Peripheral vascular disease, unspecified: Secondary | ICD-10-CM | POA: Diagnosis present

## 2022-08-07 DIAGNOSIS — I959 Hypotension, unspecified: Secondary | ICD-10-CM | POA: Diagnosis present

## 2022-08-07 DIAGNOSIS — Z803 Family history of malignant neoplasm of breast: Secondary | ICD-10-CM

## 2022-08-07 DIAGNOSIS — K922 Gastrointestinal hemorrhage, unspecified: Secondary | ICD-10-CM

## 2022-08-07 DIAGNOSIS — Z8249 Family history of ischemic heart disease and other diseases of the circulatory system: Secondary | ICD-10-CM

## 2022-08-07 DIAGNOSIS — Z79899 Other long term (current) drug therapy: Secondary | ICD-10-CM

## 2022-08-07 DIAGNOSIS — Z888 Allergy status to other drugs, medicaments and biological substances status: Secondary | ICD-10-CM

## 2022-08-07 DIAGNOSIS — I10 Essential (primary) hypertension: Secondary | ICD-10-CM | POA: Diagnosis not present

## 2022-08-07 LAB — BASIC METABOLIC PANEL
Anion gap: 7 (ref 5–15)
BUN: 24 mg/dL — ABNORMAL HIGH (ref 8–23)
CO2: 28 mmol/L (ref 22–32)
Calcium: 8.7 mg/dL — ABNORMAL LOW (ref 8.9–10.3)
Chloride: 100 mmol/L (ref 98–111)
Creatinine, Ser: 0.93 mg/dL (ref 0.44–1.00)
GFR, Estimated: 58 mL/min — ABNORMAL LOW (ref >60)
Glucose, Bld: 147 mg/dL — ABNORMAL HIGH (ref 70–99)
Potassium: 3.5 mmol/L (ref 3.5–5.1)
Sodium: 135 mmol/L (ref 135–145)

## 2022-08-07 LAB — CBC
HCT: 23.8 % — ABNORMAL LOW (ref 36.0–46.0)
Hemoglobin: 7.6 g/dL — ABNORMAL LOW (ref 12.0–15.0)
MCH: 32.6 pg (ref 26.0–34.0)
MCHC: 31.9 g/dL (ref 30.0–36.0)
MCV: 102.1 fL — ABNORMAL HIGH (ref 80.0–100.0)
Platelets: 133 10*3/uL — ABNORMAL LOW (ref 150–400)
RBC: 2.33 MIL/uL — ABNORMAL LOW (ref 3.87–5.11)
RDW: 16.5 % — ABNORMAL HIGH (ref 11.5–15.5)
WBC: 6.8 10*3/uL (ref 4.0–10.5)
nRBC: 0 % (ref 0.0–0.2)

## 2022-08-07 LAB — HEMOGLOBIN AND HEMATOCRIT, BLOOD
HCT: 24.4 % — ABNORMAL LOW (ref 36.0–46.0)
Hemoglobin: 7.8 g/dL — ABNORMAL LOW (ref 12.0–15.0)

## 2022-08-07 LAB — IRON AND TIBC
Iron: 34 ug/dL (ref 28–170)
Saturation Ratios: 10 % — ABNORMAL LOW (ref 10.4–31.8)
TIBC: 340 ug/dL (ref 250–450)
UIBC: 306 ug/dL

## 2022-08-07 LAB — APTT: aPTT: 36 seconds (ref 24–36)

## 2022-08-07 LAB — TROPONIN I (HIGH SENSITIVITY): Troponin I (High Sensitivity): 17 ng/L (ref <18)

## 2022-08-07 LAB — PROTIME-INR
INR: 2 — ABNORMAL HIGH (ref 0.8–1.2)
Prothrombin Time: 22.7 seconds — ABNORMAL HIGH (ref 11.4–15.2)

## 2022-08-07 LAB — FERRITIN: Ferritin: 43 ng/mL (ref 11–307)

## 2022-08-07 LAB — FOLATE: Folate: 33 ng/mL (ref >5.9)

## 2022-08-07 MED ORDER — ACETAMINOPHEN 325 MG PO TABS: 650.0000 mg | ORAL_TABLET | Freq: Four times a day (QID) | ORAL | Status: DC | PRN

## 2022-08-07 MED ORDER — POLYSACCHARIDE IRON COMPLEX 150 MG PO CAPS
150.0000 mg | ORAL_CAPSULE | Freq: Every day | ORAL | Status: DC
  Administered 2022-08-07 – 2022-08-09 (×3): 150 mg via ORAL
  Filled 2022-08-07 (×3): qty 1

## 2022-08-07 MED ORDER — ACETAMINOPHEN 650 MG RE SUPP: 650.0000 mg | Freq: Four times a day (QID) | RECTAL | Status: DC | PRN

## 2022-08-07 MED ORDER — SODIUM CHLORIDE 0.9% FLUSH
3.0000 mL | Freq: Two times a day (BID) | INTRAVENOUS | Status: DC
  Administered 2022-08-07 – 2022-08-09 (×3): 3 mL via INTRAVENOUS

## 2022-08-07 MED ORDER — ONDANSETRON HCL 4 MG/2ML IJ SOLN: 4.0000 mg | Freq: Four times a day (QID) | INTRAMUSCULAR | Status: DC | PRN

## 2022-08-07 MED ORDER — ONDANSETRON HCL 4 MG PO TABS: 4.0000 mg | ORAL_TABLET | Freq: Four times a day (QID) | ORAL | Status: DC | PRN

## 2022-08-07 MED ORDER — METOPROLOL TARTRATE 50 MG PO TABS
50.0000 mg | ORAL_TABLET | Freq: Two times a day (BID) | ORAL | Status: DC
  Administered 2022-08-08: 50 mg via ORAL
  Filled 2022-08-07: qty 1

## 2022-08-07 MED ORDER — POTASSIUM CHLORIDE ER 10 MEQ PO TBCR
10.0000 meq | EXTENDED_RELEASE_TABLET | Freq: Every day | ORAL | Status: DC
  Administered 2022-08-07 – 2022-08-09 (×3): 10 meq via ORAL
  Filled 2022-08-07 (×6): qty 1

## 2022-08-07 NOTE — ED Provider Notes (Signed)
[facility] Provider Note    Event Date/Time   First MD Initiated Contact with Patient 08/07/22 1435     (approximate)   History   Weakness   HPI  Lisa [name] is a 89 y.o. female past medical history of atrial fibrillation on Eliquis, lymphedema who presents with concern for bleeding from skin on her back.  Patient tells me that for the last 3 days she has had consecutive episodes where she will bleed for about 3 to 4 hours from a spot on her back.  It usually occurs when she is in the bathroom and she will see some trickling of blood from what she describes as a pinprick spot on her upper back.  She adamantly denies any bleeding from her legs or from her GI tract including any melena hematemesis or bright red blood per rectum.  This morning when she got out of bed to use the bathroom she started having bleeding while in the bathroom and it was on the floor.  She started to feel very weak and lightheaded which is why she called the ambulance.  Denies history of similar.  Denies history of needing blood transfusion.     Past Medical History:  Diagnosis Date   A-fib [facility])    Allergic rhinitis    with chronic nasal congestion   Anemia, unspecified    Basal cell carcinoma    Change in bowel habit 09/30/2016   Chicken pox    Constipation    Cystocele    Enthesopathy of hip region    Fatigue 09/30/2016   Fibrocystic breast disease    Hyperlipemia    Hypertension    Incomplete bladder emptying    Irregular heart beat    and shortness of breath with SVT, PAT, and ventricular ectopy    Menopausal state    PAC (premature atrial contraction)    Palpitations    Peripheral vascular disease (HCC)    PVC (premature ventricular contraction)    Sinoatrial node dysfunction (HCC)    Urethral caruncle    Urinary urgency    Varicella zoster 04/2007   Weight loss, unintentional 09/30/2016    Patient Active Problem List   Diagnosis Date Noted   Chronic  venous insufficiency 01/31/2022   Venous ulcer (HCC) 11/30/2021   Lymphedema 10/29/2021   Status post hip surgery 07/05/2017   Malnutrition of moderate degree 06/20/2017   Closed right hip fracture (HCC) 06/19/2017   Bilateral leg edema 05/14/2017   Intractable pain 11/06/2016   Cystocele, midline 04/28/2016   Incomplete bladder emptying 04/28/2016   Cystocele 02/12/2015   Vaginal enterocele 02/12/2015   Bilateral carotid artery stenosis 12/08/2014   Absolute anemia 11/13/2014   Benign essential HTN 11/13/2014   APC (atrial premature contractions) 11/13/2014   Awareness of heartbeats 11/13/2014   PVC (premature ventricular contraction) 11/13/2014   Arrhythmia, sinus node 11/13/2014   Combined fat and carbohydrate induced hyperlipemia 04/23/2014   Severe mitral insufficiency 04/23/2014   AF (paroxysmal atrial fibrillation) (HCC) 04/23/2014   Basal cell carcinoma of face 09/18/2012     Physical Exam  Triage Vital Signs: ED Triage Vitals [08/07/22 1215]  Enc Vitals Group     BP 105/60     Pulse Rate 87     Resp 18     Temp 97.6 F (36.4 C)     Temp Source Oral     SpO2 96 %     Weight      Height  Head Circumference      Peak Flow      Pain Score 0     Pain Loc      Pain Edu?      Excl. in GC?     Most recent vital signs: Vitals:   08/07/22 1215  BP: 105/60  Pulse: 87  Resp: 18  Temp: 97.6 F (36.4 C)  SpO2: 96%     General: Awake, no distress.  CV:  Good peripheral perfusion.  Resp:  Normal effort.  Abd:  No distention.  Neuro:             Awake, Alert, Oriented x 3  Other:  Patient has pitting edema of the bilateral lower extremities no active bleeding Patient has several cherry angiomas and multiple SKs throughout the back but there is no area of obvious recent bleeding or current bleeding No gross blood on rectal exam no melena brown stool  ED Results / Procedures / Treatments  Labs (all labs ordered are listed, but only abnormal results are  displayed) Labs Reviewed  BASIC METABOLIC PANEL - Abnormal; Notable for the following components:      Result Value   Glucose, Bld 147 (*)    BUN 24 (*)    Calcium 8.7 (*)    GFR, Estimated 58 (*)    All other components within normal limits  CBC - Abnormal; Notable for the following components:   RBC 2.33 (*)    Hemoglobin 7.6 (*)    HCT 23.8 (*)    MCV 102.1 (*)    RDW 16.5 (*)    Platelets 133 (*)    All other components within normal limits  HEMOGLOBIN AND HEMATOCRIT, BLOOD - Abnormal; Notable for the following components:   Hemoglobin 7.8 (*)    HCT 24.4 (*)    All other components within normal limits  PROTIME-INR - Abnormal; Notable for the following components:   Prothrombin Time 22.7 (*)    INR 2.0 (*)    All other components within normal limits  APTT  URINALYSIS, ROUTINE W REFLEX MICROSCOPIC  CBG MONITORING, ED  TYPE AND SCREEN  TROPONIN I (HIGH SENSITIVITY)     EKG   EKG reviewed interpreted by myself shows atrial fibrillation with controlled rate diffuse ST depressions new from prior  RADIOLOGY    PROCEDURES:  Critical Care performed: No  Procedures   MEDICATIONS ORDERED IN ED: Medications - No data to display   IMPRESSION / MDM / ASSESSMENT AND PLAN / ED COURSE  I reviewed the triage vital signs and the nursing notes.                              Patient's presentation is most consistent with acute presentation with potential threat to life or bodily function.  Differential diagnosis includes, but is not limited to, bleeding skin lesion, coagulopathy, symptomatic anemia  The patient is a 89 year old female who presents today because of blood loss.  The triage note says she has bleeding from varicose vein and this is what EMS had told triage however patient denies this and tells me that she has had bleeding from a small area on her back for the last 3 days.  They last for several hours at a time.  Most recent episode happened around 3 AM  after patient noted when he gotten up to use the bathroom started noticing bleeding hitting the floor.  Patient's daughter does have  a picture of the bathroom and there is a significant amount of blood on the floor.  She was not able to stop it and thus called the ambulance because she started to feel very weak.  Patient's vital signs are notable for borderline hypotension but otherwise are reassuring.  On exam I am not able to find any obvious source of external bleeding.  She has multiple SKs on the back and some cherry angioma but there is no sequela of recent bleeding or any active bleeding.  I did examine her legs as well and there is no active bleeding does have some dried blood down the left leg.  Patient is quite adamant that the only bleeding has been coming from a spot on her back.  Patient's hemoglobin is notably 7.6.  4 months ago was around 14 to certainly concerning for significant volume of blood loss.  Will repeat hemoglobin now as suspect may continue to downtrend.  Will send type and screen.  Anticipate admission as I do not see any obvious source so it is hard to know if this will start again.  Did obtain EKG given patient's lightheadedness and she has diffuse ST depressions however she is not complaining of any chest pain or dyspnea suspect that this is demand related.  Will check troponin.  Given patient's significant anemia and ischemic EKG will admit for observation.  Patient is agreement with plan.       FINAL CLINICAL IMPRESSION(S) / ED DIAGNOSES   Final diagnoses:  Anemia, unspecified type     Rx / DC Orders   ED Discharge Orders     None        Note:  This document was prepared using Dragon voice recognition software and may include unintentional dictation errors.   Georga Hacking, MD 08/07/22 [phone]

## 2022-08-07 NOTE — Assessment & Plan Note (Signed)
History of severe mitral insufficiency.  Follows with [facility].

## 2022-08-07 NOTE — ED Triage Notes (Signed)
Pt complains of varicose veins bleeding in lower legs, swelling in legs and weakness x 3 days.

## 2022-08-07 NOTE — Assessment & Plan Note (Addendum)
Chronic venous insufficiency with evidence of chronic skin changes of bilateral lower extremities.  - SCDs and continue home compression stockings -Vascular surgery consultation.  She follows with Dr. Gilda Crease in the office

## 2022-08-07 NOTE — Assessment & Plan Note (Signed)
-   Resume home torsemide tomorrow if patient's blood pressure remains stable

## 2022-08-07 NOTE — ED Triage Notes (Addendum)
First RN note-  Pt BIB [facility]. Pt has varicose veins in the legs and have bleeding for 2-3 days. Pt states she thinks she may need a transfusion. EMS reports significant bleeding at home. EMS stated bleeding controlled at this time.   Vitals for EMS 104/54 100% RA HR 100 CBG 138

## 2022-08-07 NOTE — Assessment & Plan Note (Signed)
Patient's blood pressure is on the lower end of normal compared to previous.  At office visits, it is usually at least systolics above 130. -Keep metoprolol at 25 mg twice a day for now

## 2022-08-07 NOTE — Assessment & Plan Note (Addendum)
-   Hold home Eliquis in the setting of acute blood loss anemia - Resume home metoprolol tomorrow if blood pressure remains stable

## 2022-08-07 NOTE — Assessment & Plan Note (Addendum)
Patient is presenting with symptomatic anemia in the setting of acute blood loss anemia of unknown source.  Her previous hemoglobins were in the mid 14, currently 7.6.  I suspect this is likely a small varicose vein that is being irritated or nicked.  Given this most frequently occurs when in motion or sitting, will will ask PT and RN to work with patient to see if we can find the source.  If it is a small varicose vein, could consider consulting vascular surgery for cauterization.  No evidence of ulcer, wound, or rash.  Given projection of blood, not consistent with urinary or bowel bleed.  - Telemetry monitoring - CBC twice daily - Transfuse for hemoglobin less than 7.  Patient stated she will consent to blood transfusion - Iron panel, ferritin, B12 and folate pending

## 2022-08-07 NOTE — H&P (Signed)
History and Physical    Patient: Lisa [name] BJY:[id number] DOB: [birth year] DOA: 08/07/2022 DOS: the patient was seen and examined on 08/07/2022 PCP: [name], MD  Patient coming from: Home  Chief Complaint:  Chief Complaint  Patient presents with   Weakness   HPI: Lisa [name] is a 89 y.o. female with medical history significant of atrial fibrillation on warfarin, HFpEF, severe mitral insufficiency, bilateral carotid artery disease, hypertension, chronic venous insufficiency, lymphedema, who presents to the ED due to weakness and blood loss.  Ms. Lisa [name] states that over the last few weeks, she has had multiple episodes of bleeding, but in the past 3 days, episodes have become more frequent and blood loss amount has increased.  She states that episodes occur most frequently when she is sitting down to go use the restroom but occur also when she is not sitting.  She is uncertain where the blood is coming from but states it is coming out in a needle-head sized stream that is persistent.  She believes it is most likely from her back or or possibly her side.  When she was sitting on the commode earlier today, it was hitting the right side of her wall.  She denies any hematuria, melena or hematochezia.  In the past several weeks, she has been noticing worsening dyspnea on exertion, general weakness, and palpitations.  She denies any chest pain.  ED course: On full to the ED, patient's blood pressure was on the lower end of normal at 105/60 with heart rate of 87.  She is saturating at 96% on room air.  She was afebrile at 97.6. Initial workup notable for hemoglobin of 7.6, MCV of 102, platelets of 133, potassium 3.5, glucose 147, BUN 24, creatinine 0.93 and GFR 58.  Troponin negative at 17.  INR 2.0.  Due to concern for symptomatic acute blood loss anemia, [facility] contacted for admission.  Review of Systems: As mentioned in the history of present illness. All other systems reviewed  and are negative.  Past Medical History:  Diagnosis Date   A-fib [facility])    Allergic rhinitis    with chronic nasal congestion   Anemia, unspecified    Basal cell carcinoma    Change in bowel habit 09/30/2016   Chicken pox    Constipation    Cystocele    Enthesopathy of hip region    Fatigue 09/30/2016   Fibrocystic breast disease    Hyperlipemia    Hypertension    Incomplete bladder emptying    Irregular heart beat    and shortness of breath with SVT, PAT, and ventricular ectopy    Menopausal state    PAC (premature atrial contraction)    Palpitations    Peripheral vascular disease (HCC)    PVC (premature ventricular contraction)    Sinoatrial node dysfunction (HCC)    Urethral caruncle    Urinary urgency    Varicella zoster 04/2007   Weight loss, unintentional 09/30/2016   Past Surgical History:  Procedure Laterality Date   ABLATION     APPENDECTOMY     BASAL CELL CARCINOMA EXCISION     BREAST CYST ASPIRATION Left    CARDIAC SURGERY     CARDIOVERSION     CARDIOVERSION N/A 08/01/2018   Procedure: CARDIOVERSION;  Surgeon: Lamar Blinks, MD;  Location: [facility] ORS;  Service: Cardiovascular;  Laterality: N/A;   CATARACT EXTRACTION Bilateral 2015   CHOLECYSTECTOMY     ELECTROPHYSIOLOGIC STUDY N/A 09/02/2015   Procedure: Cardioversion;  Surgeon: Lamar Blinks, MD;  Location: [facility] ORS;  Service: Cardiovascular;  Laterality: N/A;   ELECTROPHYSIOLOGIC STUDY N/A 10/08/2015   Procedure: CARDIOVERSION;  Surgeon: Lamar Blinks, MD;  Location: [facility] ORS;  Service: Cardiovascular;  Laterality: N/A;   HIP SURGERY     INTRAMEDULLARY (IM) NAIL INTERTROCHANTERIC Right 06/21/2017   Procedure: INTRAMEDULLARY (IM) NAIL INTERTROCHANTRIC;  Surgeon: Donato Heinz, MD;  Location: [facility] ORS;  Service: Orthopedics;  Laterality: Right;   KYPHOPLASTY N/A 11/08/2016   Procedure: [id number];  Surgeon: Kennedy Bucker, MD;  Location: [facility] ORS;  Service: Orthopedics;  Laterality: N/A;   TOTAL  ABDOMINAL HYSTERECTOMY W/ BILATERAL SALPINGOOPHORECTOMY     WISDOM TOOTH EXTRACTION     WRIST FRACTURE SURGERY Right    Social History:  reports that she has never smoked. She has never used smokeless tobacco. She reports that she does not drink alcohol and does not use drugs.  Allergies  Allergen Reactions   Ace Inhibitors Cough   Benadryl [Diphenhydramine]     Urinary retention    Trazodone     Other reaction(s): Other (See Comments) Unable to urinate    Family History  Problem Relation Age of Onset   Heart failure Father    Coronary artery disease Father    Heart disease Father    Heart attack Father    Breast cancer Sister 9   Ovarian cancer Sister    Lung cancer Sister    Uterine cancer Sister    Heart failure Brother    Anesthesia problems Mother        with hip surgery age 54's / mental status change   Hypertension Mother    Colon cancer Neg Hx     Prior to Admission medications   Medication Sig Start Date End Date Taking? Authorizing Provider  acetaminophen (TYLENOL) 500 MG tablet Take 1,000 mg by mouth daily as needed for moderate pain.    [provider]  cholecalciferol (VITAMIN D) 1000 units tablet Take 2,000 Units by mouth daily.     [provider]  ELIQUIS 2.5 MG TABS tablet Take 2.5 mg by mouth 2 (two) times daily.  03/17/17   [provider]  estradiol (ESTRACE) 0.1 MG/GM vaginal cream Place 1 Applicatorful vaginally 3 (three) times a week.    [provider]  metoprolol tartrate (LOPRESSOR) 50 MG tablet Take 50 mg by mouth 2 (two) times daily.    [provider]  Multiple Vitamin (MULTIVITAMIN WITH MINERALS) TABS tablet Take 1 tablet by mouth daily. 06/24/17   Enid Baas, MD  polyethylene glycol (MIRALAX / GLYCOLAX) packet Take 17 g by mouth daily as needed for mild constipation.     [provider]  potassium chloride (KLOR-CON) 10 MEQ tablet Take 10 mEq by mouth daily.    [provider]  psyllium (METAMUCIL) 58.6 % packet Take 1 packet by mouth daily.    [provider]  torsemide (DEMADEX) 10 MG tablet Take 10 mg by mouth daily.    [provider]    Physical Exam: Vitals:   08/07/22 1215  BP: 105/60  Pulse: 87  Resp: 18  Temp: 97.6 F (36.4 C)  TempSrc: Oral  SpO2: 96%   Physical Exam Vitals and nursing note reviewed.  Constitutional:      General: She is not in acute distress.    Appearance: She is normal weight.  HENT:     Head: Normocephalic and atraumatic.     Mouth/Throat:  Mouth: Mucous membranes are moist.     Pharynx: Oropharynx is clear.  Eyes:     Conjunctiva/sclera: Conjunctivae normal.     Pupils: Pupils are equal, round, and reactive to light.  Cardiovascular:     Rate and Rhythm: Normal rate and regular rhythm.     Heart sounds: Murmur (Loud holosystolic murmur heard best at the lower left sternal border and apex) heard.     Comments: 2+ pitting edema extending to the superior thigh consistent with patient's known history lymphedema Pulmonary:     Effort: Pulmonary effort is normal. No respiratory distress.     Breath sounds: Normal breath sounds. No wheezing, rhonchi or rales.  Abdominal:     General: Bowel sounds are normal.     Palpations: Abdomen is soft.  Skin:    General: Skin is warm and dry.     Comments: Patient is back and bilateral lower extremities were examined.  On the back, extensive seborrheic keratoses, but no evidence of recent bleeding.  1 small cherry angioma with no evidence of recent bleeding.  On the superior and lateral aspect of bilateral thighs, there are many superficial varicose veins.  No evidence of recent bleeding.  No external hemorrhoids.  Neurological:     Mental Status: She is alert and oriented to person, place, and time. Mental status is at baseline.  Psychiatric:        Mood and Affect: Mood normal.        Behavior: Behavior normal.    Data Reviewed: CBC with WBC of 6.8,  hemoglobin 7.6, MCV of 102, and platelets of 133 BMP with sodium of 135, potassium 3.5, bicarb 28, glucose 147, BUN 24, creatinine 0.93 with GFR 58 Troponin negative at 17 INR elevated at 2.0 PTT 36  EKG personally reviewed.  Atrial fibrillation with rate of 98.  T wave inversion diffusely and minimal depression that is less than 2 mm.  No ischemic appearing changes.  Results are pending, will review when available.  Assessment and Plan:  * Symptomatic anemia Patient is presenting with symptomatic anemia in the setting of acute blood loss anemia of unknown source.  Her previous hemoglobins were in the mid 14, currently 7.6.  I suspect this is likely a small varicose vein that is being irritated or nicked.  Given this most frequently occurs when in motion or sitting, will will ask PT and RN to work with patient to see if we can find the source.  If it is a small varicose vein, could consider consulting vascular surgery for cauterization.  No evidence of ulcer, wound, or rash.  Given projection of blood, not consistent with urinary or bowel bleed.  - Telemetry monitoring - CBC twice daily - Transfuse for hemoglobin less than 7.  Patient stated she will consent to blood transfusion - Iron panel, ferritin, B12 and folate pending  AF (paroxysmal atrial fibrillation) (HCC) - Hold home Eliquis in the setting of acute blood loss anemia - Resume home metoprolol tomorrow if blood pressure remains stable  Lymphedema - Resume home torsemide tomorrow if patient's blood pressure remains stable  Benign essential HTN Patient's blood pressure is on the lower end of normal compared to previous.  At office visits, it is usually at least systolics above 130.  - Hold home antihypertensives in the setting of relative hypotension  Chronic venous insufficiency Chronic venous insufficiency with evidence of chronic skin changes of bilateral lower extremities.  - SCDs and continue home compression  stockings  Severe  mitral insufficiency History of severe mitral insufficiency.  Follows with [facility].  Advance Care Planning:   Code Status: DNR.  Patient states she would not want to be resuscitated in the event of cardiac arrest, she does not feel she would have meaningful recovery with the damage that occurs with CPR.  However, she is amenable to a short-term resuscitative effort in the event of pulmonary only arrest.  Consults: None  Family Communication: Patient's daughter and son updated at bedside  Severity of Illness: The appropriate patient status for this patient is OBSERVATION. Observation status is judged to be reasonable and necessary in order to provide the required intensity of service to ensure the patient's safety. The patient's presenting symptoms, physical exam findings, and initial radiographic and laboratory data in the context of their medical condition is felt to place them at decreased risk for further clinical deterioration. Furthermore, it is anticipated that the patient will be medically stable for discharge from the hospital within 2 midnights of admission.   Author: Verdene Lennert, MD 08/07/2022 5:58 PM  For on call review [email or web address].

## 2022-08-08 DIAGNOSIS — Z801 Family history of malignant neoplasm of trachea, bronchus and lung: Secondary | ICD-10-CM | POA: Diagnosis not present

## 2022-08-08 DIAGNOSIS — I5032 Chronic diastolic (congestive) heart failure: Secondary | ICD-10-CM | POA: Diagnosis present

## 2022-08-08 DIAGNOSIS — I480 Paroxysmal atrial fibrillation: Secondary | ICD-10-CM | POA: Diagnosis present

## 2022-08-08 DIAGNOSIS — E785 Hyperlipidemia, unspecified: Secondary | ICD-10-CM | POA: Diagnosis present

## 2022-08-08 DIAGNOSIS — Z8041 Family history of malignant neoplasm of ovary: Secondary | ICD-10-CM | POA: Diagnosis not present

## 2022-08-08 DIAGNOSIS — Z85828 Personal history of other malignant neoplasm of skin: Secondary | ICD-10-CM | POA: Diagnosis not present

## 2022-08-08 DIAGNOSIS — D649 Anemia, unspecified: Secondary | ICD-10-CM | POA: Diagnosis present

## 2022-08-08 DIAGNOSIS — I890 Lymphedema, not elsewhere classified: Secondary | ICD-10-CM | POA: Diagnosis present

## 2022-08-08 DIAGNOSIS — Z885 Allergy status to narcotic agent status: Secondary | ICD-10-CM | POA: Diagnosis not present

## 2022-08-08 DIAGNOSIS — I110 Hypertensive heart disease with heart failure: Secondary | ICD-10-CM | POA: Diagnosis present

## 2022-08-08 DIAGNOSIS — K922 Gastrointestinal hemorrhage, unspecified: Secondary | ICD-10-CM | POA: Diagnosis not present

## 2022-08-08 DIAGNOSIS — Z66 Do not resuscitate: Secondary | ICD-10-CM | POA: Diagnosis present

## 2022-08-08 DIAGNOSIS — I959 Hypotension, unspecified: Secondary | ICD-10-CM | POA: Diagnosis present

## 2022-08-08 DIAGNOSIS — Z803 Family history of malignant neoplasm of breast: Secondary | ICD-10-CM | POA: Diagnosis not present

## 2022-08-08 DIAGNOSIS — I739 Peripheral vascular disease, unspecified: Secondary | ICD-10-CM | POA: Diagnosis present

## 2022-08-08 DIAGNOSIS — D62 Acute posthemorrhagic anemia: Secondary | ICD-10-CM | POA: Diagnosis present

## 2022-08-08 DIAGNOSIS — I340 Nonrheumatic mitral (valve) insufficiency: Secondary | ICD-10-CM | POA: Diagnosis not present

## 2022-08-08 DIAGNOSIS — Z8049 Family history of malignant neoplasm of other genital organs: Secondary | ICD-10-CM | POA: Diagnosis not present

## 2022-08-08 DIAGNOSIS — I10 Essential (primary) hypertension: Secondary | ICD-10-CM | POA: Diagnosis not present

## 2022-08-08 DIAGNOSIS — Z888 Allergy status to other drugs, medicaments and biological substances status: Secondary | ICD-10-CM | POA: Diagnosis not present

## 2022-08-08 DIAGNOSIS — I872 Venous insufficiency (chronic) (peripheral): Secondary | ICD-10-CM | POA: Diagnosis present

## 2022-08-08 DIAGNOSIS — Z79899 Other long term (current) drug therapy: Secondary | ICD-10-CM | POA: Diagnosis not present

## 2022-08-08 DIAGNOSIS — I051 Rheumatic mitral insufficiency: Secondary | ICD-10-CM | POA: Diagnosis present

## 2022-08-08 DIAGNOSIS — Z8249 Family history of ischemic heart disease and other diseases of the circulatory system: Secondary | ICD-10-CM | POA: Diagnosis not present

## 2022-08-08 DIAGNOSIS — Z7901 Long term (current) use of anticoagulants: Secondary | ICD-10-CM | POA: Diagnosis not present

## 2022-08-08 DIAGNOSIS — Z9049 Acquired absence of other specified parts of digestive tract: Secondary | ICD-10-CM | POA: Diagnosis not present

## 2022-08-08 LAB — CBC
HCT: 19.7 % — ABNORMAL LOW (ref 36.0–46.0)
HCT: 24.5 % — ABNORMAL LOW (ref 36.0–46.0)
Hemoglobin: 6.6 g/dL — ABNORMAL LOW (ref 12.0–15.0)
Hemoglobin: 8.1 g/dL — ABNORMAL LOW (ref 12.0–15.0)
MCH: 33.2 pg (ref 26.0–34.0)
MCH: 32.5 pg (ref 26.0–34.0)
MCHC: 33.5 g/dL (ref 30.0–36.0)
MCHC: 33.1 g/dL (ref 30.0–36.0)
MCV: 99 fL (ref 80.0–100.0)
MCV: 98.4 fL (ref 80.0–100.0)
Platelets: 124 10*3/uL — ABNORMAL LOW (ref 150–400)
Platelets: 143 10*3/uL — ABNORMAL LOW (ref 150–400)
RBC: 1.99 MIL/uL — ABNORMAL LOW (ref 3.87–5.11)
RBC: 2.49 MIL/uL — ABNORMAL LOW (ref 3.87–5.11)
RDW: 16.9 % — ABNORMAL HIGH (ref 11.5–15.5)
RDW: 17.9 % — ABNORMAL HIGH (ref 11.5–15.5)
WBC: 7.8 10*3/uL (ref 4.0–10.5)
WBC: 9.1 10*3/uL (ref 4.0–10.5)
nRBC: 0 % (ref 0.0–0.2)
nRBC: 0 % (ref 0.0–0.2)

## 2022-08-08 LAB — BASIC METABOLIC PANEL
Anion gap: 10 (ref 5–15)
BUN: 23 mg/dL (ref 8–23)
CO2: 27 mmol/L (ref 22–32)
Calcium: 8.4 mg/dL — ABNORMAL LOW (ref 8.9–10.3)
Chloride: 102 mmol/L (ref 98–111)
Creatinine, Ser: 0.86 mg/dL (ref 0.44–1.00)
GFR, Estimated: >60 mL/min (ref >60)
Glucose, Bld: 111 mg/dL — ABNORMAL HIGH (ref 70–99)
Potassium: 3.6 mmol/L (ref 3.5–5.1)
Sodium: 139 mmol/L (ref 135–145)

## 2022-08-08 LAB — PREPARE RBC (CROSSMATCH)

## 2022-08-08 LAB — VITAMIN B12: Vitamin B-12: 2412 pg/mL — ABNORMAL HIGH (ref 180–914)

## 2022-08-08 MED ORDER — METOPROLOL TARTRATE 25 MG PO TABS: 25.0000 mg | ORAL_TABLET | Freq: Two times a day (BID) | ORAL | Status: DC

## 2022-08-08 MED ORDER — SODIUM CHLORIDE 0.9% IV SOLUTION: Freq: Once | INTRAVENOUS | Status: AC

## 2022-08-08 MED ORDER — DILTIAZEM HCL 30 MG PO TABS
30.0000 mg | ORAL_TABLET | Freq: Four times a day (QID) | ORAL | Status: DC
  Administered 2022-08-08: 30 mg via ORAL
  Filled 2022-08-08: qty 1

## 2022-08-08 MED ORDER — ORAL CARE MOUTH RINSE: 15.0000 mL | OROMUCOSAL | Status: DC | PRN

## 2022-08-08 MED ORDER — SODIUM CHLORIDE 0.9 % IV SOLN: INTRAVENOUS | Status: DC

## 2022-08-08 NOTE — Evaluation (Signed)
Occupational Therapy Evaluation Patient Details Name: Lisa [name] MRN: [id number] DOB: 1928-06-21 Today's Date: 08/08/2022   History of Present Illness 89 y.o. female with medical history significant of atrial fibrillation on warfarin, HFpEF, severe mitral insufficiency, bilateral carotid artery disease, hypertension, chronic venous insufficiency, lymphedema admitted for anemia and weakness with concern for blood loss   Clinical Impression   Patient presenting with decreased ind in self care,balance, functional mobility/transfers, endurance, and safety awareness. Patient reports living at home independently with use of RW for mobility. Pt has PCA that comes 2x/wk for several hours to assist with IADLs.Pt performs bed mobility without assistance and stands with supervision to ambulate with RW to bathroom for toileting needs. Pt able to perform clothing management and hygiene without assistance but supervision for safety. Pt stands at sink for hand hygiene and then ambulates with RW with supervision to recliner chair. Daughter present in room.  Patient will benefit from acute OT to increase overall independence in the areas of ADLs, functional mobility, and safety awareness in order to safely discharge.     Recommendations for follow up therapy are one component of a multi-disciplinary discharge planning process, led by the attending physician.  Recommendations may be updated based on patient status, additional functional criteria and insurance authorization.   Assistance Recommended at Discharge Intermittent Supervision/Assistance  Patient can return home with the following A little help with walking and/or transfers;A little help with bathing/dressing/bathroom;Assistance with cooking/housework;Assist for transportation;Help with stairs or ramp for entrance    Functional Status Assessment  Patient has had a recent decline in their functional status and demonstrates the ability to make  significant improvements in function in a reasonable and predictable amount of time.  Equipment Recommendations  None recommended by OT       Precautions / Restrictions Precautions Precautions: Fall      Mobility Bed Mobility Overal bed mobility: Modified Independent             General bed mobility comments: HOB elevated but no physical assistance    Transfers Overall transfer level: Needs assistance Equipment used: Rolling walker (2 wheels) Transfers: Sit to/from Stand Sit to Stand: Supervision                  Balance Overall balance assessment: Needs assistance Sitting-balance support: Feet supported Sitting balance-Leahy Scale: Good     Standing balance support: Reliant on assistive device for balance, During functional activity, Bilateral upper extremity supported Standing balance-Leahy Scale: Fair                             ADL either performed or assessed with clinical judgement   ADL Overall ADL's : Needs assistance/impaired     Grooming: Wash/dry hands;Standing;Supervision/safety                   Toilet Transfer: Supervision/safety;Regular Toilet;Rolling walker (2 wheels)   Toileting- Architect and Hygiene: Supervision/safety;Sit to/from stand       Functional mobility during ADLs: Supervision/safety;Rolling walker (2 wheels)       Vision Patient Visual Report: No change from baseline              Pertinent Vitals/Pain Pain Assessment Pain Assessment: No/denies pain     Hand Dominance Right   Extremity/Trunk Assessment Upper Extremity Assessment Upper Extremity Assessment: Overall WFL for tasks assessed   Lower Extremity Assessment Lower Extremity Assessment: Generalized weakness       Communication Communication  Communication: HOH   Cognition Arousal/Alertness: Awake/alert Behavior During Therapy: WFL for tasks assessed/performed Overall Cognitive Status: Within Functional Limits for  tasks assessed                                 General Comments: Pt is pleasant and motivated                Home Living Family/patient expects to be discharged to:: Private residence Living Arrangements: Alone Available Help at Discharge: Family;Available PRN/intermittently;Neighbor;Personal care attendant Type of Home: House Home Access: Stairs to enter [employer] of Steps: 1 Entrance Stairs-Rails: None Home Layout: One level     Bathroom Shower/Tub: Walk-in shower;Tub/shower unit   Bathroom Toilet: Handicapped height     Home Equipment: Agricultural consultant (2 wheels);BSC/3in1;Grab bars - tub/shower;Grab bars - toilet          Prior Functioning/Environment Prior Level of Function : Independent/Modified Independent               ADLs Comments: Pt is Ind in ADLs. She has a PCA that comes 2x/wk for 2-4 hours that assist with getting groceries, cleaning her home, and laundry. Pt has someone deep clean house 1x/month.        OT Problem List: Decreased activity tolerance;Decreased safety awareness;Impaired balance (sitting and/or standing);Decreased knowledge of use of DME or AE      OT Treatment/Interventions: Self-care/ADL training;Balance training;Patient/family education;Energy conservation;Therapeutic exercise;Therapeutic activities    OT Goals(Current goals can be found in the care plan section) Acute Rehab OT Goals Patient Stated Goal: to get stronger OT Goal Formulation: With patient/family Time For Goal Achievement: 08/22/22 Potential to Achieve Goals: Good ADL Goals Pt Will Perform Grooming: with modified independence;standing Pt Will Perform Lower Body Dressing: with modified independence;sit to/from stand Pt Will Transfer to Toilet: with modified independence;ambulating Pt Will Perform Toileting - Clothing Manipulation and hygiene: with modified independence;sit to/from stand  OT Frequency: Min 2X/week       AM-PAC OT "6  Clicks" Daily Activity     Outcome Measure Help from another person eating meals?: None Help from another person taking care of personal grooming?: None Help from another person toileting, which includes using toliet, bedpan, or urinal?: None Help from another person bathing (including washing, rinsing, drying)?: A Little Help from another person to put on and taking off regular upper body clothing?: None Help from another person to put on and taking off regular lower body clothing?: A Little 6 Click Score: 22   End of Session Equipment Utilized During Treatment: Rolling walker (2 wheels) Nurse Communication: Mobility status  Activity Tolerance: Patient tolerated treatment well Patient left: with call bell/phone within reach;in chair;with chair alarm set  OT Visit Diagnosis: Unsteadiness on feet (R26.81);Muscle weakness (generalized) (M62.81)                Time: 1610-9604 OT Time Calculation (min): 24 min Charges:  OT General Charges $OT Visit: 1 Visit OT Evaluation $OT Eval Moderate Complexity: 1 Mod OT Treatments $Self Care/Home Management : 8-22 mins  Jackquline Denmark, MS, OTR/L , CBIS ascom [phone]  08/08/22, 3:19 PM

## 2022-08-08 NOTE — Consult Note (Signed)
Hospital Consult    Reason for Consult:  Bleeding from patients back. Requesting Physician:  Dr Delfino Lovett MD MRN #:  [id number]  History of Present Illness: This is a 89 y.o. female with medical history significant of atrial fibrillation on warfarin, HFpEF, severe mitral insufficiency, bilateral carotid artery disease, hypertension, chronic venous insufficiency, lymphedema, who presents to the ED due to weakness and blood loss.   Vascular surgery was consulted for the patient bleeding from her back.  On exam this morning patient was resting comfortably in bed.  I examined the patient's back from her cervical spine to her tailbone.  There is no open sores or cuts scrapes or anything to note that could be bleeding.  I removed a very large Mepilex that was placed on her back where she had told nursing she was bleeding from.  The dressing was pristine clean without any bleeding to note on it.  Her daughter was present upon this examination and agrees with my findings.  Her daughter then showed me a picture of the patient's bathroom at home after a bleeding episode. It was blatantly obvious this patient has a GI bleed.  The patient will not agree to this stating " my underwear was not bloody" .  I informed the daughter that I would let the hospitalist know that the patient's current hemoglobin was 6.6 and that she needs a gastrointestinal consult soon as possible.  Past Medical History:  Diagnosis Date   A-fib [facility])    Allergic rhinitis    with chronic nasal congestion   Anemia, unspecified    Basal cell carcinoma    Change in bowel habit 09/30/2016   Chicken pox    Constipation    Cystocele    Enthesopathy of hip region    Fatigue 09/30/2016   Fibrocystic breast disease    Hyperlipemia    Hypertension    Incomplete bladder emptying    Irregular heart beat    and shortness of breath with SVT, PAT, and ventricular ectopy    Menopausal state    PAC (premature atrial contraction)     Palpitations    Peripheral vascular disease (HCC)    PVC (premature ventricular contraction)    Sinoatrial node dysfunction (HCC)    Urethral caruncle    Urinary urgency    Varicella zoster 04/2007   Weight loss, unintentional 09/30/2016    Past Surgical History:  Procedure Laterality Date   ABLATION     APPENDECTOMY     BASAL CELL CARCINOMA EXCISION     BREAST CYST ASPIRATION Left    CARDIAC SURGERY     CARDIOVERSION     CARDIOVERSION N/A 08/01/2018   Procedure: CARDIOVERSION;  Surgeon: Lamar Blinks, MD;  Location: [facility] ORS;  Service: Cardiovascular;  Laterality: N/A;   CATARACT EXTRACTION Bilateral 2015   CHOLECYSTECTOMY     ELECTROPHYSIOLOGIC STUDY N/A 09/02/2015   Procedure: Cardioversion;  Surgeon: Lamar Blinks, MD;  Location: [facility] ORS;  Service: Cardiovascular;  Laterality: N/A;   ELECTROPHYSIOLOGIC STUDY N/A 10/08/2015   Procedure: CARDIOVERSION;  Surgeon: Lamar Blinks, MD;  Location: [facility] ORS;  Service: Cardiovascular;  Laterality: N/A;   HIP SURGERY     INTRAMEDULLARY (IM) NAIL INTERTROCHANTERIC Right 06/21/2017   Procedure: INTRAMEDULLARY (IM) NAIL INTERTROCHANTRIC;  Surgeon: Donato Heinz, MD;  Location: [facility] ORS;  Service: Orthopedics;  Laterality: Right;   KYPHOPLASTY N/A 11/08/2016   Procedure: [id number];  Surgeon: Kennedy Bucker, MD;  Location: [facility] ORS;  Service: Orthopedics;  Laterality:  N/A;   TOTAL ABDOMINAL HYSTERECTOMY W/ BILATERAL SALPINGOOPHORECTOMY     WISDOM TOOTH EXTRACTION     WRIST FRACTURE SURGERY Right     Allergies  Allergen Reactions   Ace Inhibitors Cough   Benadryl [Diphenhydramine]     Urinary retention    Trazodone     Other reaction(s): Other (See Comments) Unable to urinate    Prior to Admission medications   Medication Sig Start Date End Date Taking? Authorizing Provider  acetaminophen (TYLENOL) 500 MG tablet Take 1,000 mg by mouth daily as needed for moderate pain.   Yes [provider]  cholecalciferol  (VITAMIN D) 1000 units tablet Take 2,000 Units by mouth daily.    Yes [provider]  ELIQUIS 2.5 MG TABS tablet Take 2.5 mg by mouth 2 (two) times daily.  03/17/17  Yes [provider]  estradiol (ESTRACE) 0.1 MG/GM vaginal cream Place 1 Applicatorful vaginally 3 (three) times a week.   Yes [provider]  metoprolol tartrate (LOPRESSOR) 50 MG tablet Take 50 mg by mouth 2 (two) times daily.   Yes [provider]  Multiple Vitamin (MULTIVITAMIN WITH MINERALS) TABS tablet Take 1 tablet by mouth daily. 06/24/17  Yes Enid Baas, MD  potassium chloride (KLOR-CON) 10 MEQ tablet Take 10 mEq by mouth daily.   Yes [provider]  psyllium (METAMUCIL) 58.6 % packet Take 1 packet by mouth daily.   Yes [provider]  torsemide (DEMADEX) 10 MG tablet Take 20 mg by mouth daily.   Yes [provider]  polyethylene glycol (MIRALAX / GLYCOLAX) packet Take 17 g by mouth daily as needed for mild constipation.  Patient not taking: Reported on 08/07/2022    [provider]    Social History   Socioeconomic History   Marital status: Widowed    Spouse name: Not on file   Number of children: 3   Years of education: 16   Highest education level: Bachelor's degree (e.g., BA, AB, BS)  Occupational History   Not on file  Tobacco Use   Smoking status: Never   Smokeless tobacco: Never  Vaping Use   Vaping Use: Never used  Substance and Sexual Activity   Alcohol use: No   Drug use: No   Sexual activity: Never  Other Topics Concern   Not on file  Social History Narrative   Lives independently, has a walker to ambulate.   Social Determinants of Health   Financial Resource Strain: Not on file  Food Insecurity: No Food Insecurity (08/07/2022)   Hunger Vital Sign    Worried About Running Out of Food in the Last Year: Never true    Ran Out of Food in the Last Year: Never true  Transportation Needs: No Transportation Needs  (08/07/2022)   PRAPARE - Administrator, Civil Service (Medical): No    Lack of Transportation (Non-Medical): No  Physical Activity: Not on file  Stress: Not on file  Social Connections: Not on file  Intimate Partner Violence: Not At Risk (08/07/2022)   Humiliation, Afraid, Rape, and Kick questionnaire    Fear of Current or Ex-Partner: No    Emotionally Abused: No    Physically Abused: No    Sexually Abused: No     Family History  Problem Relation Age of Onset   Heart failure Father    Coronary artery disease Father    Heart disease Father    Heart attack Father    Breast cancer Sister 21  Ovarian cancer Sister    Lung cancer Sister    Uterine cancer Sister    Heart failure Brother    Anesthesia problems Mother        with hip surgery age 49's / mental status change   Hypertension Mother    Colon cancer Neg Hx     ROS: Otherwise negative unless mentioned in HPI  Physical Examination  Vitals:   08/08/22 1057 08/08/22 1111  BP: 118/62 113/61  Pulse: (!) 114 94  Resp: 16 18  Temp: 97.9 F (36.6 C) 97.9 F (36.6 C)  SpO2: 96% 100%   There is no height or weight on file to calculate BMI.  General:  WDWN in NAD Gait: Not observed HENT: WNL, normocephalic Pulmonary: normal non-labored breathing, without Rales, rhonchi,  wheezing Cardiac: regular, without  Murmurs, rubs or gallops; without carotid bruits. Positive Murmur heard best in the left lower sternal border and apex.  Abdomen: Hyperactive bowel sounds, soft, NT/ND, no masses Skin: without rashes, Patient's back and bilateral lower extremities were examined. Large Mepilex was removed and NO bleeding noted. Dressing was pristine and clean.  On the back, extensive seborrheic keratoses, but no evidence of recent bleeding. 1 small cherry angioma with no evidence of recent bleeding. On the superior and lateral aspect of bilateral thighs, there are many superficial varicose veins. No evidence of recent  bleeding. No external hemorrhoids.  Vascular Exam/Pulses: Palpable pulses in upper extremities, Unable to palpate  Extremities: without ischemic changes, without Gangrene , without cellulitis; without open wounds;  Musculoskeletal: no muscle wasting or atrophy  Neurologic: A&O X 3;  No focal weakness or paresthesias are detected; speech is fluent/normal Psychiatric:  The pt has  delusional  affect. She believes her back is bleeding while she is having a GI Bleed with HGB of 6.6 Lymph:  Unremarkable  CBC    Component Value Date/Time   WBC 7.8 08/08/2022 0516   RBC 1.99 (L) 08/08/2022 0516   HGB 6.6 (L) 08/08/2022 0516   HCT 19.7 (L) 08/08/2022 0516   PLT 124 (L) 08/08/2022 0516   MCV 99.0 08/08/2022 0516   MCH 33.2 08/08/2022 0516   MCHC 33.5 08/08/2022 0516   RDW 16.9 (H) 08/08/2022 0516   LYMPHSABS 1.2 07/03/2017 1740   MONOABS 0.6 07/03/2017 1740   EOSABS 0.1 07/03/2017 1740   BASOSABS 0.0 07/03/2017 1740    BMET    Component Value Date/Time   NA 139 08/08/2022 0516   K 3.6 08/08/2022 0516   CL 102 08/08/2022 0516   CO2 27 08/08/2022 0516   GLUCOSE 111 (H) 08/08/2022 0516   BUN 23 08/08/2022 0516   CREATININE 0.86 08/08/2022 0516   CALCIUM 8.4 (L) 08/08/2022 0516   GFRNONAA >60 08/08/2022 0516   GFRAA >60 07/03/2017 1740    COAGS: Lab Results  Component Value Date   INR 2.0 (H) 08/07/2022   INR 1.09 06/19/2017     Non-Invasive Vascular Imaging:   NONE  Statin:  No. Beta Blocker:  Yes.   Aspirin:  No. ACEI:  No. ARB:  No. CCB use:  No Other antiplatelets/anticoagulants:  Yes.   Eliquis 2.5 mg Twice Daily   ASSESSMENT/PLAN: This is a 89 y.o. female presented to [facility] emergency department with weakness and blood loss.  Patient claims that the blood loss was from her back.  Upon exam the patient is not bleeding from her back.  Patient's daughter has a picture of the GI bleeding in her bathroom at home.  Patient's hemoglobin this morning was 6.6.  Patient  noted to have an INR of 2.0 and has been on Eliquis 2.5 mg twice daily.  PLAN: Vascular surgery recommends a GI consult stat.  Vascular surgery is willing to embolize once the source is located that cannot be controlled during an endoscopic procedure.  Please reconsult [place] if GI embolization is needed for uncontrolled GI bleed.   -Discussed the plan in detail with Dr. Festus Barren MD and he agrees with the plan.   Marcie Bal [facility] 08/08/2022 12:17 PM

## 2022-08-08 NOTE — Progress Notes (Signed)
  Progress Note   Patient: Lisa [name] WGN:[id number] DOB: 10-Oct-1928 DOA: 08/07/2022     0 DOS: the patient was seen and examined on 08/08/2022   Brief hospital course: 89 y.o. female with medical history significant of atrial fibrillation on warfarin, HFpEF, severe mitral insufficiency, bilateral carotid artery disease, hypertension, chronic venous insufficiency, lymphedema admitted for anemia and weakness with concern for blood loss  5/13: GI, palliative care and vascular surgery consult  Assessment and Plan: * Symptomatic anemia Patient is presenting with symptomatic anemia in the setting of acute blood loss anemia of unknown source.  Her previous hemoglobins were in the mid 14, currently 6.6.  -GI consult for possible GI bleed evaluation -1 PRBC transfusion for hemoglobin of 6.6 -Patient requested evaluation by vascular surgery as she follows with Dr. Gilda Crease and was hoping to see them while here.  She is very clear that she is not bleeding from urine or stool and may be something in her back although her daughter refutes that -Elevated B12, normal iron and folate studies, low iron saturation  AF (paroxysmal atrial fibrillation) (HCC) - Hold home Eliquis in the setting of acute blood loss anemia - Resume home metoprolol at lower dose of 25 mg twice a day  Lymphedema - Hold home torsemide as patient's blood pressure remains low  Benign essential HTN Patient's blood pressure is on the lower end of normal compared to previous.  At office visits, it is usually at least systolics above 130. -Keep metoprolol at 25 mg twice a day for now  Chronic venous insufficiency Chronic venous insufficiency with evidence of chronic skin changes of bilateral lower extremities.  - SCDs and continue home compression stockings -Vascular surgery consultation.  She follows with Dr. Gilda Crease in the office  Severe mitral insufficiency History of severe mitral insufficiency.  Follows with [facility].  Holding Eliquis for now        Subjective: Patient states she is not bleeding from urine or stool in her back lesions abruptly starts bleeding when on the back of her wall and toilet was bloody.  She was requesting to see if Dr. Milford Cage can evaluate her as she has been following with them as an outpatient, feeling weak  Physical Exam: Vitals:   08/08/22 1015 08/08/22 1057 08/08/22 1111 08/08/22 1320  BP: (!) 113/57 118/62 113/61 99/62  Pulse: (!) 113 (!) 114 94 86  Resp: 16 16 18 16   Temp: 99 F (37.2 C) 97.9 F (36.6 C) 97.9 F (36.6 C) 98.1 F (36.7 C)  TempSrc:  Oral Oral Oral  SpO2: 95% 96% 27% 9%   89 year old female lying in the bed comfortably without any acute distress Lungs clear to auscultation bilaterally Heart systolic ejection murmur at the left sternal border and apex Abdomen soft, benign Neuro alert and awake, nonfocal Psych normal mood and affect Extremities/skin lymphedema present in the lower extremities.  Patient's back has a large dressing/Mepilex with no obvious bleeding around it Data Reviewed:  Hemoglobin 6.6, vitamin B12 2412, INR 2.0  Family Communication: Daughter updated over phone  Disposition: Status is: Inpatient Remains inpatient appropriate because: Management of bleeding/anemia   Planned Discharge Destination: Home   DVT prophylaxis-SCDs Time spent: 35 minutes  Author: Delfino Lovett, MD 08/08/2022 2:40 PM  For on call review [email or web address].

## 2022-08-08 NOTE — Hospital Course (Signed)
89 y.o. female with medical history significant of atrial fibrillation on warfarin, HFpEF, severe mitral insufficiency, bilateral carotid artery disease, hypertension, chronic venous insufficiency, lymphedema admitted for anemia and weakness with concern for blood loss  5/13: GI, palliative care and vascular surgery consult

## 2022-08-08 NOTE — Consult Note (Signed)
[name] , MD [address], [place], [place], [address] [address], [address], [place], [place], [address] Phone: [phone]  Fax: [phone]  Consultation  Referring Provider:     Dr Sherryll Burger  Primary Care Physician:  [name], MD Primary Gastroenterologist:  [facility]          Reason for Consultation:     GI bleed  Date of Admission:  08/07/2022 Date of Consultation:  08/08/2022         HPI:   Lisa [name] is a 89 y.o. female with a history of atrial fibrillation on Eliquis, heart failure, mitral insufficiency that is severe hypertension lymphedema present to the ER with multiple episodes of rectal bleeding going on for 3 days.  In the emergency room her hemoglobin was 7.6 g with an MCV of 102.  5 years back hemoglobin is 9.9 g.  And examined in the ER noted to have superficial varicose veins on bilateral thighs.  There is some concern about blood loss could be from there.  There was some concern it is not from her bowels.  Patient denies any blood in her stool but apparently the patient's daughter has brought in pictures showing blood with the stool and there is significant concern for rectal bleeding.  Patient does not want a colonoscopy at this point of time.  He says the bleeding has been going on for a few weeks.  Denies any abdominal pain.  B12 levels normal.  Ferritin normal.  Iron and TIBC normal.  Folate normal.  INR 2.0.  Review of epic shows no prior endoscopy procedures. Past Medical History:  Diagnosis Date   A-fib [facility])    Allergic rhinitis    with chronic nasal congestion   Anemia, unspecified    Basal cell carcinoma    Change in bowel habit 09/30/2016   Chicken pox    Constipation    Cystocele    Enthesopathy of hip region    Fatigue 09/30/2016   Fibrocystic breast disease    Hyperlipemia    Hypertension    Incomplete bladder emptying    Irregular heart beat    and shortness of breath with SVT, PAT, and ventricular ectopy     Menopausal state    PAC (premature atrial contraction)    Palpitations    Peripheral vascular disease (HCC)    PVC (premature ventricular contraction)    Sinoatrial node dysfunction (HCC)    Urethral caruncle    Urinary urgency    Varicella zoster 04/2007   Weight loss, unintentional 09/30/2016    Past Surgical History:  Procedure Laterality Date   ABLATION     APPENDECTOMY     BASAL CELL CARCINOMA EXCISION     BREAST CYST ASPIRATION Left    CARDIAC SURGERY     CARDIOVERSION     CARDIOVERSION N/A 08/01/2018   Procedure: CARDIOVERSION;  Surgeon: Lamar Blinks, MD;  Location: [facility] ORS;  Service: Cardiovascular;  Laterality: N/A;   CATARACT EXTRACTION Bilateral 2015   CHOLECYSTECTOMY     ELECTROPHYSIOLOGIC STUDY N/A 09/02/2015   Procedure: Cardioversion;  Surgeon: Lamar Blinks, MD;  Location: [facility] ORS;  Service: Cardiovascular;  Laterality: N/A;   ELECTROPHYSIOLOGIC STUDY N/A 10/08/2015   Procedure: CARDIOVERSION;  Surgeon: Lamar Blinks, MD;  Location: [facility] ORS;  Service: Cardiovascular;  Laterality: N/A;   HIP SURGERY     INTRAMEDULLARY (IM) NAIL INTERTROCHANTERIC Right 06/21/2017   Procedure: INTRAMEDULLARY (IM) NAIL INTERTROCHANTRIC;  Surgeon:  Hooten, Illene Labrador, MD;  Location: [facility] ORS;  Service: Orthopedics;  Laterality: Right;   KYPHOPLASTY N/A 11/08/2016   Procedure: [id number];  Surgeon: Kennedy Bucker, MD;  Location: [facility] ORS;  Service: Orthopedics;  Laterality: N/A;   TOTAL ABDOMINAL HYSTERECTOMY W/ BILATERAL SALPINGOOPHORECTOMY     WISDOM TOOTH EXTRACTION     WRIST FRACTURE SURGERY Right     Prior to Admission medications   Medication Sig Start Date End Date Taking? Authorizing Provider  acetaminophen (TYLENOL) 500 MG tablet Take 1,000 mg by mouth daily as needed for moderate pain.   Yes [provider]  cholecalciferol (VITAMIN D) 1000 units tablet Take 2,000 Units by mouth daily.    Yes [provider]  ELIQUIS 2.5 MG TABS tablet Take 2.5 mg  by mouth 2 (two) times daily.  03/17/17  Yes [provider]  estradiol (ESTRACE) 0.1 MG/GM vaginal cream Place 1 Applicatorful vaginally 3 (three) times a week.   Yes [provider]  metoprolol tartrate (LOPRESSOR) 50 MG tablet Take 50 mg by mouth 2 (two) times daily.   Yes [provider]  Multiple Vitamin (MULTIVITAMIN WITH MINERALS) TABS tablet Take 1 tablet by mouth daily. 06/24/17  Yes Enid Baas, MD  potassium chloride (KLOR-CON) 10 MEQ tablet Take 10 mEq by mouth daily.   Yes [provider]  psyllium (METAMUCIL) 58.6 % packet Take 1 packet by mouth daily.   Yes [provider]  torsemide (DEMADEX) 10 MG tablet Take 20 mg by mouth daily.   Yes [provider]  polyethylene glycol (MIRALAX / GLYCOLAX) packet Take 17 g by mouth daily as needed for mild constipation.  Patient not taking: Reported on 08/07/2022    [provider]    Family History  Problem Relation Age of Onset   Heart failure Father    Coronary artery disease Father    Heart disease Father    Heart attack Father    Breast cancer Sister 59   Ovarian cancer Sister    Lung cancer Sister    Uterine cancer Sister    Heart failure Brother    Anesthesia problems Mother        with hip surgery age 32's / mental status change   Hypertension Mother    Colon cancer Neg Hx      Social History   Tobacco Use   Smoking status: Never   Smokeless tobacco: Never  Vaping Use   Vaping Use: Never used  Substance Use Topics   Alcohol use: No   Drug use: No    Allergies as of 08/07/2022 - Review Complete 08/07/2022  Allergen Reaction Noted   Ace inhibitors Cough 05/17/2016   Benadryl [diphenhydramine]  07/24/2018   Trazodone  11/02/2020    Review of Systems:    All systems reviewed and negative except where noted in HPI.   Physical Exam:  Vital signs in last 24 hours: Temp:  [97.6 F (36.4 C)-99 F (37.2 C)] 97.9 F (36.6 C) (05/13 1057) Pulse  Rate:  [87-114] 114 (05/13 1057) Resp:  [16-20] 16 (05/13 1057) BP: (102-133)/(57-69) 118/62 (05/13 1057) SpO2:  [95 %-100 %] 96 % (05/13 1057) Last BM Date : 08/07/22 General:   Pleasant, cooperative in NAD Head:  Normocephalic and atraumatic. Eyes:   No icterus.   Conjunctiva pink. PERRLA. Ears:  Normal auditory acuity. Neck:  Supple; no masses or thyroidomegaly Lungs: Respirations even and unlabored. Lungs clear to auscultation bilaterally.   No wheezes, crackles, or rhonchi.  Heart:  Regular rate and rhythm;  Without murmur, clicks, rubs or gallops Abdomen:  Soft, nondistended, nontender. Normal bowel sounds. No appreciable masses or hepatomegaly.  No rebound or guarding.  Neurologic:  Alert and oriented x3;  grossly normal neurologically. Skin:  Intact without significant lesions or rashes. Cervical Nodes:  No significant cervical adenopathy. Psych:  Alert and cooperative. Normal affect.  LAB RESULTS: Recent Labs    08/07/22 1221 08/07/22 1523 08/08/22 0516  WBC 6.8  --  7.8  HGB 7.6* 7.8* 6.6*  HCT 23.8* 24.4* 19.7*  PLT 133*  --  124*   BMET Recent Labs    08/07/22 1221 08/08/22 0516  NA 135 139  K 3.5 3.6  CL 100 102  CO2 28 27  GLUCOSE 147* 111*  BUN 24* 23  CREATININE 0.93 0.86  CALCIUM 8.7* 8.4*   LFT No results for input(s): "PROT", "ALBUMIN", "AST", "ALT", "ALKPHOS", "BILITOT", "BILIDIR", "IBILI" in the last 72 hours. PT/INR Recent Labs    08/07/22 1523  LABPROT 22.7*  INR 2.0*    STUDIES: No results found.    Impression / Plan:   EVONY STOUTAMIRE is a 89 y.o. y/o female with a history of atrial fibrillation on Eliquis presents to the ER with blood loss anemia.  Patient denies any rectal bleeding states the blood is coming out from the lesion from a skin but the patient's daughter has said that she has seen blood in the toilet bowl and has brought in pictures which have been seen by staff during admission.  Further evaluation would require a  colonoscopy which she is not keen at this point of time.  I would recommend that we transfuse her to get her hemoglobin over 7 g and when INR is less than 1.5 I will reattempt a discussion with the patient to see if she would permit me to do a colonoscopy for which she will need to drink a prep to rule out any lower GI bleed.  I will see her again tomorrow  Thank you for involving me in the care of this patient.      LOS: 0 days   [name], MD  08/08/2022, 11:06 AM

## 2022-08-08 NOTE — Progress Notes (Signed)
PT Cancellation Note  Patient Details Name: Lisa [name] MRN: [id number] DOB: Jul 31, 1928   Cancelled Treatment:    Reason Eval/Treat Not Completed: Patient not medically ready.  Chart reviewed.  Pt's Hgb noted to be 6.6 this morning and currently receiving blood transfusion.  Will re-attempt PT session/eval at a later date/time.  Hendricks Limes, PT 08/08/22, 11:20 AM

## 2022-08-09 DIAGNOSIS — I10 Essential (primary) hypertension: Secondary | ICD-10-CM | POA: Diagnosis not present

## 2022-08-09 DIAGNOSIS — D649 Anemia, unspecified: Secondary | ICD-10-CM | POA: Diagnosis not present

## 2022-08-09 DIAGNOSIS — I340 Nonrheumatic mitral (valve) insufficiency: Secondary | ICD-10-CM | POA: Diagnosis not present

## 2022-08-09 DIAGNOSIS — I890 Lymphedema, not elsewhere classified: Secondary | ICD-10-CM | POA: Diagnosis not present

## 2022-08-09 LAB — CBC
HCT: 23 % — ABNORMAL LOW (ref 36.0–46.0)
Hemoglobin: 7.4 g/dL — ABNORMAL LOW (ref 12.0–15.0)
MCH: 31.6 pg (ref 26.0–34.0)
MCHC: 32.2 g/dL (ref 30.0–36.0)
MCV: 98.3 fL (ref 80.0–100.0)
Platelets: 125 10*3/uL — ABNORMAL LOW (ref 150–400)
RBC: 2.34 MIL/uL — ABNORMAL LOW (ref 3.87–5.11)
RDW: 17.9 % — ABNORMAL HIGH (ref 11.5–15.5)
WBC: 7.8 10*3/uL (ref 4.0–10.5)
nRBC: 0 % (ref 0.0–0.2)

## 2022-08-09 LAB — PROTIME-INR
INR: 1.6 — ABNORMAL HIGH (ref 0.8–1.2)
Prothrombin Time: 19 seconds — ABNORMAL HIGH (ref 11.4–15.2)

## 2022-08-09 LAB — BASIC METABOLIC PANEL
Anion gap: 5 (ref 5–15)
BUN: 24 mg/dL — ABNORMAL HIGH (ref 8–23)
CO2: 29 mmol/L (ref 22–32)
Calcium: 8.4 mg/dL — ABNORMAL LOW (ref 8.9–10.3)
Chloride: 102 mmol/L (ref 98–111)
Creatinine, Ser: 0.81 mg/dL (ref 0.44–1.00)
GFR, Estimated: >60 mL/min (ref >60)
Glucose, Bld: 103 mg/dL — ABNORMAL HIGH (ref 70–99)
Potassium: 3.3 mmol/L — ABNORMAL LOW (ref 3.5–5.1)
Sodium: 136 mmol/L (ref 135–145)

## 2022-08-09 LAB — TYPE AND SCREEN
ABO/RH(D): A NEG
Unit division: 0

## 2022-08-09 LAB — BPAM RBC
ISSUE DATE / TIME: 202405131030
ISSUE DATE / TIME: 202405141030
Unit Type and Rh: 600

## 2022-08-09 LAB — PREPARE RBC (CROSSMATCH)

## 2022-08-09 MED ORDER — SODIUM CHLORIDE 0.9% IV SOLUTION: Freq: Once | INTRAVENOUS | Status: AC

## 2022-08-09 MED ORDER — SENNOSIDES-DOCUSATE SODIUM 8.6-50 MG PO TABS
2.0000 | ORAL_TABLET | Freq: Two times a day (BID) | ORAL | Status: DC
  Administered 2022-08-09: 2 via ORAL
  Filled 2022-08-09: qty 2

## 2022-08-09 MED ORDER — POTASSIUM CHLORIDE CRYS ER 20 MEQ PO TBCR
40.0000 meq | EXTENDED_RELEASE_TABLET | Freq: Once | ORAL | Status: AC
  Administered 2022-08-09: 40 meq via ORAL
  Filled 2022-08-09: qty 2

## 2022-08-09 MED ORDER — POLYETHYLENE GLYCOL 3350 17 G PO PACK
17.0000 g | PACK | Freq: Every day | ORAL | Status: DC
  Administered 2022-08-09: 17 g via ORAL
  Filled 2022-08-09 (×2): qty 1

## 2022-08-09 NOTE — Progress Notes (Signed)
PT Cancellation Note  Patient Details Name: Lisa [name] MRN: [id number] DOB: 09/13/1928   Cancelled Treatment:    Reason Eval/Treat Not Completed: Other (comment).  Pt currently receiving blood transfusion.  Will re-attempt PT evaluation this afternoon.  Hendricks Limes, PT 08/09/22, 11:08 AM

## 2022-08-09 NOTE — Plan of Care (Signed)

## 2022-08-09 NOTE — Progress Notes (Signed)
PT Cancellation Note  Patient Details Name: Lisa [name] MRN: [id number] DOB: [birth year]   Cancelled Treatment:    Reason Eval/Treat Not Completed: Other (comment).  Pt recently discharged home.  Unable to initiate PT evaluation.  Per chart orders for HHPT; pt has RW at home; and pt's daughter Darl Pikes is planning on staying with pt on discharge to assist.  Hendricks Limes, PT 08/09/22, 3:52 PM

## 2022-08-09 NOTE — Progress Notes (Signed)
   [name] , MD [address], [address], [place], [place], [address] 3940 [address], [address], [place], [place], [address] Phone: [phone]  Fax: [phone]   Lisa [name] is being followed for several GI day 1 of follow up   Subjective: Denies any bowel movements today   Objective: Vital signs in last 24 hours: Vitals:   08/08/22 1320 08/08/22 1543 08/09/22 0015 08/09/22 0732  BP: 99/62 (!) 86/48 115/70 (!) 108/54  Pulse: 86 80 95 (!) 102  Resp: 16 17 18 17   Temp: 98.1 F (36.7 C) 97.9 F (36.6 C) 98.2 F (36.8 C) (!) 97.5 F (36.4 C)  TempSrc: Oral  Oral   SpO2: 100% 95% 100% 97%   Weight change:  No intake or output data in the 24 hours ending 08/09/22 1015   Exam:  Abdomen: soft, nontender, normal bowel sounds   Lab Results: @LABTEST2 @ Micro Results: No results found for this or any previous visit (from the past 240 hour(s)). Studies/Results: No results found. Medications: I have reviewed the patient's current medications. Scheduled Meds:  iron polysaccharides  150 mg Oral Daily   polyethylene glycol  17 g Oral Daily   potassium chloride  10 mEq Oral Daily   senna-docusate  2 tablet Oral BID   sodium chloride flush  3 mL Intravenous Q12H   Continuous Infusions:  sodium chloride 75 mL/hr at 08/08/22 1619   PRN Meds:.acetaminophen **OR** acetaminophen, ondansetron **OR** ondansetron (ZOFRAN) IV, mouth rinse   Assessment: Principal Problem:   Symptomatic anemia Active Problems:   Benign essential HTN   Severe mitral insufficiency   AF (paroxysmal atrial fibrillation) (HCC)   Lymphedema   Chronic venous insufficiency   Gastrointestinal hemorrhage  Lisa [name] is a 89 y.o. y/o female with a history of atrial fibrillation on Eliquis presents to the ER with blood loss anemia.  Patient denies any rectal bleeding states the blood is coming out from the lesion from a skin but the patient's daughter has said that she has seen blood in the  toilet bowl and has brought in pictures which have been seen by staff during admission. Overnight Hb 7.4 grams , INR 1.6  Further evaluation would require a colonoscopy which she is not keen at this point of time.  I tried talking to her again about it today I asked her to discuss with her daughter and I will be happy to perform a colonoscopy if she is willing to go through the prep        LOS: 1 day   [name], MD 08/09/2022, 10:15 AM

## 2022-08-09 NOTE — Progress Notes (Signed)
Civil engineer, contracting [facility]) Hospital Liaison Note:   (new referral for outpatient palliative services) Notified by [facility], Jonetta Speak,  of patient/family request for [facility] Palliative Care services at home after discharge. Patient scheduled for discharge home today.  Referral submitted to Palliative Admin.  Please call with any hospice or outpatient palliative care related questions.  Thank you for the opportunity to participate in this patient's care.  Redge Gainer, [facility] Liaison [phone]

## 2022-08-09 NOTE — Plan of Care (Signed)
Patient discharged per MD orders at this time.All dc instructions,medications and education reviewed with the patient.Pt expressed understanding and will comply with dc instructions.f/u appointments was also communicated to the patient.no verbal c/o or any ssx of distress.Pt was dc home with self-care per order.Pt was transported home by daughter in a privately owned vehicle.

## 2022-08-09 NOTE — Progress Notes (Signed)
PT Cancellation Note  Patient Details Name: Lisa [name] MRN: [id number] DOB: 1928-08-26   Cancelled Treatment:    Reason Eval/Treat Not Completed: Other (comment).  Pt still receiving blood transfusion.  Will re-attempt PT evaluation at a later date/time.  Hendricks Limes, PT 08/09/22, 1:19 PM

## 2022-08-09 NOTE — TOC Transition Note (Signed)
Transition of Care [facility]) - CM/SW Discharge Note   Patient Details  Name: Lisa [name] MRN: [id number] Date of Birth: 02-Dec-1928  Transition of Care [facility]) CM/SW Contact:  Garret Reddish, RN Phone Number: 08/09/2022, 3:08 PM   Clinical Narrative:  Chart reviewed.  Noted that patient has discharge orders.  Noted orders for Home health PT, OT and in home aide.  Patient also has orders for Palliative Care at home.    I have spoken with patient's daughter Darl Pikes. Darl Pikes is at the bedside with [name].  [name] is currently receiving a blood transfusion.  I have spoken to patient and her daughter Darl Pikes about Home Health and Palliative Care consult. Patient reported that she has used [facility] in the past for home care services and she would like to use [facility] again for home care services.  I have asked Barbara Cower with [facility] to accept home care referral.  Barbara Cower has informed me that patient's plan requires her to use a Home Health agency that is preferred by her insurance carrier.  I have informed [name] of this information.  [name] reports that she did not have a preference at this time.  I have asked [place] with [facility] to accept home health referral.  [place] reports that [facility] is a preferred agency with [employer].    I have also spoken with patient about referral for Palliative Care.  Patient and her daughter Darl Pikes did not have a preference for a Palliative Care agency.  I have asked Ree Kida with [facility] to accept Palliative Care referral.    Prior to admission patient lived at home by herself.  Patient has a supportive daughter and son that would assist with needs at home.  Darl Pikes plans on staying with her mother on discharge to assist with discharge needs.  Darl Pikes reports that patient has a 2 wheeled rolling walker at home.    I have informed staff nurse of above information. Darl Pikes will transport patient home today.      Final next level of care:  Home w Home Health Services Barriers to Discharge: No Barriers Identified   Patient Goals and CMS Choice CMS Medicare.gov Compare Post Acute Care list provided to:: Patient Choice offered to / list presented to : Patient, Adult Children  Discharge Placement                    Name of family member notified: Darl Pikes Patient and family notified of of transfer: 08/09/22  Discharge Plan and Services Additional resources added to the After Visit Summary for                            [facility] Arranged: PT, OT, Nurse's Aide HH Agency: [facility] Date [facility] Agency Contacted: 08/09/22 Time HH Agency Contacted: 1400 Representative spoke with at [facility] Agency: [place]  Social Determinants of Health (SDOH) Interventions SDOH Screenings   Food Insecurity: No Food Insecurity (08/07/2022)  Housing: Low Risk  (08/07/2022)  Transportation Needs: No Transportation Needs (08/07/2022)  Utilities: Not At Risk (08/07/2022)  Tobacco Use: Low Risk  (08/07/2022)     Readmission Risk Interventions     No data to display

## 2022-08-10 LAB — TYPE AND SCREEN
Antibody Screen: NEGATIVE
Unit division: 0

## 2022-08-10 LAB — BPAM RBC
Blood Product Expiration Date: 202405262359
Blood Product Expiration Date: 202405312359
Unit Type and Rh: 600

## 2022-08-10 NOTE — Discharge Summary (Signed)
Physician Discharge Summary   Patient: Lisa [name] MRN: [id number] DOB: [birth year]  Admit date:     08/07/2022  Discharge date: 08/09/2022  Discharge Physician: Delfino Lovett   PCP: [name], MD   Recommendations at discharge:    F/up with outpt providers as requested  Discharge Diagnoses: Principal Problem:   Symptomatic anemia Active Problems:   AF (paroxysmal atrial fibrillation) (HCC)   Lymphedema   Benign essential HTN   Severe mitral insufficiency   Chronic venous insufficiency   Gastrointestinal hemorrhage  Hospital Course: 89 y.o. female with medical history significant of atrial fibrillation on warfarin, HFpEF, severe mitral insufficiency, bilateral carotid artery disease, hypertension, chronic venous insufficiency, lymphedema admitted for anemia and weakness with concern for blood loss  5/13: GI, palliative care and vascular surgery consult  Assessment and Plan: * Symptomatic anemia Patient is presenting with symptomatic anemia in the setting of acute blood loss anemia of unknown source.  Her previous hemoglobins were in the mid 14, currently 6.6.  -GI seen and offered endoscopy but patient refused for now. May consider as an outpt -received total 2 PRBC transfusion this admission. - also seen by vascular per patient request  AF (paroxysmal atrial fibrillation) (HCC) - Hold home Eliquis in the setting of acute blood loss anemia for now. Patient/family understands the risks and benefits - Resume home metoprolol at lower dose of 25 mg twice a day  Lymphedema Benign essential HTN Chronic venous insufficiency follows with Dr. Gilda Crease in the office  Severe mitral insufficiency          Consultants: GI, Vascular surgery Disposition: Home health and Palliative care Diet recommendation:  Discharge Diet Orders (From admission, onward)     Start     Ordered   08/09/22 0000  Diet - low sodium heart healthy        08/09/22 1248            Carb modified diet DISCHARGE MEDICATION: Allergies as of 08/09/2022       Reactions   Ace Inhibitors Cough   Benadryl [diphenhydramine]    Urinary retention    Trazodone    Other reaction(s): Other (See Comments) Unable to urinate        Medication List     STOP taking these medications    Eliquis 2.5 MG Tabs tablet Generic drug: apixaban   polyethylene glycol 17 g packet Commonly known as: MIRALAX / GLYCOLAX       TAKE these medications    acetaminophen 500 MG tablet Commonly known as: TYLENOL Take 1,000 mg by mouth daily as needed for moderate pain.   cholecalciferol 25 MCG (1000 UNIT) tablet Commonly known as: VITAMIN D3 Take 2,000 Units by mouth daily. Notes to patient: Not given in the hospital   estradiol 0.1 MG/GM vaginal cream Commonly known as: ESTRACE Place 1 Applicatorful vaginally 3 (three) times a week. Notes to patient: Not given in the hospital   metoprolol tartrate 50 MG tablet Commonly known as: LOPRESSOR Take 50 mg by mouth 2 (two) times daily.   multivitamin with minerals Tabs tablet Take 1 tablet by mouth daily. Notes to patient: Not given in the hospital   potassium chloride 10 MEQ tablet Commonly known as: KLOR-CON Take 10 mEq by mouth daily.   psyllium 58.6 % packet Commonly known as: METAMUCIL Take 1 packet by mouth daily. Notes to patient: Not given in the hospital   torsemide 10 MG tablet Commonly known as: DEMADEX Take 20 mg by mouth  daily. Notes to patient: Not given in the hospital        Follow-up Information     [name], MD. Schedule an appointment as soon as possible for a visit in 1 week(s).   Specialty: Physician Assistant Why: [facility] Discharge F/UP Contact information: [address] [facility] [place] [address] [phone]         [name], MD. Schedule an appointment as soon as possible for a visit in 2 week(s).   Specialty: Gastroenterology Why:  [facility] Discharge F/UP Contact information: [address] [place] [address] [place] [address] [phone]                Discharge Exam: There were no vitals filed for this visit. 89 year old female lying in the bed comfortably without any acute distress Lungs clear to auscultation bilaterally Heart systolic ejection murmur at the left sternal border and apex Abdomen soft, benign Neuro alert and awake, nonfocal Psych normal mood and affect Extremities/skin lymphedema present in the lower extremities.  Patient's back has a large dressing/Mepilex with no obvious bleeding around it  Condition at discharge: fair  The results of significant diagnostics from this hospitalization (including imaging, microbiology, ancillary and laboratory) are listed below for reference.   Imaging Studies: No results found.  Microbiology: Results for orders placed or performed in visit on 05/03/22  Microscopic Examination     Status: Abnormal   Collection Time: 05/03/22  1:29 PM   Urine  Result Value Ref Range Status   WBC, UA >30 (A) 0 - 5 /hpf Final   RBC, Urine 11-30 (A) 0 - 2 /hpf Final   Epithelial Cells (non renal) 0-10 0 - 10 /hpf Final   Casts Present (A) None seen /lpf Final   Cast Type Hyaline casts N/A Final   Bacteria, UA Moderate (A) None seen/Few Final    Labs: CBC: Recent Labs  Lab 08/07/22 1221 08/07/22 1523 08/08/22 0516 08/08/22 1647 08/09/22 0422  WBC 6.8  --  7.8 9.1 7.8  HGB 7.6* 7.8* 6.6* 8.1* 7.4*  HCT 23.8* 24.4* 19.7* 24.5* 23.0*  MCV 102.1*  --  99.0 98.4 98.3  PLT 133*  --  124* 143* 125*   Basic Metabolic Panel: Recent Labs  Lab 08/07/22 1221 08/08/22 0516 08/09/22 0422  NA 135 139 136  K 3.5 3.6 3.3*  CL 100 102 102  CO2 28 27 29   GLUCOSE 147* 111* 103*  BUN 24* 23 24*  CREATININE 0.93 0.86 0.81  CALCIUM 8.7* 8.4* 8.4*   Liver Function Tests: No results for input(s): "AST", "ALT", "ALKPHOS", "BILITOT", "PROT", "ALBUMIN" in the  last 168 hours. CBG: No results for input(s): "GLUCAP" in the last 168 hours.  Discharge time spent: greater than 30 minutes.  Signed: Delfino Lovett, MD [facility] 08/10/2022

## 2022-08-11 ENCOUNTER — Telehealth: Payer: Self-pay

## 2022-08-11 NOTE — Telephone Encounter (Signed)
1547 Palliative Care Note  RN called Dr. Carmon Ginsberg office for order for Palliative care services at home. Left message for nurse.  Barbette Merino, RN

## 2022-08-16 ENCOUNTER — Telehealth: Payer: Self-pay

## 2022-08-16 NOTE — Telephone Encounter (Signed)
230 pm.  New Palliative Care Referral received for patient.  Phone call made to patient to schedule a home visit.  Call unsuccessful.  Message left requesting a call back.

## 2022-08-18 ENCOUNTER — Telehealth: Payer: Self-pay

## 2022-08-18 NOTE — Telephone Encounter (Signed)
Palliative care SW connected with patient to review new PC referral and criteria. Patient shared that she is not interested in PC at this. Hospice vs palliative care reviewed as well. Patient endorses she wishes to outreach PC when ready/open to pursue services. Patient has PC contact information.

## 2022-08-25 ENCOUNTER — Ambulatory Visit: Payer: Medicare PPO | Admitting: Physician Assistant

## 2022-11-03 ENCOUNTER — Ambulatory Visit (INDEPENDENT_AMBULATORY_CARE_PROVIDER_SITE_OTHER): Payer: Medicare PPO | Admitting: Vascular Surgery

## 2023-02-16 IMAGING — MG MM DIGITAL SCREENING BILAT W/ TOMO AND CAD
8 series · 9 of 24 positions shown · non-contrast
Comparison: Previous exam(s).

CLINICAL DATA: Screening.

EXAM:
DIGITAL SCREENING BILATERAL MAMMOGRAM WITH TOMOSYNTHESIS AND CAD
TECHNIQUE: Bilateral screening digital craniocaudal and mediolateral oblique
mammograms were obtained. Bilateral screening digital breast
tomosynthesis was performed. The images were evaluated with
computer-aided detection.

[R MLO synth-2D]
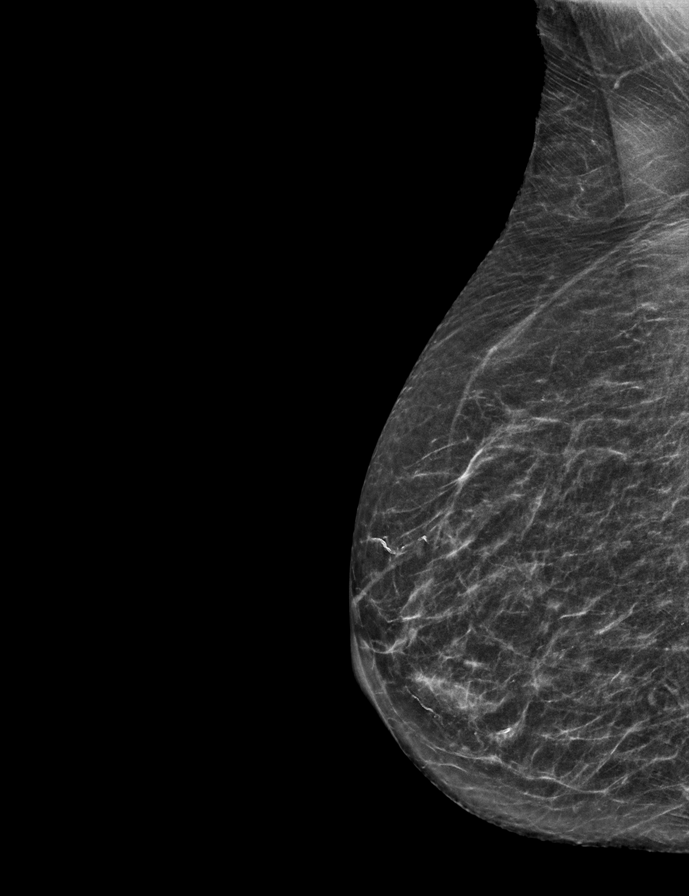

[L CC synth-2D]
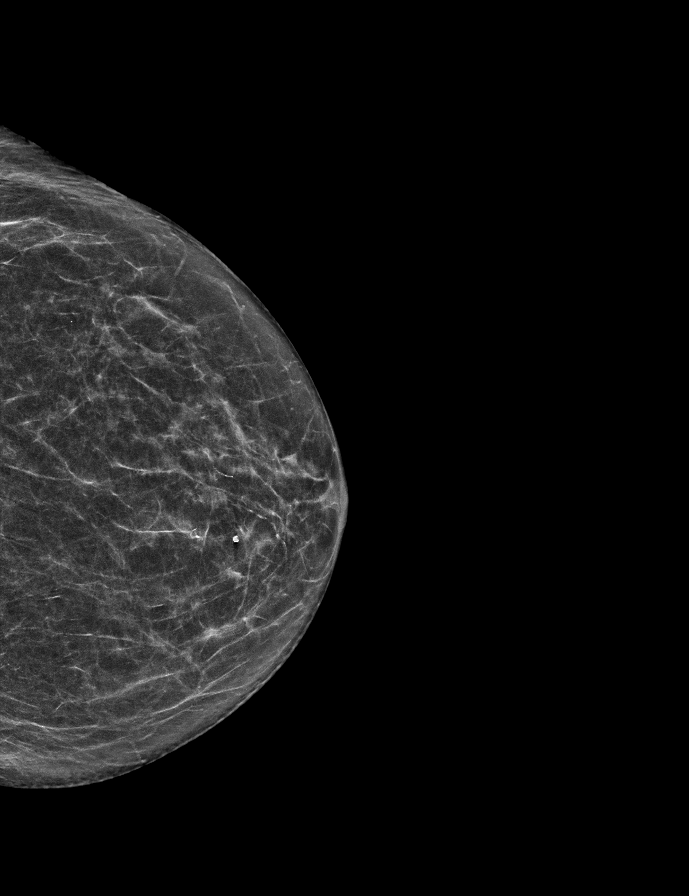

[L MLO synth-2D]
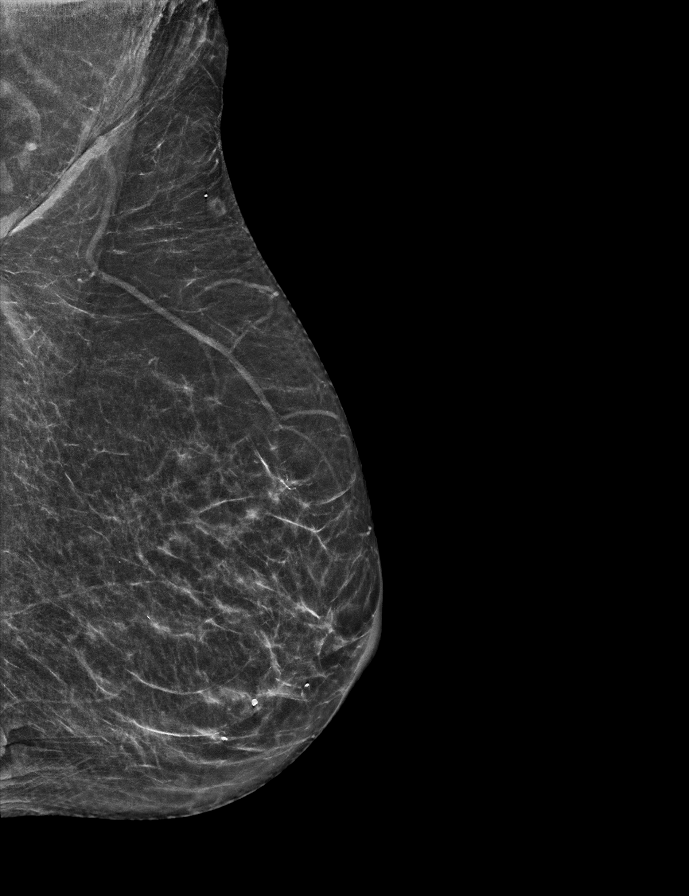

[R CC synth-2D]
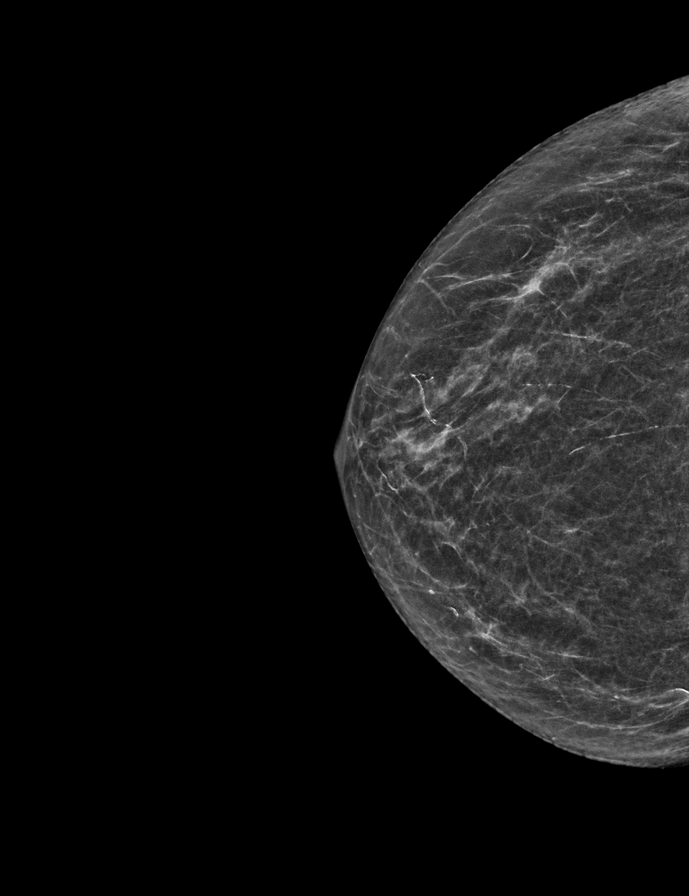

[L CC tomo · 2 of 54 frames shown]
[frame 18/54]
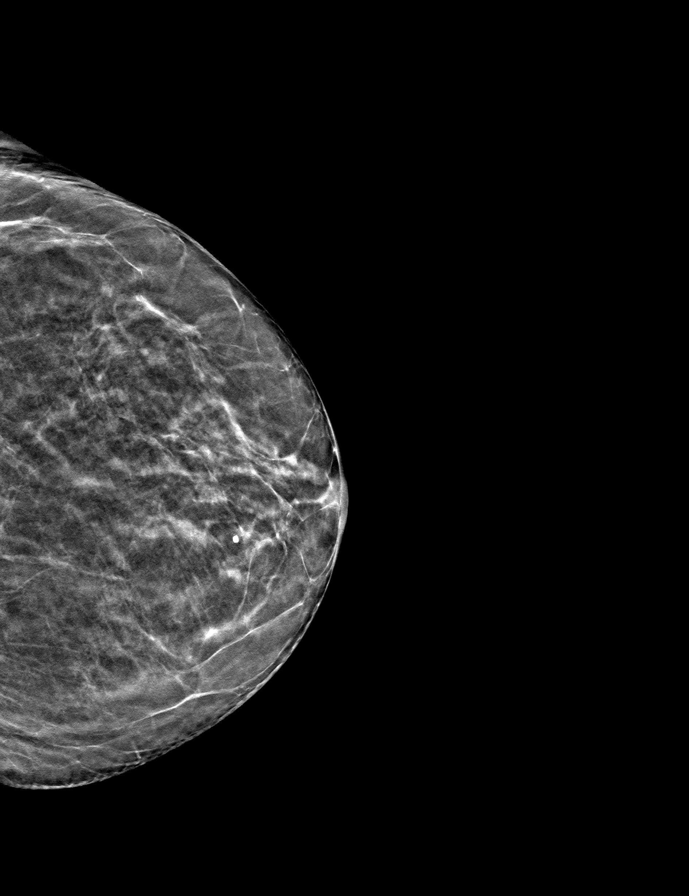
[frame 27/54]
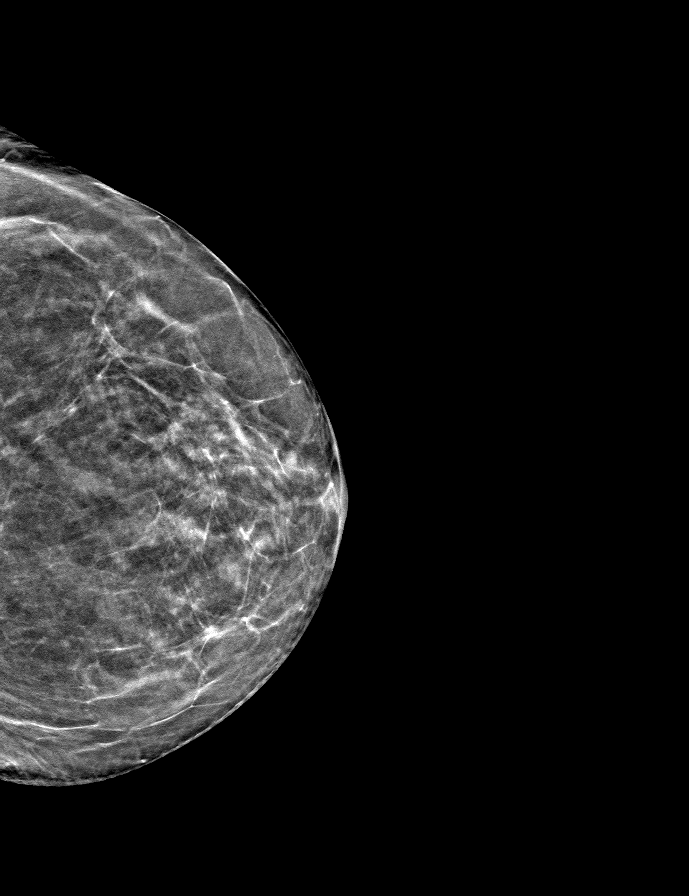

[L MLO tomo · tomo slice 27/52.0]
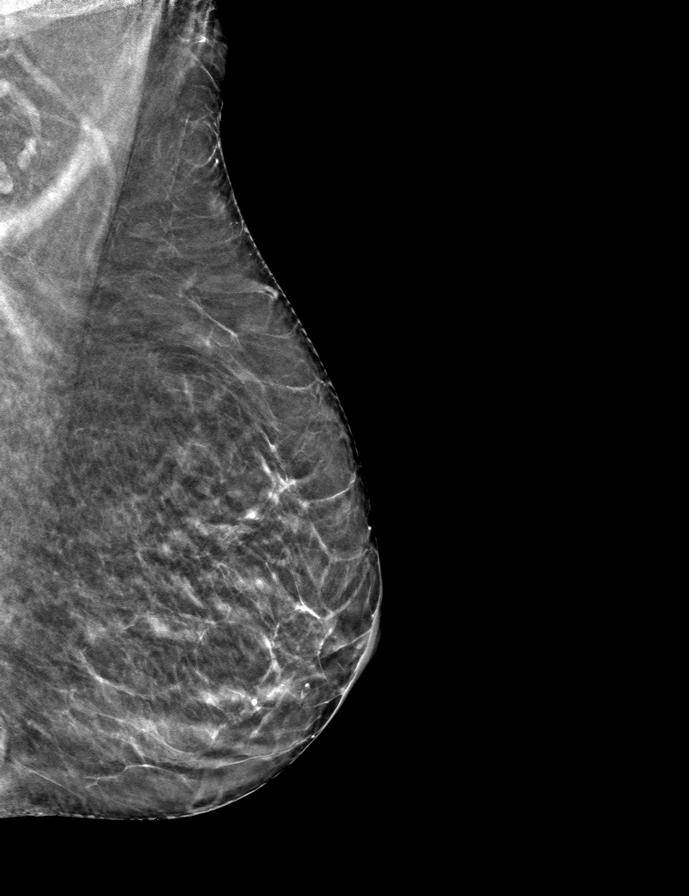

[R CC tomo · tomo slice 24/47.0]
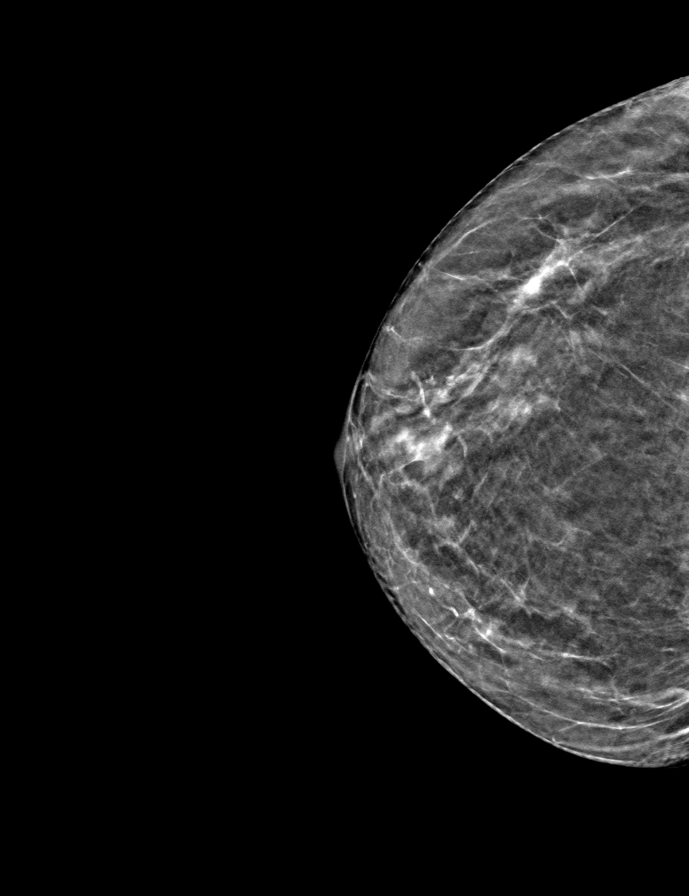

[R MLO tomo · tomo slice 26/51.0]
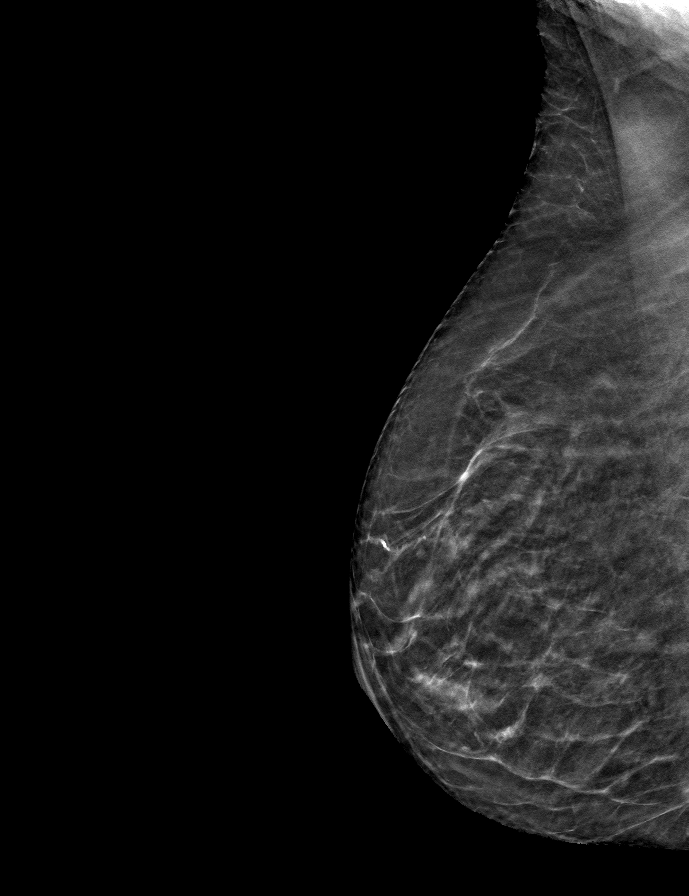

[9 of 24 positions shown; findings below may reference images not displayed]

ACR Breast Density Category b: There are scattered areas of
fibroglandular density.
FINDINGS: There are no findings suspicious for malignancy. The images were
evaluated with computer-aided detection.
IMPRESSION: No mammographic evidence of malignancy. A result letter of this
screening mammogram will be mailed directly to the patient.

RECOMMENDATION:
Screening mammogram in one year. (Code:[id number]-[id number])

BI-RADS CATEGORY  1: Negative.

## 2023-02-24 ENCOUNTER — Telehealth: Payer: Medicare PPO | Admitting: Physician Assistant

## 2023-02-24 DIAGNOSIS — R140 Abdominal distension (gaseous): Secondary | ICD-10-CM | POA: Diagnosis not present

## 2023-02-24 DIAGNOSIS — R064 Hyperventilation: Secondary | ICD-10-CM

## 2023-02-24 DIAGNOSIS — R600 Localized edema: Secondary | ICD-10-CM | POA: Diagnosis not present

## 2023-02-24 DIAGNOSIS — R0609 Other forms of dyspnea: Secondary | ICD-10-CM

## 2023-02-24 DIAGNOSIS — M545 Low back pain, unspecified: Secondary | ICD-10-CM

## 2023-02-24 DIAGNOSIS — K5904 Chronic idiopathic constipation: Secondary | ICD-10-CM

## 2023-02-24 NOTE — Progress Notes (Signed)
Virtual Visit Consent   Lisa [name], you are scheduled for a virtual visit with a [facility] provider today. Just as with appointments in the office, your consent must be obtained to participate. Your consent will be active for this visit and any virtual visit you may have with one of our providers in the next 365 days. If you have a MyChart account, a copy of this consent can be sent to you electronically.  As this is a virtual visit, video technology does not allow for your provider to perform a traditional examination. This may limit your provider's ability to fully assess your condition. If your provider identifies any concerns that need to be evaluated in person or the need to arrange testing (such as labs, EKG, etc.), we will make arrangements to do so. Although advances in technology are sophisticated, we cannot ensure that it will always work on either your end or our end. If the connection with a video visit is poor, the visit may have to be switched to a telephone visit. With either a video or telephone visit, we are not always able to ensure that we have a secure connection.  By engaging in this virtual visit, you consent to the provision of healthcare and authorize for your insurance to be billed (if applicable) for the services provided during this visit. Depending on your insurance coverage, you may receive a charge related to this service.  I need to obtain your verbal consent now. Are you willing to proceed with your visit today? Lisa [name] has provided verbal consent on 02/24/2023 for a virtual visit (video or telephone). Margaretann Loveless, PA-C  Date: 02/24/2023 6:35 PM  Virtual Visit via Video Note   I, Margaretann Loveless, connected with  Lisa [name]  ([id number], November 24, 1928) on 02/24/23 at  5:45 PM EST by a video-enabled telemedicine application and verified that I am speaking with the correct person using two identifiers. Daughters Darl Pikes and [place], and Son,  Cam, all were in attendance and helped patient with the visit.  Location: Patient: Virtual Visit Location Patient: Home Provider: Virtual Visit Location Provider: Home Office   I discussed the limitations of evaluation and management by telemedicine and the availability of in person appointments. The patient expressed understanding and agreed to proceed.    History of Present Illness: Lisa [name] is a 89 y.o. who identifies as a female who was assigned female at birth, and is being seen today for edema and labored breathing. Has been ongoing all week. Having increased lower leg edema with purplish discoloration, now increasing to have swelling in the abdomen with bloated feeling. Mild increase in labored breathing, especially with activity. Also, mentions that right hand is having some purplish discoloration to it as well.   PMH: Severe mitral valve insuff, atrial fib, chronic venous insuff, carotid stenosis bilat, APC, HTN, Lymphedema, and H/O GI bleed with anemia.  Currently on Torsemide 40mg  (split into 2-20mg  doses) and Klor-con twice daily.   Did wrap legs with lymphedema wraps, trying to elevate legs as well.  Having some mild low back pain that radiates to the hips.  Did have an episode of diarrhea. Had been having constipation and took a laxative. Diarrhea occurred after laxative.  Wt: was 139 pounds on Tuesday (02/21/23), today is 137 pounds   Problems:  Patient Active Problem List   Diagnosis Date Noted   Gastrointestinal hemorrhage 08/08/2022   Symptomatic anemia 08/07/2022   Chronic venous insufficiency 01/31/2022  Venous ulcer (HCC) 11/30/2021   Lymphedema 10/29/2021   Status post hip surgery 07/05/2017   Malnutrition of moderate degree 06/20/2017   Closed right hip fracture (HCC) 06/19/2017   Bilateral leg edema 05/14/2017   Intractable pain 11/06/2016   Cystocele, midline 04/28/2016   Incomplete bladder emptying 04/28/2016   Cystocele 02/12/2015    Vaginal enterocele 02/12/2015   Bilateral carotid artery stenosis 12/08/2014   Absolute anemia 11/13/2014   Benign essential HTN 11/13/2014   APC (atrial premature contractions) 11/13/2014   Awareness of heartbeats 11/13/2014   PVC (premature ventricular contraction) 11/13/2014   Arrhythmia, sinus node 11/13/2014   Combined fat and carbohydrate induced hyperlipemia 04/23/2014   Severe mitral insufficiency 04/23/2014   AF (paroxysmal atrial fibrillation) (HCC) 04/23/2014   Basal cell carcinoma of face 09/18/2012    Allergies:  Allergies  Allergen Reactions   Ace Inhibitors Cough   Benadryl [Diphenhydramine]     Urinary retention    Trazodone     Other reaction(s): Other (See Comments) Unable to urinate   Medications:  Current Outpatient Medications:    acetaminophen (TYLENOL) 500 MG tablet, Take 1,000 mg by mouth daily as needed for moderate pain., Disp: , Rfl:    cholecalciferol (VITAMIN D) 1000 units tablet, Take 2,000 Units by mouth daily. , Disp: , Rfl:    estradiol (ESTRACE) 0.1 MG/GM vaginal cream, Place 1 Applicatorful vaginally 3 (three) times a week., Disp: , Rfl:    metoprolol tartrate (LOPRESSOR) 50 MG tablet, Take 50 mg by mouth 2 (two) times daily., Disp: , Rfl:    Multiple Vitamin (MULTIVITAMIN WITH MINERALS) TABS tablet, Take 1 tablet by mouth daily., Disp: 30 tablet, Rfl: 2   potassium chloride (KLOR-CON) 10 MEQ tablet, Take 10 mEq by mouth daily., Disp: , Rfl:    psyllium (METAMUCIL) 58.6 % packet, Take 1 packet by mouth daily., Disp: , Rfl:    torsemide (DEMADEX) 10 MG tablet, Take 20 mg by mouth daily., Disp: , Rfl:   Observations/Objective: Patient is well-developed, well-nourished in no acute distress.  Resting comfortably at home with family.  Head is normocephalic, atraumatic.  No labored breathing.  Speech is clear and coherent with logical content.  Patient is alert and oriented at baseline.    Assessment and Plan: 1. Bilateral leg edema  2.  Bloated abdomen  3. Labored breathing  4. DOE (dyspnea on exertion)  5. Chronic idiopathic constipation  6. Acute midline low back pain without sciatica  - Increase Torsemide from 40mg  to 60mg  (20 mg three times daily) - Continue Potassium twice daily - Elevate legs - Continue lymphedema wraps - Try to move around, even small distances every hour or two while awake with assistance to help with back pain and venous insuff - Tylenol for back pain as needed - Stay hydrated - Increase dietary fiber for BM - Stool softener twice daily, limit laxative use - Follow up with PCP or Cardiologist early next week, discuss edema and Palliative care referral - Strict ER precautions verbally discussed with patient and family if swelling does not improve with increased fluid pills or breathing worsens she should be seen immediately   Follow Up Instructions: I discussed the assessment and treatment plan with the patient. The patient was provided an opportunity to ask questions and all were answered. The patient agreed with the plan and demonstrated an understanding of the instructions.  A copy of instructions were sent to the patient via MyChart unless otherwise noted below.    The  patient was advised to call back or seek an in-person evaluation if the symptoms worsen or if the condition fails to improve as anticipated.    Margaretann Loveless, PA-C

## 2023-02-24 NOTE — Patient Instructions (Signed)
  Lisa [name], thank you for joining Margaretann Loveless, PA-C for today's virtual visit.  While this provider is not your primary care provider (PCP), if your PCP is located in our provider database this encounter information will be shared with them immediately following your visit.   A [facility] MyChart account gives you access to today's visit and all your visits, tests, and labs performed at [facility] " click here if you don't have a [facility] MyChart account or go to [email or web address]  Consent: (Patient) Lisa [name] provided verbal consent for this virtual visit at the beginning of the encounter.  Current Medications:  Current Outpatient Medications:    acetaminophen (TYLENOL) 500 MG tablet, Take 1,000 mg by mouth daily as needed for moderate pain., Disp: , Rfl:    cholecalciferol (VITAMIN D) 1000 units tablet, Take 2,000 Units by mouth daily. , Disp: , Rfl:    estradiol (ESTRACE) 0.1 MG/GM vaginal cream, Place 1 Applicatorful vaginally 3 (three) times a week., Disp: , Rfl:    metoprolol tartrate (LOPRESSOR) 50 MG tablet, Take 50 mg by mouth 2 (two) times daily., Disp: , Rfl:    Multiple Vitamin (MULTIVITAMIN WITH MINERALS) TABS tablet, Take 1 tablet by mouth daily., Disp: 30 tablet, Rfl: 2   potassium chloride (KLOR-CON) 10 MEQ tablet, Take 10 mEq by mouth daily., Disp: , Rfl:    psyllium (METAMUCIL) 58.6 % packet, Take 1 packet by mouth daily., Disp: , Rfl:    torsemide (DEMADEX) 10 MG tablet, Take 20 mg by mouth daily., Disp: , Rfl:    Medications ordered in this encounter:  No orders of the defined types were placed in this encounter.    *If you need refills on other medications prior to your next appointment, please contact your pharmacy*  Follow-Up: Call back or seek an in-person evaluation if the symptoms worsen or if the condition fails to improve as anticipated.  [facility] [phone]  Other Instructions - Increase  Torsemide from 40mg  to 60mg  (20 mg three times daily) - Continue Potassium twice daily - Elevate legs - Continue lymphedema wraps - Try to move around, even small distances every hour or two while awake with assistance to help with back pain and venous insuff - Tylenol for back pain as needed - Stay hydrated - Increase dietary fiber for BM - Stool softener twice daily, limit laxative use - Follow up with PCP or Cardiologist early next week, discuss edema and Palliative care referral - Strict ER precautions verbally discussed with patient and family if swelling does not improve with increased fluid pills or breathing worsens she should be seen immediately   If you have been instructed to have an in-person evaluation today at a local Urgent Care facility, please use the link below. It will take you to a list of all of our available [facility], including address, phone number and hours of operation. Please do not delay care.  [facility]  If you or a family member do not have a primary care provider, use the link below to schedule a visit and establish care. When you choose a [facility] primary care physician or advanced practice provider, you gain a long-term partner in health. Find a Primary Care Provider  Learn more about [facility]'s in-office and virtual care options: [facility] - Get Care Now

## 2023-02-27 ENCOUNTER — Other Ambulatory Visit
Admission: RE | Admit: 2023-02-27 | Discharge: 2023-02-27 | Disposition: A | Discharge status: Home / Self Care | Payer: Medicare PPO | Source: Ambulatory Visit | Attending: Internal Medicine | Admitting: Internal Medicine

## 2023-02-27 DIAGNOSIS — R0602 Shortness of breath: Secondary | ICD-10-CM | POA: Insufficient documentation

## 2023-02-27 DIAGNOSIS — R609 Edema, unspecified: Secondary | ICD-10-CM | POA: Diagnosis present

## 2023-02-27 LAB — BRAIN NATRIURETIC PEPTIDE: B Natriuretic Peptide: 1009.1 pg/mL — ABNORMAL HIGH (ref 0.0–100.0)

## 2023-05-09 ENCOUNTER — Ambulatory Visit: Payer: Medicare PPO | Admitting: Physician Assistant

## 2023-07-27 DEATH — deceased

## 2023-08-15 ENCOUNTER — Encounter (INDEPENDENT_AMBULATORY_CARE_PROVIDER_SITE_OTHER): Payer: Self-pay
